# Patient Record
Sex: Female | Born: 1960 | Race: White | Hispanic: No | Smoking: Never smoker
Health system: Southern US, Community
[De-identification: ages and names within clinical notes are randomized; demographics above are authoritative.]

## PROBLEM LIST (undated history)

## (undated) DIAGNOSIS — F329 Major depressive disorder, single episode, unspecified: Secondary | ICD-10-CM

## (undated) DIAGNOSIS — M722 Plantar fascial fibromatosis: Secondary | ICD-10-CM

## (undated) DIAGNOSIS — N950 Postmenopausal bleeding: Secondary | ICD-10-CM

## (undated) DIAGNOSIS — K219 Gastro-esophageal reflux disease without esophagitis: Secondary | ICD-10-CM

## (undated) DIAGNOSIS — F419 Anxiety disorder, unspecified: Secondary | ICD-10-CM

## (undated) DIAGNOSIS — R609 Edema, unspecified: Secondary | ICD-10-CM

## (undated) DIAGNOSIS — N840 Polyp of corpus uteri: Secondary | ICD-10-CM

## (undated) DIAGNOSIS — H8110 Benign paroxysmal vertigo, unspecified ear: Secondary | ICD-10-CM

## (undated) DIAGNOSIS — IMO0001 Reserved for inherently not codable concepts without codable children: Secondary | ICD-10-CM

## (undated) DIAGNOSIS — D649 Anemia, unspecified: Secondary | ICD-10-CM

## (undated) DIAGNOSIS — R0600 Dyspnea, unspecified: Secondary | ICD-10-CM

## (undated) DIAGNOSIS — K625 Hemorrhage of anus and rectum: Secondary | ICD-10-CM

## (undated) DIAGNOSIS — K648 Other hemorrhoids: Secondary | ICD-10-CM

## (undated) DIAGNOSIS — G4700 Insomnia, unspecified: Secondary | ICD-10-CM

## (undated) DIAGNOSIS — K644 Residual hemorrhoidal skin tags: Secondary | ICD-10-CM

## (undated) DIAGNOSIS — D219 Benign neoplasm of connective and other soft tissue, unspecified: Secondary | ICD-10-CM

## (undated) DIAGNOSIS — F32A Depression, unspecified: Secondary | ICD-10-CM

## (undated) DIAGNOSIS — G43909 Migraine, unspecified, not intractable, without status migrainosus: Secondary | ICD-10-CM

## (undated) DIAGNOSIS — R7303 Prediabetes: Secondary | ICD-10-CM

## (undated) HISTORY — DX: Major depressive disorder, single episode, unspecified: F32.9

## (undated) HISTORY — PX: HYSTEROSCOPY: SHX211

## (undated) HISTORY — PX: OTHER SURGICAL HISTORY: SHX169

## (undated) HISTORY — DX: Reserved for inherently not codable concepts without codable children: IMO0001

## (undated) HISTORY — DX: Prediabetes: R73.03

## (undated) HISTORY — DX: Benign paroxysmal vertigo, unspecified ear: H81.10

## (undated) HISTORY — DX: Migraine, unspecified, not intractable, without status migrainosus: G43.909

## (undated) HISTORY — DX: Anxiety disorder, unspecified: F41.9

## (undated) HISTORY — DX: Insomnia, unspecified: G47.00

## (undated) HISTORY — DX: Plantar fascial fibromatosis: M72.2

## (undated) HISTORY — PX: DILATION AND CURETTAGE OF UTERUS: SHX78

## (undated) HISTORY — DX: Gastro-esophageal reflux disease without esophagitis: K21.9

## (undated) HISTORY — PX: COLONOSCOPY: SHX174

## (undated) HISTORY — DX: Depression, unspecified: F32.A

---

## 2002-05-16 ENCOUNTER — Emergency Department (HOSPITAL_COMMUNITY): Admission: EM | Admit: 2002-05-16 | Discharge: 2002-05-16 | Payer: Self-pay | Admitting: Emergency Medicine

## 2004-08-26 ENCOUNTER — Emergency Department (HOSPITAL_COMMUNITY): Admission: EM | Admit: 2004-08-26 | Discharge: 2004-08-26 | Payer: Self-pay | Admitting: Emergency Medicine

## 2007-05-17 ENCOUNTER — Emergency Department (HOSPITAL_COMMUNITY): Admission: EM | Admit: 2007-05-17 | Discharge: 2007-05-17 | Payer: Self-pay | Admitting: Emergency Medicine

## 2007-08-21 ENCOUNTER — Encounter: Payer: Self-pay | Admitting: Cardiovascular Disease

## 2007-10-09 ENCOUNTER — Ambulatory Visit (HOSPITAL_COMMUNITY): Admission: RE | Admit: 2007-10-09 | Discharge: 2007-10-09 | Payer: Self-pay | Admitting: Cardiovascular Disease

## 2007-10-09 ENCOUNTER — Ambulatory Visit: Payer: Self-pay | Admitting: Cardiovascular Disease

## 2007-10-21 ENCOUNTER — Ambulatory Visit: Payer: Self-pay | Admitting: Gastroenterology

## 2008-12-03 DIAGNOSIS — R0602 Shortness of breath: Secondary | ICD-10-CM | POA: Insufficient documentation

## 2008-12-03 DIAGNOSIS — R609 Edema, unspecified: Secondary | ICD-10-CM | POA: Insufficient documentation

## 2009-07-14 ENCOUNTER — Ambulatory Visit (HOSPITAL_COMMUNITY): Admission: RE | Admit: 2009-07-14 | Discharge: 2009-07-14 | Payer: Self-pay | Admitting: Family Medicine

## 2009-07-20 ENCOUNTER — Encounter (INDEPENDENT_AMBULATORY_CARE_PROVIDER_SITE_OTHER): Payer: Self-pay | Admitting: *Deleted

## 2009-08-22 ENCOUNTER — Ambulatory Visit: Payer: Self-pay | Admitting: Internal Medicine

## 2009-08-22 DIAGNOSIS — K648 Other hemorrhoids: Secondary | ICD-10-CM | POA: Insufficient documentation

## 2009-08-22 DIAGNOSIS — K219 Gastro-esophageal reflux disease without esophagitis: Secondary | ICD-10-CM | POA: Insufficient documentation

## 2009-08-22 DIAGNOSIS — R1310 Dysphagia, unspecified: Secondary | ICD-10-CM | POA: Insufficient documentation

## 2009-08-24 ENCOUNTER — Other Ambulatory Visit: Admission: RE | Admit: 2009-08-24 | Discharge: 2009-08-24 | Payer: Self-pay | Admitting: Obstetrics and Gynecology

## 2010-05-02 ENCOUNTER — Ambulatory Visit (HOSPITAL_COMMUNITY): Admission: RE | Admit: 2010-05-02 | Payer: Self-pay | Admitting: Family Medicine

## 2010-07-08 ENCOUNTER — Encounter: Payer: Self-pay | Admitting: Family Medicine

## 2010-07-18 NOTE — Letter (Signed)
Summary: New Patient letter  [facility]  [address] [place], [place] [address]   Phone: [phone]  Fax: [phone]       07/20/2009 MRN: [id number]  Adriana [name] [address] [place], [place]  [address]  Dear Adriana [name],  Welcome to the Gastroenterology Division at [facility].    You are scheduled to see Dr.  Marina Goodell on 08-22-09 at 9:30a.m. on the 3rd floor at [facility], [address]  We ask that you try to arrive at our office 15 minutes prior to your appointment time to allow for check-in.  We would like you to complete the enclosed self-administered evaluation form prior to your visit and bring it with you on the day of your appointment.  We will review it with you.  Also, please bring a complete list of all your medications or, if you prefer, bring the medication bottles and we will list them.  Please bring your insurance card so that we may make a copy of it.  If your insurance requires a referral to see a specialist, please bring your referral form from your primary care physician.  Co-payments are due at the time of your visit and may be paid by cash, check or credit card.     Your office visit will consist of a consult with your physician (includes a physical exam), any laboratory testing he/she may order, scheduling of any necessary diagnostic testing (e.g. x-ray, ultrasound, CT-scan), and scheduling of a procedure (e.g. Endoscopy, Colonoscopy) if required.  Please allow enough time on your schedule to allow for any/all of these possibilities.    If you cannot keep your appointment, please call [phone] to cancel or reschedule prior to your appointment date.  This allows [place] the opportunity to schedule an appointment for another patient in need of care.  If you do not cancel or reschedule by 5 p.m. the business day prior to your appointment date, you will be charged a $50.00 late cancellation/no-show fee.    Thank you for  choosing [facility] for your medical needs.  We appreciate the opportunity to care for you.  Please visit [place] at our website  to learn more about our practice.                     Sincerely,                                                             The Gastroenterology Division

## 2010-07-18 NOTE — Letter (Signed)
Summary: [facility] Instructions  [facility]  [address] [place], [place] [address]   Phone: [phone]  Fax: [phone]       Adriana [name]    Dec 02, 1960    MRN: [id number]        Procedure Day /Date:MONDAY, 09/26/09     Arrival Time:1:30 PM     Procedure Time:2:30 PM     Location of Procedure:                    [facility] (4th Floor)   PREPARATION FOR COLONOSCOPY WITH MOVIPREP/ENDO   Starting 5 days prior to your procedure 09/22/09 do not eat nuts, seeds, popcorn, corn, beans, peas,  salads, or any raw vegetables.  Do not take any fiber supplements (e.g. Metamucil, Citrucel, and Benefiber).  THE DAY BEFORE YOUR PROCEDURE         DATE: 09/25/09 DAY: SUNDAY  1.  Drink clear liquids the entire day-NO SOLID FOOD  2.  Do not drink anything colored red or purple.  Avoid juices with pulp.  No orange juice.  3.  Drink at least 64 oz. (8 glasses) of fluid/clear liquids during the day to prevent dehydration and help the prep work efficiently.  CLEAR LIQUIDS INCLUDE: Water Jello Ice Popsicles Tea (sugar ok, no milk/cream) Powdered fruit flavored drinks Coffee (sugar ok, no milk/cream) Gatorade Juice: apple, white grape, white cranberry  Lemonade Clear bullion, consomm, broth Carbonated beverages (any kind) Strained chicken noodle soup Hard Candy                             4.  In the morning, mix first dose of MoviPrep solution:    Empty 1 Pouch A and 1 Pouch B into the disposable container    Add lukewarm drinking water to the top line of the container. Mix to dissolve    Refrigerate (mixed solution should be used within 24 hrs)  5.  Begin drinking the prep at 5:00 p.m. The MoviPrep container is divided by 4 marks.   Every 15 minutes drink the solution down to the next mark (approximately 8 oz) until the full liter is complete.   6.  Follow completed prep with 16 oz of clear liquid of your choice (Nothing red or purple).   Continue to drink clear liquids until bedtime.  7.  Before going to bed, mix second dose of MoviPrep solution:    Empty 1 Pouch A and 1 Pouch B into the disposable container    Add lukewarm drinking water to the top line of the container. Mix to dissolve    Refrigerate  THE DAY OF YOUR PROCEDURE      DATE: 09/26/09 DAY: MONDAY  Beginning at 9:30 a.m. (5 hours before procedure):         1. Every 15 minutes, drink the solution down to the next mark (approx 8 oz) until the full liter is complete.  2. Follow completed prep with 16 oz. of clear liquid of your choice.    3. You may drink clear liquids until12:30 PM (2 HOURS BEFORE PROCEDURE).   MEDICATION INSTRUCTIONS  Unless otherwise instructed, you should take regular prescription medications with a small sip of water   as early as possible the morning of your procedure.           OTHER INSTRUCTIONS  You will need a responsible adult at least 48 years of age to  accompany you and drive you home.   This person must remain in the waiting room during your procedure.  Wear loose fitting clothing that is easily removed.  Leave jewelry and other valuables at home.  However, you may wish to bring a book to read or  an iPod/MP3 player to listen to music as you wait for your procedure to start.  Remove all body piercing jewelry and leave at home.  Total time from sign-in until discharge is approximately 2-3 hours.  You should go home directly after your procedure and rest.  You can resume normal activities the  day after your procedure.  The day of your procedure you should not:   Drive   Make legal decisions   Operate machinery   Drink alcohol   Return to work  You will receive specific instructions about eating, activities and medications before you leave.    The above instructions have been reviewed and explained to me by   _______________________    I fully understand and can verbalize these instructions  _____________________________ Date _________

## 2010-07-18 NOTE — Assessment & Plan Note (Signed)
Summary: dysphagia--ch.   History of Present Illness Visit Type: Initial Consult Primary GI MD: Yancey Flemings MD Primary Provider: Karleen Hampshire, MD Requesting Provider: Karleen Hampshire, MD Chief Complaint: Patient complains that it feels like something is stuck in her throat. She states that she was dx with this globus sensation in 1993 after her divorce but it was better for a short amount of time but has since got worse. She is states that she has some epigastric pain. The sensation wakes her from her sleep. She also complains of some hemorrhoids and some rectal bleeding from time to time.  History of Present Illness:   50 year old female with a history of anxiety/depression and chronic headaches. She presents today regarding globus sensation and dysphagia. Also, complains about hemorrhoids. She reports an 8 year history of a sensation of "something stuck in her throat" that occurs with or without meals. She was diagnosed previously with globus sensation felt secondary to anxiety. Problem resolved but has returned over the past year. In addition to this symptom, she reports several month history of acid reflux as manifested by pyrosis and chest discomfort as well as epigastric burning. Symptoms are exacerbated by certain food items. No obvious relieving factors. She does report mild intermittent solid food dysphagia to items such as [place] fries. No change in weight. Finally, problems with hemorrhoids as manifested by swelling, difficulty cleaning, and minor bleeding. Her father was diagnosed with colon cancer in his 49s. The patient has not been screened for colon cancer or undergone prior upper endoscopy.Marland Kitchen   GI Review of Systems    Reports abdominal pain, acid reflux, chest pain, and  dysphagia with solids.     Location of  Abdominal pain: epigastric area.    Denies belching, bloating, dysphagia with liquids, heartburn, loss of appetite, nausea, vomiting, vomiting blood, weight loss, and  weight  gain.      Reports hemorrhoids and  rectal bleeding.     Denies anal fissure, black tarry stools, change in bowel habit, constipation, diarrhea, diverticulosis, fecal incontinence, heme positive stool, irritable bowel syndrome, jaundice, light color stool, liver problems, and  rectal pain.    Current Medications (verified): 1)  Astragalus Root  Tab .... Take One Tablet By Mouth Once A Week 2)  Turmeric Tab .... Take One By Mouth As Needed 3)  Kelp 225 Mcg Tabs (Iodine (Kelp)) .... Take One By Mouth As Needed 4)  Aspirin 325 Mg Tabs (Aspirin) .... Take One By Mouth As Needed 5)  Excedrin Pm 500-38 Mg Tabs (Diphenhydramine-Apap (Sleep)) .... Take One By Mouth As Needed  Allergies (verified): No Known Drug Allergies  Past History:  Past Medical History: DYSPNEA (ICD-786.05) EDEMA (ICD-782.3)  Family Hx. of Colon Cancer insomina Anemia Anxiety Disorder headaches Depression  Past Surgical History: Unremarkable  Family History: Family History of Colon Cancer:Father Family History of Heart Disease: father Sister deceased of anersym  Social History: Full Time - city of [place] Divorced  Tobacco Use - No.  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  She is divorced. She has one dauhter.  She is, otherwise, fairly sedentary. She does not drink or smoke.   Review of Systems       The patient complains of allergy/sinus, anxiety-new, change in vision, depression-new, fatigue, headaches-new, shortness of breath, sleeping problems, swelling of feet/legs, thirst - excessive, urination - excessive, and voice change.  The patient denies anemia, arthritis/joint pain, back pain, blood in urine, breast changes/lumps, confusion, cough, coughing up  blood, fainting, fever, hearing problems, heart murmur, heart rhythm changes, itching, menstrual pain, muscle pains/cramps, night sweats, nosebleeds, pregnancy symptoms, skin rash, sore throat, swollen lymph glands, urination  changes/pain, urine leakage, and vision changes.    Vital Signs:  Patient profile:   50 year old female Height:      67 inches Weight:      245.4 pounds BMI:     38.57 Pulse rate:   88 / minute Pulse rhythm:   regular BP sitting:   100 / 60  (left arm) Cuff size:   regular  Vitals Entered By: Harlow Mares CMA Duncan Dull) (August 22, 2009 9:59 AM)  Physical Exam  General:  Well developed, well nourished, no acute distress. Head:  Normocephalic and atraumatic. Eyes:  PERRLA, no icterus. Ears:  Normal auditory acuity. Nose:  No deformity, discharge,  or lesions. Mouth:  No deformity or lesions, dentition normal. Neck:  Supple; no masses or thyromegaly. Lungs:  Clear throughout to auscultation. Heart:  Regular rate and rhythm; no murmurs, rubs,  or bruits. Abdomen:  Soft, nontender and nondistended. No masses, hepatosplenomegaly or hernias noted. Normal bowel sounds. Rectal:  deferred until colonoscopy Msk:  Symmetrical with no gross deformities. Normal posture. Pulses:  Normal pulses noted. Extremities:  No clubbing, cyanosis, edema or deformities noted. Neurologic:  Alert and  oriented x4;  grossly normal neurologically. Skin:  Intact without significant lesions or rashes. Cervical Nodes:  No significant cervical adenopathy.no supraclavicular adenopathy Psych:  Alert and cooperative. Normal mood and affect.   Impression & Recommendations:  Problem # 1:  GERD (ICD-530.81) several month history of pyrosis, epigastric and chest burning consistent with GERD.  Plan: #1. Reflux precautions #2. Literature on GERD and reflux precautions sheet provided #3. Prescribed Nexium 40 mg daily. In addition to the submitted prescription, samples provided  Problem # 2:  DYSPHAGIA UNSPECIFIED (ICD-787.20) the patient does have a globus type sensation. This may be secondary to GERD, or anxiety. She also has true intermittent esophageal dysphagia to solids. Rule out peptic stricture or esophageal  edema  Plan: #1. Upper endoscopy with possible esophageal dilation. The nature of the procedure as well as the risks, benefits, and alternatives were reviewed. She understood and agreed to proceed  Problem # 3:  HEMORRHOIDS, INTERNAL (ICD-455.0) problems with hemorrhoids.  Plan #1. Hemorrhoid care information sheet reviewed and provided  Problem # 4:  FM HX MALIGNANT NEOPLASM GASTROINTESTINAL TRACT (ICD-V16.0) family history of colon cancer in her father in his 64s.  Plan:  #1. Screening colonoscopy. The nature of the procedure as well as the risks, benefits, and alternatives were reviewed. She understood and agreed to proceed #2. Movi prep prescribed. The patient instructed on its use  Other Orders: Colon/Endo (Colon/Endo)  Patient Instructions: 1)  Nexium 40 mg samples and Rx. sent to your pharmacy #30 x 11 RFs. take 1 capsule by mouth once daily 2)  Colon/Endo scheduled [facility] 09/26/09 2:30 pm to arrive at 1:30 3)  Movi prep instructions given to patient and Rx. sent to pharmacy for patient to pick up. 4)  Colonoscopy and Flexible Sigmoidoscopy brochure given.  5)  GI Reflux brochure given. 6)  Hemorrhoids brochure given with rectal care instructions. 7)  Upper Endoscopy brochure given.  8)  The medication list was reviewed and reconciled.  All changed / newly prescribed medications were explained.  A complete medication list was provided to the patient / caregiver. 9)  Copy: Earma Reading [facility], [address], [place],  [place] [address]) Prescriptions: NEXIUM 40 MG CPDR (ESOMEPRAZOLE MAGNESIUM) 1 capsule by mouth once daily  #30 x 11   Entered by:   Milford Cage NCMA   Authorized by:   Hilarie Fredrickson MD   Signed by:   Milford Cage NCMA on 08/22/2009   Method used:   Electronically to        [facility]. [phone]* (retail)       [address] [place], [place]  [address]       Ph: [phone]       Fax: [phone]   RxID:    [id number] MOVIPREP 100 GM  SOLR (PEG-KCL-NACL-NASULF-NA ASC-C) As per prep instructions.  #1 x 0   Entered by:   Milford Cage NCMA   Authorized by:   Hilarie Fredrickson MD   Signed by:   Milford Cage NCMA on 08/22/2009   Method used:   Electronically to        [facility]. [phone]* (retail)       [address] [place]  [address]       Ph: [phone]       Fax: [phone]   RxID:   [id number]

## 2010-10-31 NOTE — Assessment & Plan Note (Signed)
[facility]                       [facility] OFFICE NOTE   Adriana [name], Adriana [name]                   MRN:          [id number]  DATE:10/09/2007                            DOB:          1960-10-27    A 47-year patient with chest pain, dyspnea, and lower extremity edema.   The patient has no documented previous cardiopulmonary problems.  For  about a year she has had dyspnea.  She has gained about 25 pounds.  However, when she walks with a girlfriend she feels she is more dyspneic  than she should be.  She also gets squeezing pain in her chest.  They  are usually related to exertion.   The patient has noticed some lower extremity edema as well.  She has  gained weight.  She does not watch her diet very well or her salt  intake.   There has been no history of chronic pulmonary disease.  She is a  nonsmoker with no evidence of previous asthma.  No cough, no sputum  production.  Her dyspnea has been progressive over the last year.   There has been no initial workup, and she tells me she has not had a  chest x-ray in a year.   In regards to her heart the squeezing pressure is associated with her  dyspnea.  There is no diaphoresis, no palpitations, and no syncope.   She tends not to get the squeezing pressure in her chest without the  dyspnea.   Again most of her symptoms seem to be exertional.   REVIEW OF SYSTEMS:  Otherwise, negative.   PAST MEDICAL HISTORY:  Benign.   PAST SURGICAL HISTORY:  She has not had previous surgery.   ALLERGIES:  She has no known allergies.   MEDICATIONS:  She does not take any medicines on a routine basis.   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  She is divorced.  She has one child 17 years ago.  She is, otherwise, fairly sedentary.  She does not drink or smoke.   FAMILY HISTORY:  Remarkable for father dying of colon cancer.  She did  have an older sister who had an aneurysm in her  head and died at the age  of 57.   PHYSICAL EXAMINATION:  GENERAL:  Remarkable for an overweight white  female in no distress.  VITAL SIGNS:  Weight 246, blood pressure 110/80, pulse 84 and regular,  respiratory rate 14, afebrile.  HEENT:  Unremarkable.  NECK:  Carotids normal without bruit.  No lymphadenopathy, thyromegaly,  JVP elevation.  LUNGS:  Clear, good diaphragmatic motion.  No wheezing, S1, S2 with  normal heart sounds.  PMI normal.  ABDOMEN:  Benign, bowel sounds positive.  No AAA, no tenderness.  No  hepatosplenomegaly, hepatojugular reflux.  Distal pulses are intact with  trace edema.  NEUROLOGICAL:  Nonfocal.  SKIN:  Warm and dry.  No muscle weakness.   EKG shows sinus rhythm with poor R wave progression and low voltage   IMPRESSION:  1. Dyspnea.  Check 2-D echocardiogram.  Given her low voltage rule out  right ventricular or left ventricular dysfunction or pericardial      effusion.  I need a baseline chest x-ray just to see what her chest      and lungs look like.  Her lung exam is normal.  There is no obvious      history of asthma or of pulmonary problems.  We will check a 2-D      echocardiogram as well as her chest x-ray as an initial screen for      cardiopulmonary causes of dyspnea.  2. Chest pain likely related to her shortness of breath, follow-up      stress Myoview.  This will allow me to see what her blood pressure      response to exercise is.  We may check her oxygen saturations when      she walks and have a further idea of any pulmonary hypertension by      echocardiogram.  3. Lower extremity edema functional.  Elevate legs at the end of the      day, low salt diet.  4. One of the major issues with the patient is need for weight loss.      She needs to see a nutritionist and counselor.  Further follow-up      testing such as CT scanning may be indicated.  However, for the      time being we will check her echocardiogram, chest x-ray, and       stress Myoview, and make recommendations after that.  I think the      chance of finding significant cardiopulmonary disease is low.     Noralyn Pick. Eden Emms, MD, [facility]  Electronically Signed    PCN/MedQ  DD: 10/09/2007  DT: 10/09/2007  Job #: [id number]   cc:   [facility], Lewayne Bunting

## 2010-10-31 NOTE — Consult Note (Signed)
NAME:  Adriana [name], Adriana [name]            ACCOUNT NO.:  [id number]   MEDICAL RECORD NO.:  [id number]          PATIENT TYPE:  AMB   LOCATION:  DAY                           FACILITY:  [facility]   PHYSICIAN:  R. Roetta Sessions, M.D. DATE OF BIRTH:  1960/11/29   DATE OF CONSULTATION:  10/21/2007  DATE OF DISCHARGE:                                 CONSULTATION   REASON FOR CONSULTATION:  Difficulty swallowing, anemia, hemorrhoids,  consult for colonoscopy.   PHYSICIAN REQUESTING CONSULTATION:  Dr. Bunnie Pion. PA with Dr. Reynolds Bowl.   HISTORY OF PRESENT ILLNESS:  The patient is a 50 year old Caucasian  female patient of  Dr. Jorene Guest who presents today for further  evaluation of above-stated symptoms.  She actually was seen by Dr. Loreta Ave  back in 2004 when she presented with constipation and hematochezia.  He  had recommended a colonoscopy; however, she cancelled.  She states she  was afraid she will not be able to tolerate the preop.  She has had  chronic constipation since that time.  She has been able to manage  fairly well with her diet.  If she does not consume a high fiber diet,  she has to take laxatives.  She has seen intermittent bright red blood,  but none recently.  She has a history of hemorrhoids, which have been  causing her some difficulty.  She also has been heavy menses over the  last couple of years, which last 2-3 days at a time and have been more  irregular lately.  She was recently found to have mild anemia with  hemoglobin of 11.1.  She reports being heme-negative on digital rectal  examination recently by Dr. Bunnie Pion, [facility], who is at [facility].  She denies any diarrhea.  Denies any abdominal pain.  She complains of chronic globus.  She feels like there is a lump in her  throat at all times.  She now has been having more symptoms of food  getting stuck, lay down, however.  She denies any chronic GERD.  No  chronic nausea or vomiting, although she has  had some nausea  intermittently with headache over last couple of years.   We have not received any labs from her primary care; however, they  reported on referal form that her hemoglobin was 11.1, hematocrit 32.8.  She also has a family history of colorectal cancer with the father pass  away in his middle-to-late 74s with colon cancer.   CURRENT MEDICATIONS:  1. Aspirin p.r.n.  2. Multivitamin daily.  3. Magnesium p.r.n.  4. Kelp p.r.n.   ALLERGIES:  No known drug allergies.   PAST MEDICAL HISTORY:  Chronic insomnia.   PAST SURGICAL HISTORY:  Negative.   FAMILY HISTORY:  As above and in addition, her sister died at age 48 due  to a ruptured brain aneurysm.  She has 2 brothers who are in good  health.  She also has a first cousin who had been treated for carcinoma  at age 64.  First cousin had colon cancer at age 44 and is doing well.  This  is on her father's side family.   SOCIAL HISTORY:  She is divorced.  She has 1 daughter.  She works for  the city of [employer].  She has never been a smoker.  Regularly drinks  alcohol.   REVIEW OF SYSTEMS:  See HPI for GI.  CONSTITUTIONAL:  No weight loss.  CARDIOPULMONARY:  No chest pain.  She  states she recently had some shortness of breath and was supposed to  have a stress test, but she canceled this due to fear of nuclear  medicine portion of the study.   PHYSICAL EXAM:  VITAL SIGNS:  Weight 245, height 5.6-1/2 inches,  temperature 97.5, blood pressure 112/72, and pulse 64.  GENERAL:  Pleasant, obese Caucasian female in no acute distress.  SKIN:  Warm and dry.  No jaundice.  HEENT:  Sclerae nonicteric.  Oropharyngeal mucosa moist and pink.  No  lesions, erythema, or exudate. No lymphadenopathy, thyromegaly.  CHEST: Lungs are clear to auscultation.  CARDIAC:  Reveals regular rate and rhythm.  No murmurs, rubs, or  gallops.  ABDOMEN:  Positive bowel sounds.  Soft, nontender, and nondistended.  No  organomegaly or masses.  No  rebound or guarding.  No abdominal bruits or  hernias.  LOWER EXTREMITIES:  No edema.  RECTAL:  Recently done by Bunnie Pion heme-negative for patient.   IMPRESSION:  The patient is a 50 year old lady with chronic constipation  and chronic intermittent hematochezia with mild anemia who presents for  consideration of colonoscopy.  She has family history with first-degree  relatives diagnosed with colon cancer in the 29s and second-degree  relative diagnosed in the 30s.  She has never had a colonoscopy, so  wanted to just have it  for diagnostic and screening purposes.  In  addition, she complains of chronic globus and recent sensation of  esophageal dysphagia.  We would pursue EGD at the same time.   PLAN:  1. Colonoscopy and EGD with ED in the near future with Dr. Sharon Seller.  2. She will hold aspirin 4 days prior to procedure.  3. Further recommendations to follow.   I would like to thank Bunnie Pion for allowing [place] to take part in the  care of this patient.      Tana Coast, P.AJonathon Bellows, M.D.  Electronically Signed    LL/MEDQ  D:  10/21/2007  T:  10/22/2007  Job:  [id number]   cc:   Adriana [name], M.D.

## 2010-11-03 NOTE — Consult Note (Signed)
NAME:  Adriana [name], Adriana [name] NO.:  [id number]   MEDICAL RECORD NO.:  [id number]                  PATIENT TYPE:   LOCATION:                                       FACILITY:   PHYSICIAN:  Adriana [name], M.D.                 DATE OF BIRTH:  [birth year]   DATE OF CONSULTATION:  DATE OF DISCHARGE:                                   CONSULTATION   CONSULTING PHYSICIAN:  Adriana [name], M.D.   REASON FOR CONSULTATION:  Constipation, hematochezia and GE reflux.   HISTORY OF PRESENT ILLNESS:  Adriana [name] is a 50 year old Caucasian female who  was referred through courtesy of Dr. Juanetta [name] for GI evaluation.  She  presents with at least a 15 year history of constipation.  She is having  increasing difficulty with her bowel movements.  She may go as long as a  week without a BM.  Over the years, she has tried various fiber preparations  as well as stool softeners and [employer] Colon Cleanser but did not have good  results.  With time, she will find herself using more and more of this  medication.  She states she has tried even a whole can of prunes but without  any benefit.  Lately she has been taking 1-2 Dulcolax once a week or [name].  She has intermittent hematochezia.  She generally notices a small amount of  blood on the tissue.  She states a friend suggested acidophilus which she  started one week ago and she feels it might be helping her.  She also  complains of throat irritation and regurgitation over the last few days and  she has had intermittent retrosternal pain.  She denies dyspnea, palpation  or lightheadedness.  In the past, she has been diagnosed with globus, felt  to be related to stress.  She has never been diagnosed with GERD, in the  past.  She has a good appetite.  Her weight has been stable.  She denies  nausea, vomiting, hoarseness, chronic cough.   1. She is on Ambien 5 mg q.h.s. p.r.n.  2. Dulcolax 1-2 tablets once a week p.r.n.  3. Acidophilus one  b.i.d.   PAST MEDICAL HISTORY:  History of insomnia for 12 years.   PAST SURGICAL HISTORY:  She has never had any surgeries.   FAMILY HISTORY:  Mother is in fair health.  Father has CAD and died of colon  carcinoma at age 37 or 78, within three months of diagnosis.  One first  cousin also has been treated for carcinoma at age 66 and doing fine.  One  sister died of a ruptured intracranial aneurism at age 20 and two brothers  are in good health.   SOCIAL HISTORY:  She is divorced.  She has one daughter.  She works part  time for the city of [employer].  She has never smoked cigarettes and drinks  alcohol occasionally.  PHYSICAL EXAMINATION:  GENERAL:  Pleasant, mildly obese, Caucasian female  who is in no acute distress.  VITAL SIGNS:  She weighs 280.5 pounds.  She is 5 feet 6.5 inches tall.  Pulse 72 per minute, blood pressure 130/74, temp is 97.9.  HEENT:  Conjunctivae pink.  Sclerae nonicteric.  Oropharyngeal mucosa is  normal.  NECK:  Without masses or thyromegaly.  CARDIAC:  Regular rhythm.  Normal S1, S2.  No murmur or gallop noted.  LUNGS:  Clear to auscultation.  ABDOMEN:  Symmetrical with normal bowel sounds.  On palpation it is soft  with mild tenderness across the lower half of the abdomen.  No organomegaly  or masses noted.  RECTAL:  Revealed no external abnormalities.  She has formed stool in the  vault which is guaiac negative.  EXTREMITIES:  No peripheral edema or clubbing noted.   ASSESSMENT:  1. Adriana [name] is a 50 year old Caucasian female with intermittent throat     irritation, regurgitation who also experienced chest pain over the last     two days.  She does not have any palpitations, dyspnea or     lightheadedness.  She does not have any risk factors for cardiac disease.     I feel her atypical chest pain is secondary to gastroesophageal reflux     disease.  2. Chronic constipation.  Increasing difficulty in having bowel movements     and intermittent  hematochezia.  I suspect hematochezia secondary to     hemorrhoids and she has a colonic dysmotility.  Her family history is     positive for colon carcinoma and she needs to be screened for it.   RECOMMENDATIONS:  1. Antireflux measures.  2. Aciphex 20 mg p.o. q.a.m., samples given.  3. Total colonoscopy to be performed at [facility] in two to three     weeks.  4. She is to continue high fiber diet.  She should take Citrucel 1     tablespoonful daily and Lactulose 1-2 tablespoons q.h.s.  Prescription     given for _______ with refill.  5. Her response to therapy will be assessed at the time of colonoscopy.   I would like to thank Dr. Juanetta [name] for the opportunity to participate in the  care of this nice lady.                                                 Adriana [name], M.D.    NR/MEDQ  D:  02/24/2003  T:  02/25/2003  Job:  [id number]   cc:   Ramon Dredge L. Adriana [name], M.D.  [address]  [place]  [place] [address]  Fax: [phone]

## 2010-11-03 NOTE — Consult Note (Signed)
Adriana [name], HOMEN NO.:  [id number]   MEDICAL RECORD NO.:  [id number]          PATIENT TYPE:  EMS   LOCATION:  ED                            FACILITY:  [facility]   PHYSICIAN:  Langley Gauss, MD     DATE OF BIRTH:  1960-08-10   DATE OF CONSULTATION:  DATE OF DISCHARGE:                                   CONSULTATION   HISTORY:  This is a 50 year old gravida 2, para 1 with a last menstrual  period of 06/13/05 which would place her at about [redacted] weeks gestation.  The  patient states she had a positive home pregnancy test about three weeks  previously.  For about two days duration now, she has had onset of markedly-  increased pelvic cramping like menstrual type cramps.  She was actually  working a shift today at work, and immediately after the shift ended, she  presented due to increased intensity associated with the uterine  contractions.  Although she has been aware of the pregnancy, she has not had  any prenatal care to date.   Upon presentation to the emergency room, the patient actually began having  onset of bleeding, passage of clots and passage of what was described as  tissue.  The emergency room physician caring for the patient was Dr. Carren Rang.  He stated that when he evaluated her, she was noted to have  moderately active bleeding, and some tissue still present at the end of the  cervical os.  At that point in time, I was consulted to continue with  complete management and care of this pregnancy.   PAST MEDICAL HISTORY:  The patient is actually OB unassigned at this time.  She has a 47 year old daughter delivered vaginally.  It sounds as though she  has had no significant gynecological care since that point in time.   REVIEW OF SYSTEMS:  Pertinent for menstrual periods, three days of flow,  fairly regular and predictable, but on these three days, she does have  passage of small clots.  She has not been on any birth control, and is  sexually  active.   CURRENT MEDICATIONS:  Intermittent use of prenatal vitamins only.  The  patient otherwise states that she prefers to take herbs over prescription  drugs.   SOCIAL HISTORY:  The patient is employed as an Airline pilot for [employer].   PHYSICAL EXAMINATION:  GENERAL:  Reveals a very-pleasant white female.  Blood pressure is 128/70, pulse rate 80, respiratory rate 20.  HEENT:  Negative.  No adenopathy.  NECK:  Supple.  Thyroid is nonpalpable.  LUNGS:  Clear.  CARDIOVASCULAR:  Regular rate and rhythm.  ABDOMEN:  Soft, nontender.  No surgical scars are identified.  No  significant pelvic masses.  EXTREMITIES:  Normal.  PELVIC:  Exam is normal.  External genitalia, some bleeding noted on the  inner thighs.  Sterile speculum examination is performed.  As described by  the emergency room physician, tissue is present at the end of the cervical  os which does have the appearance of a trophoblastic/ placental tissue.  Cervix is dilated 1  cm.  A sponge stick is used to grasp this which is  easily removed in two large sections.  Gentle probing done of the lower  uterine segment with the sponge stick results in no further tissue obtained.  The patient does state that the pelvic cramping markedly abated after  performing this procedure.   LABORATORY STUDIES:  The patient is noted to be Rh negative blood type.  She  is receiving RhoGAM injection during this hospitalization.  A quantitative  beta hCG was performed which is the results of only 1242.   A transabdominal ultrasound less than [redacted] weeks gestation performed by Dr.  Langley Gauss does reveal a markedly thickened endometrium which appears to  contain clotted blood, no fetus is identified within the uterine cavity.  Other possibilities, she does have intrauterine polyps or submucosal  leiomyomas.   ASSESSMENT/PLAN:  I offered the patient several options.  My first choice  which was to continue her on observation status throughout the  night and  proceed with D&C if bleeding and passage of tissue continues.  The patient  otherwise prefers to be managed as an outpatient.  She sounds to be very  reliable.  I advised her to re-present should onset of heavy vaginal  bleeding or passage of tissue recurs.  She is likewise aware that she would  need to follow up with a practitioner of her choice during the next week.  She will continue to follow a quantitative beta hCG for resolution, and to  evaluate the bleeding clinically, and possibly repeating an ultrasound to  confirm complete spontaneous abortion.  Initially reluctant to receive the  RhoGAM after I discussed it with her.  She did proceed with RhoGAM  injection.   She is now discharged home at this time.  She is given a note to be out of  work tomorrow, and also given the RhoGAM and a prescription for Lortab  10/500, for pain relief.      DC/MEDQ  D:  08/26/2004  T:  08/27/2004  Job:  [id number]

## 2011-03-27 LAB — URINALYSIS, ROUTINE W REFLEX MICROSCOPIC
Bilirubin Urine: NEGATIVE
Glucose, UA: NEGATIVE
Hgb urine dipstick: NEGATIVE
Ketones, ur: NEGATIVE
Nitrite: NEGATIVE
Protein, ur: NEGATIVE
Specific Gravity, Urine: >1.03 — ABNORMAL HIGH
Urobilinogen, UA: 0.2
pH: 5.5

## 2011-03-27 LAB — URINE MICROSCOPIC-ADD ON

## 2012-01-24 ENCOUNTER — Other Ambulatory Visit (HOSPITAL_COMMUNITY): Payer: Self-pay | Admitting: Physician Assistant

## 2012-01-24 DIAGNOSIS — Z139 Encounter for screening, unspecified: Secondary | ICD-10-CM

## 2012-01-31 ENCOUNTER — Ambulatory Visit (HOSPITAL_COMMUNITY): Payer: Self-pay

## 2012-09-18 ENCOUNTER — Ambulatory Visit (INDEPENDENT_AMBULATORY_CARE_PROVIDER_SITE_OTHER): Payer: Self-pay | Admitting: Otolaryngology

## 2012-10-09 ENCOUNTER — Ambulatory Visit (INDEPENDENT_AMBULATORY_CARE_PROVIDER_SITE_OTHER): Payer: BC Managed Care – PPO | Admitting: Otolaryngology

## 2012-10-09 DIAGNOSIS — R490 Dysphonia: Secondary | ICD-10-CM

## 2012-10-09 DIAGNOSIS — K219 Gastro-esophageal reflux disease without esophagitis: Secondary | ICD-10-CM

## 2012-10-09 DIAGNOSIS — H9209 Otalgia, unspecified ear: Secondary | ICD-10-CM

## 2012-10-10 ENCOUNTER — Encounter: Payer: Self-pay | Admitting: Family Medicine

## 2012-10-10 ENCOUNTER — Ambulatory Visit (INDEPENDENT_AMBULATORY_CARE_PROVIDER_SITE_OTHER): Payer: BC Managed Care – PPO | Admitting: Family Medicine

## 2012-10-10 ENCOUNTER — Ambulatory Visit: Payer: Self-pay | Admitting: Family Medicine

## 2012-10-10 VITALS — BP 126/94 | Temp 99.4°F | Wt 259.2 lb

## 2012-10-10 DIAGNOSIS — I889 Nonspecific lymphadenitis, unspecified: Secondary | ICD-10-CM

## 2012-10-10 MED ORDER — AZITHROMYCIN 250 MG PO TABS: ORAL_TABLET | ORAL | Status: DC

## 2012-10-10 NOTE — Progress Notes (Signed)
  Subjective:    Patient ID: Adriana [name], female    DOB: Jul 31, 1960, 52 y.o.   MRN: [id number]  Sore Throat  This is a new problem. The current episode started in the past 7 days. The problem has been gradually worsening. The pain is worse on the right side. The maximum temperature recorded prior to her arrival was 100 - 100.9 F. The fever has been present for 1 to 2 days. The pain is at a severity of 4/10. Associated symptoms include congestion and ear pain. She has had no exposure to strep or mono.  Otalgia    patient notes discomfort in both ears.    Review of Systems  HENT: Positive for ear pain and congestion.        Objective:   Physical Exam   Alert moderate malaise. Vitals reviewed. Low-grade fever. Blood pressure improved on repeat 126/82. Lungs clear. Heart regular rate and rhythm. HEENT moderate nasal congestion frontal tenderness. Erythematous of throat     Assessment & Plan:  Impression sinusitis with element of pharyngitis. Plan Z-Pak. Symptomatic care discussed. WSL

## 2012-10-10 NOTE — Patient Instructions (Signed)
Ibuprofen 400-600 mg every six hours s needed. Increase fluid intake

## 2012-10-22 ENCOUNTER — Encounter: Payer: Self-pay | Admitting: *Deleted

## 2012-12-21 ENCOUNTER — Emergency Department (HOSPITAL_COMMUNITY): Payer: BC Managed Care – PPO

## 2012-12-21 ENCOUNTER — Encounter (HOSPITAL_COMMUNITY): Payer: Self-pay | Admitting: *Deleted

## 2012-12-21 ENCOUNTER — Emergency Department (HOSPITAL_COMMUNITY)
Admission: EM | Admit: 2012-12-21 | Discharge: 2012-12-21 | Disposition: A | Discharge status: Home / Self Care | Payer: BC Managed Care – PPO | Attending: Emergency Medicine | Admitting: Emergency Medicine

## 2012-12-21 DIAGNOSIS — Z3202 Encounter for pregnancy test, result negative: Secondary | ICD-10-CM | POA: Insufficient documentation

## 2012-12-21 DIAGNOSIS — R42 Dizziness and giddiness: Secondary | ICD-10-CM | POA: Insufficient documentation

## 2012-12-21 DIAGNOSIS — Z8679 Personal history of other diseases of the circulatory system: Secondary | ICD-10-CM | POA: Insufficient documentation

## 2012-12-21 DIAGNOSIS — G4700 Insomnia, unspecified: Secondary | ICD-10-CM | POA: Insufficient documentation

## 2012-12-21 DIAGNOSIS — Z8659 Personal history of other mental and behavioral disorders: Secondary | ICD-10-CM | POA: Insufficient documentation

## 2012-12-21 DIAGNOSIS — Z8739 Personal history of other diseases of the musculoskeletal system and connective tissue: Secondary | ICD-10-CM | POA: Insufficient documentation

## 2012-12-21 DIAGNOSIS — Z8719 Personal history of other diseases of the digestive system: Secondary | ICD-10-CM | POA: Insufficient documentation

## 2012-12-21 LAB — URINALYSIS, ROUTINE W REFLEX MICROSCOPIC
Bilirubin Urine: NEGATIVE
Glucose, UA: NEGATIVE mg/dL
Ketones, ur: 40 mg/dL — AB
Leukocytes, UA: NEGATIVE
Protein, ur: NEGATIVE mg/dL
pH: 7 (ref 5.0–8.0)

## 2012-12-21 LAB — CBC WITH DIFFERENTIAL/PLATELET
Basophils Absolute: 0 10*3/uL (ref 0.0–0.1)
Basophils Relative: 0 % (ref 0–1)
Eosinophils Relative: 0 % (ref 0–5)
HCT: 38.2 % (ref 36.0–46.0)
Hemoglobin: 12.9 g/dL (ref 12.0–15.0)
MCH: 30.1 pg (ref 26.0–34.0)
MCHC: 33.8 g/dL (ref 30.0–36.0)
MCV: 89.3 fL (ref 78.0–100.0)
Monocytes Absolute: 0.4 10*3/uL (ref 0.1–1.0)
Monocytes Relative: 6 % (ref 3–12)
Neutro Abs: 6.5 10*3/uL (ref 1.7–7.7)
RDW: 13.8 % (ref 11.5–15.5)

## 2012-12-21 LAB — COMPREHENSIVE METABOLIC PANEL
Albumin: 4 g/dL (ref 3.5–5.2)
BUN: 11 mg/dL (ref 6–23)
CO2: 26 mEq/L (ref 19–32)
Calcium: 9.5 mg/dL (ref 8.4–10.5)
Chloride: 101 mEq/L (ref 96–112)
Creatinine, Ser: 0.72 mg/dL (ref 0.50–1.10)
GFR calc non Af Amer: >90 mL/min (ref >90)
Total Bilirubin: 0.4 mg/dL (ref 0.3–1.2)

## 2012-12-21 LAB — URINE MICROSCOPIC-ADD ON

## 2012-12-21 LAB — POCT PREGNANCY, URINE: Preg Test, Ur: NEGATIVE

## 2012-12-21 MED ORDER — ONDANSETRON HCL 4 MG/2ML IJ SOLN
4.0000 mg | Freq: Once | INTRAMUSCULAR | Status: AC
  Administered 2012-12-21: 4 mg via INTRAVENOUS

## 2012-12-21 MED ORDER — PROMETHAZINE HCL 25 MG RE SUPP: 25.0000 mg | Freq: Four times a day (QID) | RECTAL | Status: DC | PRN

## 2012-12-21 MED ORDER — ONDANSETRON HCL 4 MG/2ML IJ SOLN
4.0000 mg | Freq: Once | INTRAMUSCULAR | Status: AC
  Administered 2012-12-21: 4 mg via INTRAVENOUS
  Filled 2012-12-21: qty 2

## 2012-12-21 MED ORDER — SODIUM CHLORIDE 0.9 % IV SOLN
1000.0000 mL | Freq: Once | INTRAVENOUS | Status: AC
  Administered 2012-12-21: 1000 mL via INTRAVENOUS

## 2012-12-21 MED ORDER — ONDANSETRON HCL 4 MG/2ML IJ SOLN
INTRAMUSCULAR | Status: AC
  Administered 2012-12-21: 4 mg via INTRAVENOUS
  Filled 2012-12-21: qty 2

## 2012-12-21 MED ORDER — MECLIZINE HCL 25 MG PO TABS: ORAL_TABLET | ORAL | Status: DC

## 2012-12-21 NOTE — ED Provider Notes (Signed)
History    CSN: [id number] Arrival date & time 12/21/12  1735  First MD Initiated Contact with Patient 12/21/12 1808     Chief Complaint  Patient presents with  . Nausea  . Emesis   (Consider location/radiation/quality/duration/timing/severity/associated sxs/prior Treatment) Patient is a 52 y.o. female presenting with vomiting. The history is provided by the patient (the pt complains of vomiting and dizziness).  Emesis Severity:  Moderate Timing:  Constant Quality:  Bilious material Able to tolerate:  Liquids Progression:  Unchanged Chronicity:  New Recent urination:  Normal Associated symptoms: no abdominal pain, no diarrhea and no headaches    Past Medical History  Diagnosis Date  . Insomnia   . Anxiety   . Depression   . Migraine   . Reflux   . Plantar fasciitis    History reviewed. No pertinent past surgical history. Family History  Problem Relation Age of Onset  . Cancer Father     Colon  . Hypertension Father   . Heart attack Father    History  Substance Use Topics  . Smoking status: Never Smoker   . Smokeless tobacco: Not on file  . Alcohol Use: No   OB History   Grav Para Term Preterm Abortions TAB SAB Ect Mult Living                 Review of Systems  Constitutional: Negative for appetite change and fatigue.  HENT: Negative for congestion, sinus pressure and ear discharge.   Eyes: Negative for discharge.  Respiratory: Negative for cough.   Cardiovascular: Negative for chest pain.  Gastrointestinal: Positive for vomiting. Negative for abdominal pain and diarrhea.  Genitourinary: Negative for frequency and hematuria.  Musculoskeletal: Negative for back pain.  Skin: Negative for rash.  Neurological: Positive for dizziness and light-headedness. Negative for seizures and headaches.  Psychiatric/Behavioral: Negative for hallucinations.    Allergies  Review of patient's allergies indicates no known allergies.  Home Medications   Current  Outpatient Rx  Name  Route  Sig  Dispense  Refill  . meclizine (ANTIVERT) 25 MG tablet      One every 6 hours for dizziness   20 tablet   0   . promethazine (PHENERGAN) 25 MG suppository   Rectal   Place 1 suppository (25 mg total) rectally every 6 (six) hours as needed for nausea.   12 each   0    BP 138/87  Pulse 74  Temp(Src) 98.5 F (36.9 C) (Oral)  Resp 20  SpO2 99%  LMP 08/21/2012 Physical Exam  Constitutional: She is oriented to person, place, and time. She appears well-developed.  HENT:  Head: Normocephalic.  Eyes: Conjunctivae and EOM are normal. No scleral icterus.  Neck: Neck supple. No thyromegaly present.  Cardiovascular: Normal rate and regular rhythm.  Exam reveals no gallop and no friction rub.   No murmur heard. Pulmonary/Chest: No stridor. She has no wheezes. She has no rales. She exhibits no tenderness.  Abdominal: She exhibits no distension. There is no tenderness. There is no rebound.  Musculoskeletal: Normal range of motion. She exhibits no edema.  Lymphadenopathy:    She has no cervical adenopathy.  Neurological: She is oriented to person, place, and time. Coordination normal.  Pt became dizzy with movement  Skin: No rash noted. No erythema.  Psychiatric: She has a normal mood and affect. Her behavior is normal.    ED Course  Procedures (including critical care time) Labs Reviewed  URINALYSIS, ROUTINE W REFLEX MICROSCOPIC -  Abnormal; Notable for the following:    Hgb urine dipstick TRACE (*)    Ketones, ur 40 (*)    All other components within normal limits  CBC WITH DIFFERENTIAL - Abnormal; Notable for the following:    Neutrophils Relative % 81 (*)    All other components within normal limits  COMPREHENSIVE METABOLIC PANEL - Abnormal; Notable for the following:    Glucose, Bld 142 (*)    All other components within normal limits  URINE MICROSCOPIC-ADD ON - Abnormal; Notable for the following:    Squamous Epithelial / LPF MANY (*)     Bacteria, UA FEW (*)    All other components within normal limits  POCT PREGNANCY, URINE   Ct Head Wo Contrast  12/21/2012   *RADIOLOGY REPORT*  Clinical Data: ER patient with nausea, vomiting, and weakness.  CT HEAD WITHOUT CONTRAST  Technique:  Contiguous axial images were obtained from the base of the skull through the vertex without contrast.  Comparison: None.  Findings: The ventricles and sulci are symmetrical without significant effacement, displacement, or dilatation. No mass effect or midline shift. No abnormal extra-axial fluid collections. The grey-white matter junction is distinct. Basal cisterns are not effaced. No acute intracranial hemorrhage. No depressed skull fractures.  Visualized paranasal sinuses and mastoid air cells are not opacified.  IMPRESSION: No acute intracranial abnormalities.   Original Report Authenticated By: Burman Nieves, M.D.   1. Vertigo     MDM  Vertigo,  Pt improved with tx  Benny Lennert, MD 12/21/12 2155

## 2012-12-21 NOTE — ED Notes (Signed)
Discharge instructions given and reviewed with patient.  Prescriptions given for Meclizine and Phenergan; effects and use explained.  Patient verbalized understanding to follow up with PMD in 2-3 days.  Patient discharged home in good condition via wheelchair.

## 2012-12-21 NOTE — ED Notes (Signed)
Patient given Ginger Ale per request. 

## 2012-12-21 NOTE — ED Notes (Signed)
Smelled strong chemical at work on Thursday, became dizzy and nauseated.  Vomited once that night. Subsided afterward.  Since Saturday has been unable to keep anything down and continuing to be dizzy intermittently.

## 2012-12-25 ENCOUNTER — Encounter: Payer: Self-pay | Admitting: Family Medicine

## 2012-12-25 ENCOUNTER — Ambulatory Visit (INDEPENDENT_AMBULATORY_CARE_PROVIDER_SITE_OTHER): Payer: BC Managed Care – PPO | Admitting: Family Medicine

## 2012-12-25 VITALS — BP 112/78 | Temp 98.1°F | Wt 259.0 lb

## 2012-12-25 DIAGNOSIS — H8110 Benign paroxysmal vertigo, unspecified ear: Secondary | ICD-10-CM | POA: Insufficient documentation

## 2012-12-25 DIAGNOSIS — R42 Dizziness and giddiness: Secondary | ICD-10-CM

## 2012-12-25 MED ORDER — ONDANSETRON 4 MG PO TBDP: 4.0000 mg | ORAL_TABLET | Freq: Four times a day (QID) | ORAL | Status: DC | PRN

## 2012-12-25 NOTE — Progress Notes (Signed)
  Subjective:    Patient ID: Adriana [name], female    DOB: [birth year], 52 y.o.   MRN: [id number]  HPI  Had a bad spell of dizziness. Odor at work.  Felt bad. Went to er. Unsteady. Felt odd. Nausea and very unsteady.  Felt nauseated trougle with diminished enrgy  vom frequently. Patient she required fluids for dehydration. Had a spinning sensation. No history of this in the past. No significant headache.  Please see prior notes. Still occasionally has full sensation in the throat but overall has improved.   Review of Systems No further vomiting no chest pain no abdominal pain no weight loss    Objective:   Physical Exam  Alert hydration good. TMs normal. Extra canal normal. Neck supple. Lungs clear. Heart regular rate and rhythm. Neuro intact. Cerebellar function normal. Finger to nose normal.      Assessment & Plan:  Impression in her ear dysfunction with acute vertigo discussed at great length. ER records reviewed at length. Plan 25 minutes spent most in discussion. Hold off ENT consult for now. Did have negative CT scan. Add Zofran when necessary for nausea. Maintain Antivert. Work excuse this week warning signs discussed. WSL

## 2013-01-01 ENCOUNTER — Ambulatory Visit (INDEPENDENT_AMBULATORY_CARE_PROVIDER_SITE_OTHER): Payer: BC Managed Care – PPO | Admitting: Otolaryngology

## 2013-02-12 ENCOUNTER — Ambulatory Visit (INDEPENDENT_AMBULATORY_CARE_PROVIDER_SITE_OTHER): Payer: BC Managed Care – PPO | Admitting: Otolaryngology

## 2013-04-16 ENCOUNTER — Ambulatory Visit (INDEPENDENT_AMBULATORY_CARE_PROVIDER_SITE_OTHER): Payer: BC Managed Care – PPO | Admitting: Otolaryngology

## 2013-08-18 ENCOUNTER — Telehealth: Payer: Self-pay | Admitting: Family Medicine

## 2013-08-18 NOTE — Telephone Encounter (Signed)
Patient needs Rx for generic vestura to [facility]

## 2013-08-18 NOTE — Telephone Encounter (Signed)
Please call patient when complete °

## 2013-08-18 NOTE — Telephone Encounter (Signed)
I do not see this on her med list or in her chart. I put her chart in your pile.

## 2013-08-24 NOTE — Telephone Encounter (Signed)
This is similar to yasmin. I don't see [place] prescribing in the past--did pt mean to send this to her gyn? Maybe we started in past when on paper chart? Don't know

## 2013-08-24 NOTE — Telephone Encounter (Signed)
Left message on voicemail to return call.

## 2013-08-26 NOTE — Telephone Encounter (Signed)
This message was suppose to be on Adriana [name], which is Adriana [name]'s daughter. [place] called and wanted a refill on her medication not [place]. Medication was sent to pharmacy. Will call and notify the correct patient. Please disregard this message in the wrong chart.

## 2013-08-26 NOTE — Telephone Encounter (Signed)
ok 

## 2013-09-01 ENCOUNTER — Ambulatory Visit (INDEPENDENT_AMBULATORY_CARE_PROVIDER_SITE_OTHER): Payer: BC Managed Care – PPO | Admitting: Gastroenterology

## 2013-09-01 ENCOUNTER — Encounter (INDEPENDENT_AMBULATORY_CARE_PROVIDER_SITE_OTHER): Payer: Self-pay

## 2013-09-01 ENCOUNTER — Other Ambulatory Visit: Payer: Self-pay | Admitting: Gastroenterology

## 2013-09-01 ENCOUNTER — Telehealth: Payer: Self-pay

## 2013-09-01 ENCOUNTER — Encounter: Payer: Self-pay | Admitting: Gastroenterology

## 2013-09-01 VITALS — BP 131/84 | HR 94 | Temp 98.4°F | Wt 262.2 lb

## 2013-09-01 DIAGNOSIS — K625 Hemorrhage of anus and rectum: Secondary | ICD-10-CM

## 2013-09-01 DIAGNOSIS — Z1211 Encounter for screening for malignant neoplasm of colon: Secondary | ICD-10-CM

## 2013-09-01 DIAGNOSIS — K648 Other hemorrhoids: Secondary | ICD-10-CM

## 2013-09-01 NOTE — Patient Instructions (Addendum)
FOLLOW A HIGH FIBER DIET. AVOID ITEMS THAT CAUSE BLOATING AND GAS. SEE INFO BELOW.  DRINK WATER TO KEEP YOUR URINE LIGHT YELLOW.  SIT FOR LESS THAN 5 MINUTES ON THE COMMODE.  SCREENING COLONOSCOPY IN 3 WEEKS WITH PREPOPIK. YOU MAY HAVE A FULL LIQUID DIET ON THE DAY PRIOR TO YOUR COLONOSCOPY.  FOLLOW UP IN 6 WEEKS.    High-Fiber Diet A high-fiber diet changes your normal diet to include more whole grains, legumes, fruits, and vegetables. Changes in the diet involve replacing refined carbohydrates with unrefined foods. The calorie level of the diet is essentially unchanged. The Dietary Reference Intake (recommended amount) for adult males is 38 grams per day. For adult females, it is 25 grams per day. Pregnant and lactating women should consume 28 grams of fiber per day. Fiber is the intact part of a plant that is not broken down during digestion. Functional fiber is fiber that has been isolated from the plant to provide a beneficial effect in the body. PURPOSE  Increase stool bulk.   Ease and regulate bowel movements.   Lower cholesterol.  INDICATIONS THAT YOU NEED MORE FIBER  Constipation and hemorrhoids.   Uncomplicated diverticulosis (intestine condition) and irritable bowel syndrome.   Weight management.   As a protective measure against hardening of the arteries (atherosclerosis), diabetes, and cancer.   DO NOT USE WITH:  Acute diverticulitis (intestine infection).   Partial small bowel obstructions.   Complicated diverticular disease involving bleeding, rupture (perforation), or abscess (boil, furuncle).   Presence of autonomic neuropathy (nerve damage) or gastroparesis (stomach cannot empty itself).    GUIDELINES FOR INCREASING FIBER IN THE DIET  Start adding fiber to the diet slowly. A gradual increase of about 5 more grams (2 slices of whole-wheat bread, 2 servings of most fruits or vegetables, or 1 bowl of high-fiber cereal) per day is best. Too rapid an increase  in fiber may result in constipation, flatulence, and bloating.   Drink enough water and fluids to keep your urine clear or pale yellow. Water, juice, or caffeine-free drinks are recommended. Not drinking enough fluid may cause constipation.   Eat a variety of high-fiber foods rather than one type of fiber.   Try to increase your intake of fiber through using high-fiber foods rather than fiber pills or supplements that contain small amounts of fiber.   The goal is to change the types of food eaten. Do not supplement your present diet with high-fiber foods, but replace foods in your present diet.    INCLUDE A VARIETY OF FIBER SOURCES  Replace refined and processed grains with whole grains, canned fruits with fresh fruits, and incorporate other fiber sources. White rice, white breads, and most bakery goods contain little or no fiber.   Brown whole-grain rice, buckwheat oats, and many fruits and vegetables are all good sources of fiber. These include: broccoli, Brussels sprouts, cabbage, cauliflower, beets, sweet potatoes, white potatoes (skin on), carrots, tomatoes, eggplant, squash, berries, fresh fruits, and dried fruits.   Cereals appear to be the richest source of fiber. Cereal fiber is found in whole grains and bran. Bran is the fiber-rich outer coat of cereal grain, which is largely removed in refining. In whole-grain cereals, the bran remains. In breakfast cereals, the largest amount of fiber is found in those with "bran" in their names. The fiber content is sometimes indicated on the label.   You may need to include additional fruits and vegetables each day.   In baking, for 1 cup  white flour, you may use the following substitutions:   1 cup whole-wheat flour minus 2 tablespoons.   1/2 cup white flour plus 1/2 cup whole-wheat flour.

## 2013-09-01 NOTE — Progress Notes (Signed)
REVIEWED. SYMPTOMS: RARE RECTAL BLEEDING, FREQUENT RECTAL PRESSURE, ITCHING, BURNING & SOILING. USED TO HAVE CONSTIPATION BUT NOW RESOLVED WITH WATER AND FIBER.  CONSTIPATION: NO DIARRHEA: NO  STRAINS WITH BMs: NO  TIME SPENT ON TOILET: 15 MINS TISSUE POKES OUT OF RECTUM: YES- GOES BACK BY ITSELF & PUSH BACK IN. FIBER SUPPLEMENTS: NO  GLASSES OF WATER/DAY: 6-8; YES   ADDITIONAL QUESTIONS:  LATEX ALLERGY: NO PREGNANT: NO ERECTILE DYSFUNCTION MEDS OR NITRATES: NO ANTICOAGULATION/ANTIPLATELET MEDS: NO DIAGNOSED WITH CROHN'S DISEASE, PROCTITIS, PORTAL HTN, OR ANAL/RECTAL CA: NO TAKING IMMUNOSUPPRESSANTS/XRT: NO  PLAN: 1. CRH BANDING x2   PROCEDURE TECHNIQUE: BENEFITS RISK EXPLAINED TO PT. ANOSCOPY PERFORMED. BULGING INTERNAL HEMORRHOID COLUMN IN THE R POSTERIOR AND ANTERIOR COLUMNS. ONE CRH BAND PLACED IN RIGHT POSTERIOR AND ANTERIOR POSITION. POST-BANDING RECTAL EXAM REVEALED GOOD PLACEMENT. EXAM NON-TENDER.

## 2013-09-01 NOTE — Assessment & Plan Note (Signed)
TCS WITH HEMORRHOID BANDING IN 3 WEEKS PREPOPIK FULL LIQUI DIET IN DAY BEFORE TCS OPV IN 6 WEEKS

## 2013-09-01 NOTE — Progress Notes (Signed)
Primary Care Physician:  Rubbie Battiest, MD Primary Gastroenterologist:  Dr. Oneida Alar   Chief Complaint  Patient presents with  . Hemorrhoids    HPI:   No prior colonoscopy. EGD by Dr. Laural Golden about 3 years ago due to dysphagia, GERD. Wants to lose weight. No dysphagia. 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 States anxiety worsened her symptoms in the past. No dysphagia now. Mild scant hematochezia intermittently. Uses baby wipes. No constipation, diarrhea. Last week or two lower back pain, lower suprapubic pain. Feels not quite right. Drinks lots of water. No urinary symptoms.  Past Medical History  Diagnosis Date  . Insomnia   . Anxiety   . Depression   . Migraine   . Reflux   . Plantar fasciitis     Past Surgical History  Procedure Laterality Date  . None      No current outpatient prescriptions on file.   No current facility-administered medications for this visit.    Allergies as of 09/01/2013  . (No Known Allergies)    Family History  Problem Relation Age of Onset  . Colon cancer Father     diagnosed in late 1s.   . Hypertension Father   . Heart attack Father     History   Social History  . Marital Status: Divorced    Spouse Name: N/A    Number of Children: N/A  . Years of Education: N/A   Occupational History  . [employer]    Social History Main Topics  . Smoking status: Never Smoker   . Smokeless tobacco: Not on file  . Alcohol Use: Yes     Comment: rare  . Drug Use: Not on file  . Sexual Activity: Not on file   Other Topics Concern  . Not on file   Social History Narrative  . No narrative on file    Review of Systems: Gen: Denies any fever, chills, fatigue, weight loss, lack of appetite.  CV: Denies chest pain, heart palpitations, peripheral edema, syncope.  Resp: Denies shortness of breath at rest or with exertion. Denies wheezing or cough.  GI: Denies dysphagia or odynophagia. Denies jaundice,  hematemesis, fecal incontinence. GU : Denies urinary burning, urinary frequency, urinary hesitancy MS: Denies joint pain, muscle weakness, cramps, or limitation of movement.  Derm: Denies rash, itching, dry skin Psych: Denies depression, anxiety, memory loss, and confusion Heme: Denies bruising, bleeding, and enlarged lymph nodes.  Physical Exam: BP 131/84  Pulse 94  Temp(Src) 98.4 F (36.9 C) (Oral)  Wt 262 lb 3.2 oz (118.933 kg)  LMP 06/03/2013 General:   Alert and oriented. Pleasant and cooperative. Well-nourished and well-developed.  Head:  Normocephalic and atraumatic. Eyes:  Without icterus, sclera clear and conjunctiva pink.  Ears:  Normal auditory acuity. Nose:  No deformity, discharge,  or lesions. Mouth:  No deformity or lesions, oral mucosa pink.  Neck:  Supple, without mass or thyromegaly. Lungs:  Clear to auscultation bilaterally. No wheezes, rales, or rhonchi. No distress.  Heart:  S1, S2 present without murmurs appreciated.  Abdomen:  +BS, soft, non-tender and non-distended. No HSM noted. No guarding or rebound. No masses appreciated.  Rectal:  Deferred  Msk:  Symmetrical without gross deformities. Normal posture. Pulses:  Normal pulses noted. Extremities:  Without clubbing or edema. Neurologic:  Alert and  oriented x4;  grossly normal neurologically. Skin:  Intact without significant lesions or rashes. Cervical Nodes:  No significant cervical adenopathy. Psych:  Alert  and cooperative. Normal mood and affect.

## 2013-09-01 NOTE — Telephone Encounter (Signed)
PT called and said she feels like she has to urinate more since she had her hemorrhoid banding today. She wanted to know if this was normal. I told her it is normal to have some different little feelings. She has been drinking a lot of water. She is not having any pinching and no other problems. She will seek medical attention if she starts having any severe pain.   She can be reached on her cell at [phone].

## 2013-09-04 NOTE — Telephone Encounter (Signed)
Called patient TO DISCUSS CONCERNS. LVM-CALL [phone] WITH QUESTIONS OR CONCERNS.

## 2013-10-09 ENCOUNTER — Encounter: Payer: Self-pay | Admitting: Family Medicine

## 2013-10-09 ENCOUNTER — Ambulatory Visit (INDEPENDENT_AMBULATORY_CARE_PROVIDER_SITE_OTHER): Payer: BC Managed Care – PPO | Admitting: Family Medicine

## 2013-10-09 VITALS — BP 132/82 | Ht 67.0 in | Wt 266.0 lb

## 2013-10-09 DIAGNOSIS — R109 Unspecified abdominal pain: Secondary | ICD-10-CM

## 2013-10-09 DIAGNOSIS — R5383 Other fatigue: Secondary | ICD-10-CM

## 2013-10-09 DIAGNOSIS — Z13220 Encounter for screening for lipoid disorders: Secondary | ICD-10-CM

## 2013-10-09 DIAGNOSIS — R5381 Other malaise: Secondary | ICD-10-CM

## 2013-10-09 MED ORDER — NAPROXEN 500 MG PO TABS: 500.0000 mg | ORAL_TABLET | Freq: Two times a day (BID) | ORAL | Status: DC

## 2013-10-09 NOTE — Progress Notes (Signed)
   Subjective:    Patient ID: Adriana [name], female    DOB: Jan 16, 1961, 53 y.o.   MRN: [id number]  HPI  Patient arrives with complaint of pain in both sides of upper back for few days. Patient states that this is the 3rd spell she has had with this issue. Date she's not had anything has triggered the injury or the problems she relates the pain comes and goes she describes as ache in her lower back does not radiate down the legs no abnormal symptoms with it no nausea vomiting diarrhea no fevers dysuria. No hematuria or hematochezia. PMH benign. Patient currently on her menstrual cycle. Review of Systems     Objective:   Physical Exam Her lungs are clear hearts regular flanks nontender to percussion extremities no edema. She is mildly overweight. Abdomen soft no tenderness no masses       Assessment & Plan:  Colon-she was encouraged to get a colonoscopy for screening purposes  Lower back/flank discomfort-she will check a urinalysis once her menses is over. She will also use anti-inflammatory twice a day for the next few weeks. Gentle range of motion exercises/pelvic tilt was shown she will do these then followup in a few weeks if not doing better may need referral to orthopedic specialist or other potential testing possibly physical therapy followup if ongoing troubles  Screening lipid profile recommended.

## 2013-10-09 NOTE — Patient Instructions (Signed)
DASH Diet  The DASH diet stands for "Dietary Approaches to Stop Hypertension." It is a healthy eating plan that has been shown to reduce high blood pressure (hypertension) in as little as 14 days, while also possibly providing other significant health benefits. These other health benefits include reducing the risk of breast cancer after menopause and reducing the risk of type 2 diabetes, heart disease, colon cancer, and stroke. Health benefits also include weight loss and slowing kidney failure in patients with chronic kidney disease.   DIET GUIDELINES  · Limit salt (sodium). Your diet should contain less than 1500 mg of sodium daily.  · Limit refined or processed carbohydrates. Your diet should include mostly whole grains. Desserts and added sugars should be used sparingly.  · Include small amounts of heart-healthy fats. These types of fats include nuts, oils, and tub margarine. Limit saturated and trans fats. These fats have been shown to be harmful in the body.  CHOOSING FOODS   The following food groups are based on a 2000 calorie diet. See your Registered Dietitian for individual calorie needs.  Grains and Grain Products (6 to 8 servings daily)  · Eat More Often: Whole-wheat bread, brown rice, whole-grain or wheat pasta, quinoa, popcorn without added fat or salt (air popped).  · Eat Less Often: White bread, white pasta, white rice, cornbread.  Vegetables (4 to 5 servings daily)  · Eat More Often: Fresh, frozen, and canned vegetables. Vegetables may be raw, steamed, roasted, or grilled with a minimal amount of fat.  · Eat Less Often/Avoid: Creamed or fried vegetables. Vegetables in a cheese sauce.  Fruit (4 to 5 servings daily)  · Eat More Often: All fresh, canned (in natural juice), or frozen fruits. Dried fruits without added sugar. One hundred percent fruit juice (½ cup [237 mL] daily).  · Eat Less Often: Dried fruits with added sugar. Canned fruit in light or heavy syrup.  Lean Meats, Fish, and Poultry (2  servings or less daily. One serving is 3 to 4 oz [85-114 g]).  · Eat More Often: Ninety percent or leaner ground beef, tenderloin, sirloin. Round cuts of beef, chicken breast, turkey breast. All fish. Grill, bake, or broil your meat. Nothing should be fried.  · Eat Less Often/Avoid: Fatty cuts of meat, turkey, or chicken leg, thigh, or wing. Fried cuts of meat or fish.  Dairy (2 to 3 servings)  · Eat More Often: Low-fat or fat-free milk, low-fat plain or light yogurt, reduced-fat or part-skim cheese.  · Eat Less Often/Avoid: Milk (whole, 2%). Whole milk yogurt. Full-fat cheeses.  Nuts, Seeds, and Legumes (4 to 5 servings per week)  · Eat More Often: All without added salt.  · Eat Less Often/Avoid: Salted nuts and seeds, canned beans with added salt.  Fats and Sweets (limited)  · Eat More Often: Vegetable oils, tub margarines without trans fats, sugar-free gelatin. Mayonnaise and salad dressings.  · Eat Less Often/Avoid: Coconut oils, palm oils, butter, stick margarine, cream, half and half, cookies, candy, pie.  FOR MORE INFORMATION  The Dash Diet Eating Plan: www.dashdiet.org  Document Released: 05/24/2011 Document Revised: 08/27/2011 Document Reviewed: 05/24/2011  ExitCare® Patient Information ©2014 [employer].

## 2013-10-16 ENCOUNTER — Ambulatory Visit (HOSPITAL_COMMUNITY)
Admission: RE | Admit: 2013-10-16 | Payer: BC Managed Care – PPO | Source: Ambulatory Visit | Admitting: Gastroenterology

## 2013-10-16 ENCOUNTER — Encounter (HOSPITAL_COMMUNITY): Admission: RE | Payer: Self-pay | Source: Ambulatory Visit

## 2013-10-16 SURGERY — COLONOSCOPY
Anesthesia: Moderate Sedation

## 2013-10-19 ENCOUNTER — Encounter: Payer: Self-pay | Admitting: Family Medicine

## 2013-10-19 ENCOUNTER — Ambulatory Visit (INDEPENDENT_AMBULATORY_CARE_PROVIDER_SITE_OTHER): Payer: BC Managed Care – PPO | Admitting: Family Medicine

## 2013-10-19 VITALS — BP 132/82 | Ht 67.0 in | Wt 258.4 lb

## 2013-10-19 DIAGNOSIS — J209 Acute bronchitis, unspecified: Secondary | ICD-10-CM

## 2013-10-19 DIAGNOSIS — B349 Viral infection, unspecified: Secondary | ICD-10-CM

## 2013-10-19 DIAGNOSIS — B9789 Other viral agents as the cause of diseases classified elsewhere: Secondary | ICD-10-CM

## 2013-10-19 MED ORDER — CLARITHROMYCIN 500 MG PO TABS: 500.0000 mg | ORAL_TABLET | Freq: Two times a day (BID) | ORAL | Status: AC

## 2013-10-19 NOTE — Progress Notes (Signed)
   Subjective:    Patient ID: Adriana [name], female    DOB: [birth year], 53 y.o.   MRN: [id number]  Fever  This is a new problem. The current episode started in the past 7 days. Associated symptoms include congestion, coughing and muscle aches. She has tried fluids for the symptoms.    Bad head ache  Terrible cough   wheeny  Non smoker   headache bad  Some prod phlegm  diminisheed  Energy bad appetitie not good  Felt real bad, kept up on fluids,      Review of Systems  Constitutional: Positive for fever.  HENT: Positive for congestion.   Respiratory: Positive for cough.        Objective:   Physical Exam  Alert moderate malaise. Vitals reviewed. Frontal maxillary congestion tenderness pharynx slight erythema neck supple. Lungs bronchial cough during exam heart regular rate and rhythm.      Assessment & Plan:  Impression viral syndrome a subsequent sinusitis/bronchitis plan antibiotics prescribed. Symptomatic care discussed. Warning signs discussed. WSL

## 2013-10-27 LAB — LIPID PANEL
CHOL/HDL RATIO: 3.5 ratio
CHOLESTEROL: 160 mg/dL (ref 0–200)
HDL: 46 mg/dL (ref >39)
LDL Cholesterol: 81 mg/dL (ref 0–99)
Triglycerides: 165 mg/dL — ABNORMAL HIGH (ref <150)
VLDL: 33 mg/dL (ref 0–40)

## 2013-10-27 LAB — BASIC METABOLIC PANEL
BUN: 11 mg/dL (ref 6–23)
CHLORIDE: 101 meq/L (ref 96–112)
CO2: 29 meq/L (ref 19–32)
Calcium: 9.3 mg/dL (ref 8.4–10.5)
Creat: 0.73 mg/dL (ref 0.50–1.10)
GLUCOSE: 83 mg/dL (ref 70–99)
POTASSIUM: 4.2 meq/L (ref 3.5–5.3)
Sodium: 137 mEq/L (ref 135–145)

## 2013-10-27 LAB — TSH: TSH: 1.562 u[IU]/mL (ref 0.350–4.500)

## 2013-10-28 LAB — URINALYSIS
BILIRUBIN URINE: NEGATIVE
GLUCOSE, UA: NEGATIVE mg/dL
HGB URINE DIPSTICK: NEGATIVE
KETONES UR: NEGATIVE mg/dL
Leukocytes, UA: NEGATIVE
Nitrite: NEGATIVE
PH: 5.5 (ref 5.0–8.0)
Protein, ur: NEGATIVE mg/dL
SPECIFIC GRAVITY, URINE: 1.017 (ref 1.005–1.030)
Urobilinogen, UA: 0.2 mg/dL (ref 0.0–1.0)

## 2013-11-03 ENCOUNTER — Ambulatory Visit: Payer: BC Managed Care – PPO | Admitting: Family Medicine

## 2013-11-12 ENCOUNTER — Ambulatory Visit (INDEPENDENT_AMBULATORY_CARE_PROVIDER_SITE_OTHER): Payer: BC Managed Care – PPO | Admitting: Family Medicine

## 2013-11-12 ENCOUNTER — Encounter: Payer: Self-pay | Admitting: Family Medicine

## 2013-11-12 VITALS — BP 104/70 | Ht 67.0 in | Wt 261.0 lb

## 2013-11-12 DIAGNOSIS — M549 Dorsalgia, unspecified: Secondary | ICD-10-CM

## 2013-11-12 DIAGNOSIS — G8929 Other chronic pain: Secondary | ICD-10-CM

## 2013-11-12 DIAGNOSIS — F329 Major depressive disorder, single episode, unspecified: Secondary | ICD-10-CM

## 2013-11-12 DIAGNOSIS — F32A Depression, unspecified: Secondary | ICD-10-CM

## 2013-11-12 DIAGNOSIS — F3289 Other specified depressive episodes: Secondary | ICD-10-CM

## 2013-11-12 LAB — POCT URINALYSIS DIPSTICK
SPEC GRAV UA: 1.01
pH, UA: 7

## 2013-11-12 MED ORDER — CHLORZOXAZONE 500 MG PO TABS: 500.0000 mg | ORAL_TABLET | Freq: Three times a day (TID) | ORAL | Status: DC | PRN

## 2013-11-12 MED ORDER — NABUMETONE 750 MG PO TABS: 750.0000 mg | ORAL_TABLET | Freq: Two times a day (BID) | ORAL | Status: DC | PRN

## 2013-11-12 NOTE — Progress Notes (Signed)
   Subjective:    Patient ID: Adriana [name], female    DOB: May 22, 1961, 53 y.o.   MRN: [id number]  HPI Patient is here today for a follow up visit on flank/low back pain. This issue has not improved.generslly back has been stable thru the yrs  Left greater than right  Comes and goes  Pain for months off and on  Bothers more so any time, off and on,  No sig change with motions  Night time numbness in hands at night  Some neck discomfort off and on,   Patient had labwork done on 10/27/13 and would like to discuss these results. Patient states that her depression is worsening also. No other concerns at this time.   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 H&T normal. Lungs clear. Heart rare rhythm. Low back tender to deep palpation. Left greater than right. Negative straight leg raise. Negative true spinal tenderness.  Urinalysis unremarkable.       Assessment & Plan:  Impression chronic low back pain discussed. Likely secondary to inactivity obesity and age all discussed. #2 depression somewhat worse patient definitely does not want any medication. Plan 25 minutes spent most in discussion. Exercise discussed in encourage local measures discussed. Low back exercise discussed. Low back x-ray per their  recommendations based results. WSL

## 2013-11-15 DIAGNOSIS — M549 Dorsalgia, unspecified: Secondary | ICD-10-CM

## 2013-11-15 DIAGNOSIS — G8929 Other chronic pain: Secondary | ICD-10-CM | POA: Insufficient documentation

## 2013-11-15 DIAGNOSIS — F329 Major depressive disorder, single episode, unspecified: Secondary | ICD-10-CM | POA: Insufficient documentation

## 2013-11-15 DIAGNOSIS — F32A Depression, unspecified: Secondary | ICD-10-CM | POA: Insufficient documentation

## 2013-12-19 DIAGNOSIS — H8110 Benign paroxysmal vertigo, unspecified ear: Secondary | ICD-10-CM

## 2013-12-19 HISTORY — DX: Benign paroxysmal vertigo, unspecified ear: H81.10

## 2014-08-16 ENCOUNTER — Ambulatory Visit: Payer: BLUE CROSS/BLUE SHIELD | Admitting: Neurology

## 2014-08-24 ENCOUNTER — Encounter: Payer: Self-pay | Admitting: Family Medicine

## 2014-08-24 ENCOUNTER — Ambulatory Visit (INDEPENDENT_AMBULATORY_CARE_PROVIDER_SITE_OTHER): Payer: BLUE CROSS/BLUE SHIELD | Admitting: Family Medicine

## 2014-08-24 VITALS — BP 112/80 | Temp 98.4°F | Ht 67.0 in | Wt 262.0 lb

## 2014-08-24 DIAGNOSIS — J111 Influenza due to unidentified influenza virus with other respiratory manifestations: Secondary | ICD-10-CM | POA: Diagnosis not present

## 2014-08-24 DIAGNOSIS — J209 Acute bronchitis, unspecified: Secondary | ICD-10-CM

## 2014-08-24 MED ORDER — AZITHROMYCIN 250 MG PO TABS: ORAL_TABLET | ORAL | Status: DC

## 2014-08-24 MED ORDER — ALBUTEROL SULFATE HFA 108 (90 BASE) MCG/ACT IN AERS: 2.0000 | INHALATION_SPRAY | Freq: Four times a day (QID) | RESPIRATORY_TRACT | Status: DC | PRN

## 2014-08-24 NOTE — Progress Notes (Signed)
   Subjective:    Patient ID: Adriana [name], female    DOB: [birth year], 54 y.o.   MRN: [id number]  URI  This is a new problem. The current episode started in the past 7 days. The problem has been unchanged. Associated symptoms include congestion, coughing and wheezing. Associated symptoms comments: Body aches, fatigue, sob. She has tried increased fluids for the symptoms. The treatment provided mild relief.   Patient states that she believes she has an infection in her left eye. This has been present for about 2 months now.    Review of Systems  HENT: Positive for congestion.   Respiratory: Positive for cough and wheezing.    Relates fever sweats chills body aches headaches did not feel good but worse over the past weekend a little bit better today    Objective:   Physical Exam  Lungs clear heart regular sinus nontender throat normal neck supple lungs clear cough noted course  Left eye slight irritation but no sign of any obvious infection    Assessment & Plan:  Influenza Influenza-the patient was diagnosed with influenza. Patient/family educated about the flu and warning signs to watch for. If difficulty breathing, severe neck pain and stiffness, cyanosis, disorientation, or progressive worsening then immediately get rechecked at that ER. If progressive symptoms be certain to be rechecked. Supportive measures such as Tylenol/ibuprofen was discussed. No aspirin use in children. And influenza home care instruction sheet was given. Secondary bronchitis Zithromax as directed warning signs discuss  May have a mild form of eczema or other chronic condition causing the left eye to do a little bit of crusting I don't recommend drops but I do recommend using warm soapy water twice daily to help this out. Follow-up if ongoing troubles.

## 2014-12-23 ENCOUNTER — Ambulatory Visit (INDEPENDENT_AMBULATORY_CARE_PROVIDER_SITE_OTHER): Payer: BLUE CROSS/BLUE SHIELD | Admitting: Family Medicine

## 2014-12-23 ENCOUNTER — Encounter: Payer: Self-pay | Admitting: Family Medicine

## 2014-12-23 VITALS — BP 118/86 | Temp 98.4°F | Ht 66.5 in | Wt 270.0 lb

## 2014-12-23 DIAGNOSIS — R21 Rash and other nonspecific skin eruption: Secondary | ICD-10-CM | POA: Diagnosis not present

## 2014-12-23 NOTE — Progress Notes (Signed)
   Subjective:    Patient ID: Adriana [name], female    DOB: 04-Oct-1960, 54 y.o.   MRN: [id number]  Rash This is a new problem. Episode onset: 1 week ago. Location: right arm and abdomen. The rash is characterized by itchiness and redness. Associated with: mango, 5HTP ( supplement for relaxation) Treatments tried: tree tea oil, vinegar, otc ointment  The treatment provided mild relief.   Wheezing since having the flu a few months ago. Shortness of cough and cough.   Patchy rash. Pruritic in nature. One week's duration. 8 some mango and wonders if it is related. Over-the-counter medications not helping.   Review of Systems  Skin: Positive for rash.   no headache no chest pain     Objective:   Physical Exam  Alert vitals stable no acute distress lungs clear. Heart regular in rhythm. Couple patches of discrete erythema hypertrophy a linear component on abdomen discussed      Assessment & Plan:  Impression 1 contact dermatitis #2 post viral symptoms plan symptom care discussed. Triamcinolone cream twice a day to affected area. WSL

## 2015-01-26 ENCOUNTER — Encounter: Payer: Self-pay | Admitting: Nurse Practitioner

## 2015-01-26 ENCOUNTER — Ambulatory Visit (INDEPENDENT_AMBULATORY_CARE_PROVIDER_SITE_OTHER): Payer: BLUE CROSS/BLUE SHIELD | Admitting: Nurse Practitioner

## 2015-01-26 VITALS — BP 100/78 | HR 90 | Ht 66.25 in | Wt 266.8 lb

## 2015-01-26 DIAGNOSIS — Z780 Asymptomatic menopausal state: Secondary | ICD-10-CM | POA: Diagnosis not present

## 2015-01-26 DIAGNOSIS — Z0000 Encounter for general adult medical examination without abnormal findings: Secondary | ICD-10-CM | POA: Diagnosis not present

## 2015-01-26 DIAGNOSIS — Z01419 Encounter for gynecological examination (general) (routine) without abnormal findings: Secondary | ICD-10-CM | POA: Diagnosis not present

## 2015-01-26 DIAGNOSIS — Z113 Encounter for screening for infections with a predominantly sexual mode of transmission: Secondary | ICD-10-CM

## 2015-01-26 LAB — POCT URINALYSIS DIPSTICK
Leukocytes, UA: NEGATIVE
Urobilinogen, UA: NEGATIVE
pH, UA: 5

## 2015-01-26 MED ORDER — PROGESTERONE MICRONIZED 200 MG PO CAPS: 200.0000 mg | ORAL_CAPSULE | Freq: Every day | ORAL | Status: DC

## 2015-01-26 MED ORDER — PROGESTERONE MICRONIZED 200 MG PO CAPS: ORAL_CAPSULE | ORAL | Status: DC

## 2015-01-26 NOTE — Patient Instructions (Signed)

## 2015-01-26 NOTE — Progress Notes (Signed)
54 y.o. G32P1011 Divorced  Caucasian Fe here for annual exam.  No menses for about a year.  No major vaso symptoms but also not feeling that menses is going to start. She just feels more emotional and no motivation.  Almost PMS a lot, nervous and anxious.  Thought she may be having thyroid problems but test was normal.  Then thought heart issue and echo was normal.  Dating  same partner for 6 years. Unsure if he is monogamous.   She is requesting STD's.  Her daughter has now moved away.  She feels stressed and has concerns about gaining weight.  She saw her PCP through work @ [place] who did some labs including hormone levels and has started her on HRT.  She has not yet started so comes in today to establish care, update her pap and to consider if HRT if right for her.    Patient's last menstrual period was 01/16/2014.          Sexually active: Yes.    The current method of family planning is none.    Exercising: No.  The patient does not participate in regular exercise at present. Smoker:  no  Health Maintenance: Pap:  08/24/2009 wnl MMG:  25 years ago- normal Colonoscopy:  Never had one  BMD:   Never had one TDaP:  Is due  Labs: PCP ; Urine: wbc's + - no symptoms - related to vaginal   reports that she has never smoked. She does not have any smokeless tobacco history on file. She reports that she drinks alcohol.  Past Medical History  Diagnosis Date  . Insomnia   . Anxiety   . Depression   . Migraine   . Reflux   . Plantar fasciitis   . Benign positional vertigo 12/19/13    Past Surgical History  Procedure Laterality Date  . None      Current Outpatient Prescriptions  Medication Sig Dispense Refill  . progesterone (PROMETRIUM) 100 MG capsule   2  . progesterone (PROMETRIUM) 200 MG capsule Take 1 capsule (200 mg total) by mouth daily. Take daily for 14 days each month.  Start on the first day of the month and take until the 14th day. 14 capsule 0   No current  facility-administered medications for this visit.    Family History  Problem Relation Age of Onset  . Colon cancer Father     diagnosed in late 29s.   . Hypertension Father   . Heart attack Father     ROS:  Pertinent items are noted in HPI.  Otherwise, a comprehensive ROS was negative.  Exam:   BP 100/78 mmHg  Pulse 90  Ht 5' 6.25" (1.683 m)  Wt 266 lb 12.8 oz (121.02 kg)  BMI 42.73 kg/m2  LMP 01/16/2014 Height: 5' 6.25" (168.3 cm) Ht Readings from Last 3 Encounters:  01/26/15 5' 6.25" (1.683 m)  12/23/14 5' 6.5" (1.689 m)  08/24/14 5\' 7"  (1.702 m)    General appearance: alert, cooperative and appears stated age Head: Normocephalic, without obvious abnormality, atraumatic Neck: no adenopathy, supple, symmetrical, trachea midline and thyroid normal to inspection and palpation Lungs: clear to auscultation bilaterally Breasts: normal appearance, no masses or tenderness Heart: regular rate and rhythm Abdomen: soft, non-tender; no masses,  no organomegaly Extremities: extremities normal, atraumatic, no cyanosis or edema Skin: Skin color, texture, turgor normal. No rashes or lesions Lymph nodes: Cervical, supraclavicular, and axillary nodes normal. No abnormal inguinal nodes palpated Neurologic: Grossly  normal   Pelvic: External genitalia:  no lesions              Urethra:  normal appearing urethra with no masses, tenderness or lesions              Bartholin's and Skene's: normal                 Vagina: normal appearing vagina with normal color and clear to white discharge, no lesions              Cervix: anteverted              Pap taken: Yes.   Bimanual Exam:  Uterus:  normal size, contour, position, consistency, mobility, non-tender              Adnexa: no mass, fullness, tenderness               Rectovaginal: Confirms               Anus:  normal sphincter tone, no lesions  Chaperone present: Yes  A:  Well Woman with normal exam  Lapse of GYN health care  R/O  STD's  Peri - postmenopausal with FSH at only 48 on 12/21/14  Need for Mammogram    BMI - 42.74  P:   Reviewed health and wellness pertinent to exam  Pap smear as above  Mammogram is past due - she is cautioned about starting HRT before a Mammogram is done - she normally when she gets one will do a Thermogram - we advised Mammo.  She is given Prometrium 200 mg X 14 days - then expect a withdrawal bleed within 2 weeks.  To call back with response.  If bleeding then needs endo biopsy.discussed the challenge and rationale.  Reviewed WHI study with +/- aspects of HRT along with risk factors and benefits including CVA, DVT, cancer.  Follow with labs including Affirm  Advised to hold Prometrium 100 mg daily and Estradiol patches 0/025 mg weekly given by Dr. Anastasio Champion.  Rx is at pharmacy but has not yet started.  Counseled on breast self exam, mammography screening, STD prevention, use and side effects of HRT, adequate intake of calcium and vitamin D, diet and exercise, Kegel's exercises return annually or prn  An After Visit Summary was printed and given to the patient.  Review of medical records bought in by pt.: to be scanned Echo: 12/30/14- left ventricle normal, mild septal hypertrophy, no valvular regurg. Thyroid  US: 12/30/14 normal Spirometry: 12/30/14 - normal Labs from 12/21/14: FSH:  48 LH: 27.1 Progesterone 0.4 Free testosterone: 2.3 CBC: normal Vit D 21 TSH: 1.758 Estradiol level: 32.8 CMP: normal Lipid panel: normal

## 2015-01-27 ENCOUNTER — Other Ambulatory Visit: Payer: Self-pay | Admitting: Certified Nurse Midwife

## 2015-01-27 DIAGNOSIS — B9689 Other specified bacterial agents as the cause of diseases classified elsewhere: Secondary | ICD-10-CM

## 2015-01-27 DIAGNOSIS — N760 Acute vaginitis: Principal | ICD-10-CM

## 2015-01-27 LAB — STD PANEL
HIV 1&2 Ab, 4th Generation: NONREACTIVE
Hepatitis B Surface Ag: NEGATIVE

## 2015-01-27 LAB — WET PREP BY MOLECULAR PROBE
CANDIDA SPECIES: NEGATIVE
GARDNERELLA VAGINALIS: POSITIVE — AB
TRICHOMONAS VAG: NEGATIVE

## 2015-01-27 MED ORDER — HYLAFEM VA SUPP: 1.0000 | Freq: Every day | VAGINAL | Status: DC

## 2015-01-28 LAB — IPS PAP TEST WITH HPV

## 2015-01-28 LAB — IPS N GONORRHOEA AND CHLAMYDIA BY PCR

## 2015-01-31 NOTE — Progress Notes (Signed)
Encounter reviewed by Dr. Aundria Rud. I recommend doing a mammogram prior to starting any HRT.  Her labs look like she is perimenopausal.  Agree with the progesterone challenge.

## 2015-02-11 ENCOUNTER — Telehealth: Payer: Self-pay

## 2015-02-11 NOTE — Telephone Encounter (Signed)
Spoke with patient to check on status of mammogram. Patient states she has not had a recent mammogram. Advised patient of important of mammogram and encouraged to schedule. Advised will need to have mammogram performed before starting on HRT. Patient was prescribed Estradiol 0/025 mg weekly patches by Dr.Gosrani. Patient is agreeable and will call to schedule. Patient will monitor for any bleeding for two weeks after completing Prometrium 200 mg x 14 days. Will call with update on positive or negative bleed.  Routing to provider for final review. Patient agreeable to disposition. Will close encounter.   Patient aware provider will review message and nurse will return call if any additional advice or change of disposition.

## 2015-03-09 ENCOUNTER — Ambulatory Visit (INDEPENDENT_AMBULATORY_CARE_PROVIDER_SITE_OTHER): Payer: BLUE CROSS/BLUE SHIELD | Admitting: Family Medicine

## 2015-03-09 ENCOUNTER — Encounter: Payer: Self-pay | Admitting: Family Medicine

## 2015-03-09 VITALS — BP 122/86 | Ht 66.5 in | Wt 269.0 lb

## 2015-03-09 DIAGNOSIS — R931 Abnormal findings on diagnostic imaging of heart and coronary circulation: Secondary | ICD-10-CM

## 2015-03-09 DIAGNOSIS — R0600 Dyspnea, unspecified: Secondary | ICD-10-CM

## 2015-03-09 DIAGNOSIS — R0609 Other forms of dyspnea: Secondary | ICD-10-CM

## 2015-03-09 NOTE — Progress Notes (Signed)
   Subjective:    Patient ID: Adriana [name], female    DOB: 26-Sep-1960, 54 y.o.   MRN: [id number]  HPI  Patient come in to discuss testing she had thru [facility] -actually went to see a new doctor, Dr. Delfin Edis Anastasio Champion. He did quite a bit of testing on her.  He did a tremendous amount of blood work including multiple endocrine tests.   In addition he did an echocardiogram which has basically alarmed the patient. It showed potential for decreased ejection fraction. The patient has taken many of her chronic symptoms such as chronic shortness of breath, tendency to swell and ankles, exertional dyspnea, and has pretty much convinced herself that she has congestive heart failure  Pt notes cough for a few weeks, little tickle coughj off and on    Pt notes swollen ankles, balance off a times    Patient had a major blood work panel drawn and echo an thyroid u/s and would like to discuss referral to cardiology for abnormality on e echocardiogram  Vit D supplement patient takes regularly  Ongoing fatigue.  Ongoing sense of tightness in the throat at times. We have sent her to an ENT in the past for full and subsequently negative workup  Review of Systems No headache no chest pain no back pain no abdominal pain no change in bowel habits    Objective:   Physical Exam Alert vital stable HEENT normal neck supple. Lungs clear. Heart regular in rhythm. Ankles trace edema at most most swelling and ankles appear to be adipose tissue.       Assessment & Plan:  Impression question echocardiogram changes. I've advised the patient that we will need a good local cardiologist to consult on her case as she is requesting and let us know whether she really has substantial heart disease. I think this is likely not the case. Discussed at great length. Plan 25 minutes spent most in discussion. Exercise encourage. Of note is on progesterone supplement via her GYN. A I've advised her anytime we can  help her many issues will be happy to in the future. WSL

## 2015-03-09 NOTE — Patient Instructions (Signed)
We will let you know on the cardiology referral

## 2015-03-23 ENCOUNTER — Telehealth: Payer: Self-pay | Admitting: Nurse Practitioner

## 2015-03-23 DIAGNOSIS — Z0189 Encounter for other specified special examinations: Secondary | ICD-10-CM

## 2015-03-23 NOTE — Telephone Encounter (Signed)
Patient says after taking the hormone medication for 2 weeks, she started spotting lightly.

## 2015-03-23 NOTE — Telephone Encounter (Signed)
Spoke with patient. Patient states that she has taken the Prometrium 200 mg. Took her last pill last night. Began having spotting two days ago with mild cramping. States she has another rx from her PCP for Prometrium 100 mg that she was advised to take before she saw Kem Boroughs, FNP. "Chong Sicilian mentioned me going ahead and taking that prescription as well after I finished the one she gave me. I am not sure if I still need to do that or not." Unsure of the quantity of the medicaiton she was given by her PCP. Advised I will speak with covering provider and return call with further recommendations.

## 2015-03-24 NOTE — Telephone Encounter (Signed)
Spoke with patient. Advised of message as seen below from [place]. Patient is agreeable. Does have an estradiol patch on. Advised will need to take this off and stop Prometrium. Patient is agreeable. 2 week lab recheck scheduled for 10/21 at 4 pm. Agreeable to date and time. Orders for Estradiol and FSH placed.  Routing to provider for final review. Patient agreeable to disposition. Will close encounter.

## 2015-03-24 NOTE — Telephone Encounter (Signed)
Reviewed chart and noted she had not had menses for one year and also noted from medication list she was give Estrogen and Prometrium with PCP. Will consult to see if she needs endo biopsy. Will advise

## 2015-03-24 NOTE — Telephone Encounter (Signed)
Spoke with Dr. Quincy Simmonds she recommends having patient stop Prometrium and estrogen is she is still using. Wait 2 weeks and come in for [facility] and estradiol level to determine continued management of ? Menopause. Let me know is she is agreeable so I can place orders.  Note to Dr. Quincy Simmonds also patient weight 269

## 2015-03-30 ENCOUNTER — Ambulatory Visit (INDEPENDENT_AMBULATORY_CARE_PROVIDER_SITE_OTHER): Payer: BLUE CROSS/BLUE SHIELD | Admitting: Cardiology

## 2015-03-30 ENCOUNTER — Encounter: Payer: Self-pay | Admitting: Cardiology

## 2015-03-30 VITALS — BP 122/80 | HR 100 | Ht 67.0 in | Wt 267.2 lb

## 2015-03-30 DIAGNOSIS — R0609 Other forms of dyspnea: Secondary | ICD-10-CM

## 2015-03-30 DIAGNOSIS — R0602 Shortness of breath: Secondary | ICD-10-CM | POA: Diagnosis not present

## 2015-03-30 DIAGNOSIS — R0600 Dyspnea, unspecified: Secondary | ICD-10-CM

## 2015-03-30 DIAGNOSIS — Z136 Encounter for screening for cardiovascular disorders: Secondary | ICD-10-CM | POA: Diagnosis not present

## 2015-03-30 NOTE — Progress Notes (Signed)
Patient ID: Adriana [name], female   DOB: [birth year], 54 y.o.   MRN: [id number]     Clinical Summary Adriana [name] is a 54 y.o.female seen today as a new patient for the following medical problems.  1. DOE - SOB for approximately 7 years - can occur at rest or with activity. DOE walking room to room at home. DOE walking up 1 flight of stairs - denies any chest pain. Can have short just occasional fluttering in chest. Notes some LE edema at times - no smoking history.   echo 12/2014 LVEF 62%, grade I diasotlic dysfunction. No significant valve pathology. - labs 12/2014 TSH 1.75, K 3.6, Cr 0.68, LDL 107    Past Medical History  Diagnosis Date  . Insomnia   . Anxiety   . Depression   . Migraine   . Reflux   . Plantar fasciitis   . Benign positional vertigo 12/19/13     Allergies  Allergen Reactions  . Hydrocodone Nausea And Vomiting     Current Outpatient Prescriptions  Medication Sig Dispense Refill  . Homeopathic Products (HYLAFEM) SUPP Place 1 suppository vaginally at bedtime. 3 suppository 1  . PRESCRIPTION MEDICATION Estrogen patch    . progesterone (PROMETRIUM) 200 MG capsule 1 tablet by mouth daily for 14 days 14 capsule 0   No current facility-administered medications for this visit.     Past Surgical History  Procedure Laterality Date  . None       Allergies  Allergen Reactions  . Hydrocodone Nausea And Vomiting      Family History  Problem Relation Age of Onset  . Colon cancer Father     diagnosed in late 32s.   . Hypertension Father   . Heart attack Father      Social History Ms. Arline reports that she has never smoked. She does not have any smokeless tobacco history on file. Ms. Diefendorf reports that she drinks alcohol.   Review of Systems CONSTITUTIONAL: No weight loss, fever, chills, weakness or fatigue.  HEENT: Eyes: No visual loss, blurred vision, double vision or yellow sclerae.No hearing loss, sneezing, congestion, runny nose  or sore throat.  SKIN: No rash or itching.  CARDIOVASCULAR: per HPI RESPIRATORY: per HPI.  GASTROINTESTINAL: No anorexia, nausea, vomiting or diarrhea. No abdominal pain or blood.  GENITOURINARY: No burning on urination, no polyuria NEUROLOGICAL: No headache, dizziness, syncope, paralysis, ataxia, numbness or tingling in the extremities. No change in bowel or bladder control.  MUSCULOSKELETAL: No muscle, back pain, joint pain or stiffness.  LYMPHATICS: No enlarged nodes. No history of splenectomy.  PSYCHIATRIC: No history of depression or anxiety.  ENDOCRINOLOGIC: No reports of sweating, cold or heat intolerance. No polyuria or polydipsia.  Marland Kitchen   Physical Examination Filed Vitals:   03/30/15 1029  BP: 122/80  Pulse: 100   Filed Vitals:   03/30/15 1029  Height: 5\' 7"  (1.702 m)  Weight: 267 lb 3.2 oz (121.201 kg)    Gen: resting comfortably, no acute distress HEENT: no scleral icterus, pupils equal round and reactive, no palptable cervical adenopathy,  CV Resp: Clear to auscultation bilaterally GI: abdomen is soft, non-tender, non-distended, normal bowel sounds, no hepatosplenomegaly MSK: extremities are warm, no edema.  Skin: warm, no rash Neuro:  no focal deficits Psych: appropriate affect   Diagnostic Studies     Assessment and Plan  1. DOE - unclear etiology. Echo was normal, PFTs have been normal - will get GXT with pulse ox to better objectively assess her  exercise capacity and symptoms, as well as evaluate for ischemia.      F/u pending stress results   Arnoldo Lenis, M.D

## 2015-03-30 NOTE — Patient Instructions (Signed)
Your physician recommends that you continue on your current medications as directed. Please refer to the Current Medication list given to you today.  Your physician has requested that you have an exercise tolerance test. For further information please visit [email or web address]. Please also follow instruction sheet, as given.  Follow up to be determined once stress test is reviewed by Dr. Harl Bowie.

## 2015-04-08 ENCOUNTER — Other Ambulatory Visit: Payer: BLUE CROSS/BLUE SHIELD

## 2015-04-08 ENCOUNTER — Inpatient Hospital Stay (HOSPITAL_COMMUNITY): Admission: RE | Admit: 2015-04-08 | Payer: BLUE CROSS/BLUE SHIELD | Source: Ambulatory Visit

## 2015-04-08 DIAGNOSIS — Z0000 Encounter for general adult medical examination without abnormal findings: Secondary | ICD-10-CM

## 2015-04-08 DIAGNOSIS — Z0189 Encounter for other specified special examinations: Secondary | ICD-10-CM

## 2015-04-09 LAB — ESTRADIOL, FREE

## 2015-04-12 LAB — ESTRADIOL: Estradiol: 21.4 pg/mL

## 2015-04-12 LAB — FOLLICLE STIMULATING HORMONE: FSH: 43.8 m[IU]/mL

## 2015-04-14 ENCOUNTER — Encounter (HOSPITAL_COMMUNITY): Payer: BLUE CROSS/BLUE SHIELD

## 2015-04-18 ENCOUNTER — Telehealth: Payer: Self-pay | Admitting: Obstetrics and Gynecology

## 2015-04-18 ENCOUNTER — Telehealth: Payer: Self-pay

## 2015-04-18 DIAGNOSIS — N939 Abnormal uterine and vaginal bleeding, unspecified: Secondary | ICD-10-CM

## 2015-04-18 NOTE — Telephone Encounter (Signed)
Spoke with patient. Reviewed benefit for procedure. Patient understood and agreeable. Verified arrival date/time for procedure. Patient agreeable. Ok to close. °

## 2015-04-18 NOTE — Telephone Encounter (Signed)
Orders placed.  Cc: Theresia Lo for precert  Routing to provider for final review. Patient agreeable to disposition. Will close encounter.

## 2015-04-18 NOTE — Telephone Encounter (Signed)
Diagnosis will be abnormal uterine bleeding.

## 2015-04-18 NOTE — Telephone Encounter (Signed)
Spoke with patient. Advised of message as seen below. Patient is agreeable and verbalizes understanding. Appointment for PUS and possible EMB scheduled for 04/28/2015 at 11:30 am with 12 pm consult with Dr.Silva. Patient is agreeable to date and time. Patient verbalized understanding of the [facility] appointment cancellation policy. Advised will need to cancel or reschedule within 72 business hours of appointment (3 business days) or will have $100.00 late cancellation fee placed to account. Advised to take 800 mg of Ibuprofen/Motrin one hour prior to the appointment in case a biopsy is needed. Patient is agreeable.   Orders pending. Dr.Silva, please advise diagnosis associated with PUS and EMB.

## 2015-04-18 NOTE — Telephone Encounter (Signed)
Adriana [name], Christopher, RN           Please call pt and have her to come in for both PUS and possible endo biopsy and consult all at one time that would be great. If she declines then schedule a consult visit with Dr. Quincy Simmonds.  She may need more evaluation than originally thought since no bleeding for a year prior to the Provera challenge.

## 2015-04-20 ENCOUNTER — Ambulatory Visit (HOSPITAL_COMMUNITY)
Admission: RE | Admit: 2015-04-20 | Discharge: 2015-04-20 | Disposition: A | Discharge status: Home / Self Care | Payer: BLUE CROSS/BLUE SHIELD | Source: Ambulatory Visit | Attending: Cardiology | Admitting: Cardiology

## 2015-04-20 ENCOUNTER — Telehealth: Payer: Self-pay | Admitting: Emergency Medicine

## 2015-04-20 DIAGNOSIS — R0602 Shortness of breath: Secondary | ICD-10-CM | POA: Diagnosis present

## 2015-04-20 LAB — EXERCISE TOLERANCE TEST
CHL CUP MPHR: 168 {beats}/min
CHL CUP RESTING HR STRESS: 83 {beats}/min
CHL RATE OF PERCEIVED EXERTION: 17
CSEPED: 6 min
CSEPEDS: 0 s
Estimated workload: 7 METS
Peak BP: 96 mmHg
Peak HR: 162 {beats}/min
Percent HR: 96 %

## 2015-04-20 NOTE — Telephone Encounter (Signed)
-----   Message from Kem Boroughs, [place] sent at 04/15/2015  9:02 AM EDT ----- Please inform patient of perimenopausal status and recommendations.  But she must get Mammo first.  ----- Message -----    From: Nunzio Cobbs, MD    Sent: 04/15/2015   8:12 AM      To: Kem Boroughs, FNP  Hi Patty,   On reviewing her hormonal labs, [place] from now and previously, I really think that this patient is perimenopausal and had an appropriate withdrawal bleed from the progesterone.   She does need to have her mammogram done to be on any hormonal therapy.   If she does the mammogram, she could do LoLoestrin for 6 months or until AEX, if she does not have any contraindications to combined oral contraception.   Thanks,   [place]    ----- Message -----    From: Kem Boroughs, FNP    Sent: 04/12/2015   4:48 PM      To: Nunzio Cobbs, MD  This is the pt that was placed on 2 weeks of Prometrium with an [facility] of [facility].  She had a withdrawal bleed.  She was given HRT from PCP and then wanted our input - she still has not had a mammogram.  Now FSH is 43.8 and estradiol level is 21.4  Can we discuss her on Wednesday?  PG  ----- Message -----    From: Regina Eck, CNM    Sent: 04/12/2015   7:34 AM      To: Kem Boroughs, FNP  Please review, this your patient with question concerning PMB. I did not advise.

## 2015-04-20 NOTE — Telephone Encounter (Signed)
Dr. Quincy Simmonds,  Patient is scheduled for Pelvic ultrasound and Endometrial biopsy on 04/28/15 with you, okay to cancel those procedures for now?

## 2015-04-20 NOTE — Telephone Encounter (Signed)
Please keep appointment for pelvic ultrasound and endometrial biopsy.  She had been amenorrheic for one year prior to her bleeding.  We have all decided she needs to return for this visit with me to review her care and proceed with the evaluation.   Thank you.

## 2015-04-21 NOTE — Telephone Encounter (Signed)
Patient notified of messages from Kem Boroughs, [place] and Dr. Quincy Simmonds.  Patient verbalized understanding she must get mammogram before starting on any oral contraception.  Patient has procedure appointment scheduled for 04/28/15 with Dr. Quincy Simmonds and will keep those appointments.  Routing to provider for final review. Patient agreeable to disposition. Will close encounter.

## 2015-04-27 ENCOUNTER — Encounter: Payer: Self-pay | Admitting: Family Medicine

## 2015-04-27 ENCOUNTER — Ambulatory Visit (INDEPENDENT_AMBULATORY_CARE_PROVIDER_SITE_OTHER): Payer: BLUE CROSS/BLUE SHIELD | Admitting: Family Medicine

## 2015-04-27 VITALS — BP 122/78 | Temp 98.7°F | Ht 66.5 in | Wt 267.0 lb

## 2015-04-27 DIAGNOSIS — J019 Acute sinusitis, unspecified: Secondary | ICD-10-CM | POA: Diagnosis not present

## 2015-04-27 DIAGNOSIS — K219 Gastro-esophageal reflux disease without esophagitis: Secondary | ICD-10-CM | POA: Diagnosis not present

## 2015-04-27 MED ORDER — RANITIDINE HCL 300 MG PO TABS: 300.0000 mg | ORAL_TABLET | Freq: Every day | ORAL | Status: DC

## 2015-04-27 MED ORDER — AMOXICILLIN 500 MG PO TABS: 500.0000 mg | ORAL_TABLET | Freq: Three times a day (TID) | ORAL | Status: DC

## 2015-04-27 NOTE — Patient Instructions (Signed)
Dear Patient,  It has been recommended to you that you have a colonoscopy. It is your responsibility to carry through with this recommendation.   Did you realize that colon cancer is the second leading cancer killer in the [place]. One in every 20 adults will get colon cancer. If all adults would go through the recommended screening for colon cancer (getting a colonoscopy), then there would be a 60% reduction in the number of people dying from colon cancer.  Colon cancer just doesn't come out of the blue. It starts off as a small polyp which over time grows into a cancer. A colonoscopy can prevent cancer and in many cases detected when it is at a very treatable phase. Small colon cancers can have cure rates of 95%. Advanced colon cancer, which often occurs in people who do not do their screenings, have cure rates less than 20%. The risk of colon cancer advances with age. Most adults should have regular colonoscopies every 10 years starting at age 50. This recommendation can vary depending on a person's medical history.  Health-care laws now allow for you to call the gastroenterologist office directly in order to set yourself up for this very important tests. Today we have recommended to you that you do this test. This test may save your life. Failure to do this test puts you at risk for premature death from colon cancer. Do the right thing and schedule this test now.  Here as a list of specialists we recommend in the surrounding area. When you call their office let them know that you are a patient of our practice in your interested in doing a screening colonoscopy. They should assist you without problems. You will need the following information when you called them: 1-name of which Dr. you see, 2-your insurance information, 3-a list of medications that you currently take, 4-any allergies you have to medications.  [place] gastroenterologist Dr. Mike Rourk, Dr Sandi Fields   [place]  gastroenterologist   [phone]  Dr.Najeeb Rehman [facility]   [phone]  [place] [facility] (Dr. Perry, N, Stark, Brodie, Gesner, Jacobs and Pyrtle) [phone]  [facility] (Dr. Buscemi, Edwards, Hayes, Maygod,Outlaw,Schooler) [phone]  Each group of specialists has assured us that when you called them they will help you get your colonoscopy set up. Should you have problems or if the GI practice insist a referral be done please let us know. Be sure to call soon. Sincerely, Carolyn Hoskins, Dr Steve Luking, Dr.Scott Luking    

## 2015-04-27 NOTE — Progress Notes (Signed)
   Subjective:    Patient ID: Adriana [name], female    DOB: September 16, 1960, 54 y.o.   MRN: [id number]  Cough This is a new problem. Episode onset: 6 days ago. Associated symptoms include ear pain, headaches, nasal congestion, rhinorrhea, a sore throat and wheezing. Pertinent negatives include no chest pain, fever or shortness of breath.   Chest heavy DOE on physical exam no wheezing or difficulty breathing on today's exam with her sitting still walking in the hallway. She relates she is just notice getting short of breath if she pushes herself since been sick denies high fever chills   She does relate frequent reflux she does not smoke she does drink some caffeine in the morning she does not proper supple   Review of Systems  Constitutional: Negative for fever and activity change.  HENT: Positive for congestion, ear pain, rhinorrhea and sore throat.   Eyes: Negative for discharge.  Respiratory: Positive for cough and wheezing. Negative for shortness of breath.   Cardiovascular: Negative for chest pain.  Neurological: Positive for headaches.       Objective:   Physical Exam  Constitutional: She appears well-developed.  HENT:  Head: Normocephalic.  Nose: Nose normal.  Mouth/Throat: Oropharynx is clear and moist. No oropharyngeal exudate.  Neck: Neck supple.  Cardiovascular: Normal rate and normal heart sounds.   No murmur heard. Pulmonary/Chest: Effort normal and breath sounds normal. She has no wheezes.  Lymphadenopathy:    She has no cervical adenopathy.  Skin: Skin is warm and dry.  Nursing note and vitals reviewed.     behavioral measures were discussed in detail on how to minimize reflux warning signs such as dysphagia or ongoing pain recommended to have follow-up and referral to gastroenterology     Assessment & Plan:   rhinosinusitis antibiotics prescribed warning signs discussed  bronchitis probably viral could last for another 1-2 weeks OTC measures for cough   no sign and pneumonia if high fevers difficulty breathing or worse follow-up  reflux Zantac 300 milligrams preventative measures discuss follow-up if ongoing trouble Wellness exam recommended.

## 2015-04-28 ENCOUNTER — Ambulatory Visit (INDEPENDENT_AMBULATORY_CARE_PROVIDER_SITE_OTHER): Payer: BLUE CROSS/BLUE SHIELD

## 2015-04-28 ENCOUNTER — Ambulatory Visit (INDEPENDENT_AMBULATORY_CARE_PROVIDER_SITE_OTHER): Payer: BLUE CROSS/BLUE SHIELD | Admitting: Obstetrics and Gynecology

## 2015-04-28 ENCOUNTER — Encounter: Payer: Self-pay | Admitting: Obstetrics and Gynecology

## 2015-04-28 VITALS — BP 138/90 | HR 80 | Resp 16 | Ht 66.25 in | Wt 265.0 lb

## 2015-04-28 DIAGNOSIS — N950 Postmenopausal bleeding: Secondary | ICD-10-CM

## 2015-04-28 DIAGNOSIS — D259 Leiomyoma of uterus, unspecified: Secondary | ICD-10-CM | POA: Diagnosis not present

## 2015-04-28 DIAGNOSIS — N939 Abnormal uterine and vaginal bleeding, unspecified: Secondary | ICD-10-CM | POA: Diagnosis not present

## 2015-04-28 NOTE — Progress Notes (Signed)
Subjective  54 y.o. G61P1011  female here for pelvic ultrasound for recent bleeding episode following progesterone challenge.  History is as follows:  No menses for about one year.  Prior to that had one every 3 - 4 months.  Menses from 2006 - 2010 were extremely heavy.  No know history of fibroids.   Saw Dr. Sharren Bridge who did comprehensive blood work.  He suggested that patient suggested HRT. Patient has been battling fatigue and depression and not sleeping well, so he thought she would benefit from the hormones. Used the Estradiol patch and Prometrium for a couple of weeks.  Then saw Edman Circle for her usual annual exam who reviewed her 12/21/14 labs from Dr. Anastasio Champion showing [place] 48 and estradiol 32.8, and she received Rx for Prometrium 200 mg for 14 days.  Patient had a withdrawal bleed.  We instructed patient to not take any further hormone treatment and we decided to recheck her hormonal status again here. Labs from 04/08/15 showed FSH 43.8 and estradiol 21.4. She was instructed to come in for evaluation with ultrasound and EMB.  Has life long depression.  Situational stress. Denies suicidal ideation.   She really prefers not to take menopausal hormonal therapy.   Objective  Pelvic ultrasound images and report reviewed with patient.  Uterus -  Fibroids - 1.6, 1.9, and 2.1 cm and adenomyosis noted. EMS - 5.54 mm. Ovaries - normal.  Free fluid - no         Procedure - endometrial biopsy Consent performed. Speculum place in vagina.  Sterile prep of cervix with Hibiclens. Tenaculum to anterior cervical lip. Pipelle placed to   8       cm without difficulty twice. Tissue obtained and sent to pathology. Speculum removed.  No complications. Minimal EBL.  Assessment   Perimenopausal female.  Technically, her [place] and estradiol are now consistent with menopausal range. Progesterone withdrawal bleed. Borderline thickened endometrium.  Fibroids.  Adenomyosis on  ultrasound.  Chronic depression.   Plan  Discussion of perimenopause and menopause. Follow up EMB. Instructions and precautions are given.  Prefers to be off HRT.  I told her this was OK.  Declines referral for depression care.  ___15____ minutes face to face time of which over 50% was spent in counseling.   After visit summary to patient.

## 2015-04-28 NOTE — Patient Instructions (Signed)

## 2015-05-02 LAB — IPS OTHER TISSUE BIOPSY

## 2015-05-04 ENCOUNTER — Telehealth: Payer: Self-pay | Admitting: Emergency Medicine

## 2015-05-04 NOTE — Telephone Encounter (Signed)
-----   Message from Nunzio Cobbs, MD sent at 05/02/2015  7:34 PM EST ----- Please report to the patient benign proliferative endometrium and benign endocervical tissue.  This correlates with her of EMB 5.54 mm on pelvic ultrasound.  She also had signs of fibroids and adenomyosis.   She prefers to be off HRT, and I think that this is OK. Her labs are transitioning from perimenopause into a more menopausal range now.  Please have her call for any future bleeding or spotting.   Cc- Tawanna Solo

## 2015-05-04 NOTE — Telephone Encounter (Signed)
Message left to return call to Adriana [name] at [phone].    

## 2015-05-19 NOTE — Telephone Encounter (Signed)
Message left to return call to Dilana Mcphie at [phone].    

## 2015-05-20 NOTE — Telephone Encounter (Signed)
-----   Message from Nunzio Cobbs, MD sent at 05/02/2015  7:34 PM EST ----- Please report to the patient benign proliferative endometrium and benign endocervical tissue.  This correlates with her of EMB 5.54 mm on pelvic ultrasound.  She also had signs of fibroids and adenomyosis.   She prefers to be off HRT, and I think that this is OK. Her labs are transitioning from perimenopause into a more menopausal range now.  Please have her call for any future bleeding or spotting.   Cc- Tawanna Solo

## 2015-05-20 NOTE — Telephone Encounter (Signed)
Patient returned call and results from Dr. Quincy Simmonds discussed. Patient verbalized understanding and will call back with any future bleeding or any concerns.   Routing to provider for final review. Patient agreeable to disposition. Will close encounter.

## 2016-01-23 DIAGNOSIS — D225 Melanocytic nevi of trunk: Secondary | ICD-10-CM | POA: Diagnosis not present

## 2016-01-23 DIAGNOSIS — L570 Actinic keratosis: Secondary | ICD-10-CM | POA: Diagnosis not present

## 2016-01-23 DIAGNOSIS — D485 Neoplasm of uncertain behavior of skin: Secondary | ICD-10-CM | POA: Diagnosis not present

## 2016-01-23 DIAGNOSIS — L821 Other seborrheic keratosis: Secondary | ICD-10-CM | POA: Diagnosis not present

## 2016-01-23 DIAGNOSIS — X32XXXA Exposure to sunlight, initial encounter: Secondary | ICD-10-CM | POA: Diagnosis not present

## 2016-01-23 DIAGNOSIS — Z1283 Encounter for screening for malignant neoplasm of skin: Secondary | ICD-10-CM | POA: Diagnosis not present

## 2016-02-01 ENCOUNTER — Other Ambulatory Visit: Payer: Self-pay | Admitting: Nurse Practitioner

## 2016-02-01 ENCOUNTER — Ambulatory Visit (INDEPENDENT_AMBULATORY_CARE_PROVIDER_SITE_OTHER): Payer: BLUE CROSS/BLUE SHIELD | Admitting: Nurse Practitioner

## 2016-02-01 ENCOUNTER — Encounter: Payer: Self-pay | Admitting: Nurse Practitioner

## 2016-02-01 VITALS — BP 110/70 | HR 80 | Resp 14 | Ht 66.0 in | Wt 263.6 lb

## 2016-02-01 DIAGNOSIS — Z1211 Encounter for screening for malignant neoplasm of colon: Secondary | ICD-10-CM

## 2016-02-01 DIAGNOSIS — N951 Menopausal and female climacteric states: Secondary | ICD-10-CM | POA: Diagnosis not present

## 2016-02-01 DIAGNOSIS — Z113 Encounter for screening for infections with a predominantly sexual mode of transmission: Secondary | ICD-10-CM | POA: Diagnosis not present

## 2016-02-01 DIAGNOSIS — Z0000 Encounter for general adult medical examination without abnormal findings: Secondary | ICD-10-CM

## 2016-02-01 DIAGNOSIS — Z01419 Encounter for gynecological examination (general) (routine) without abnormal findings: Secondary | ICD-10-CM | POA: Diagnosis not present

## 2016-02-01 LAB — POCT URINALYSIS DIPSTICK
Bilirubin, UA: NEGATIVE
GLUCOSE UA: NEGATIVE
Ketones, UA: NEGATIVE
NITRITE UA: NEGATIVE
PH UA: 6
Protein, UA: NEGATIVE
RBC UA: NEGATIVE
UROBILINOGEN UA: NEGATIVE

## 2016-02-01 LAB — CBC WITH DIFFERENTIAL/PLATELET
Basophils Absolute: 0 cells/uL (ref 0–200)
Basophils Relative: 0 %
EOS ABS: 104 {cells}/uL (ref 15–500)
Eosinophils Relative: 2 %
HEMATOCRIT: 38 % (ref 35.0–45.0)
HEMOGLOBIN: 12.4 g/dL (ref 11.7–15.5)
LYMPHS PCT: 33 %
Lymphs Abs: 1716 cells/uL (ref 850–3900)
MCH: 29.7 pg (ref 27.0–33.0)
MCHC: 32.6 g/dL (ref 32.0–36.0)
MCV: 90.9 fL (ref 80.0–100.0)
MONO ABS: 312 {cells}/uL (ref 200–950)
MPV: 10.2 fL (ref 7.5–12.5)
Monocytes Relative: 6 %
NEUTROS PCT: 59 %
Neutro Abs: 3068 cells/uL (ref 1500–7800)
Platelets: 250 10*3/uL (ref 140–400)
RBC: 4.18 MIL/uL (ref 3.80–5.10)
RDW: 14 % (ref 11.0–15.0)
WBC: 5.2 10*3/uL (ref 3.8–10.8)

## 2016-02-01 NOTE — Patient Instructions (Signed)

## 2016-02-01 NOTE — Progress Notes (Signed)
Patient ID: Adriana [name], female   DOB: 12-24-1960, 55 y.o.   MRN: [id number]  55 y.o. G36P1011 Divorced  Caucasian Fe here for annual exam.  She has had no further vaginal spotting.  She had Provera challenge with light bleeding followed by PUS showing several fibroids and adenomyosis 04/28/15.  The EMB showed benign proliferative endo and FSH was 43.8. Dr. Quincy Simmonds advised to stop her HRT and she did.   Some vaso symptoms but tolerable.  Still claims she is a 'mess' but more from emotional symptoms.   She is still seeing same partner.  Recently had symptoms of GERD and pressure in her chest.  Her father had history of colon cancer. Will get GI referral.  Patient's last menstrual period was 01/16/2014.          Sexually active: Yes.    The current method of family planning is post menopausal status.   Same partner x 8 years ? Yes Exercising: No.  The patient does not participate in regular exercise at present. Smoker:  no  Health Maintenance: Pap: 01/26/15, negative with neg HR HPV MMG:  25 years ago- normal; Thermommagraphy 8 yrs ago Colonoscopy:  Never had one - will schedule TDaP:  Is due  Hep C: done today HIV: 01/26/15 Labs: Blood was drawn  Urine: 1+ Leuk's   reports that she has never smoked. She has never used smokeless tobacco. She reports that she drinks alcohol. She reports that she does not use drugs.  Past Medical History:  Diagnosis Date  . Anxiety   . Benign positional vertigo 12/19/13  . Depression   . Insomnia   . Migraine   . Plantar fasciitis   . Reflux     Past Surgical History:  Procedure Laterality Date  . None      Current Outpatient Prescriptions  Medication Sig Dispense Refill  . cholecalciferol (VITAMIN D) 1000 UNITS tablet Take 1,000 Units by mouth daily.    . ranitidine (ZANTAC) 300 MG tablet Take 1 tablet (300 mg total) by mouth at bedtime. 30 tablet 5   No current facility-administered medications for this visit.     Family History  Problem  Relation Age of Onset  . Colon cancer Father     diagnosed in late 46s.   . Hypertension Father   . Heart attack Father     ROS:  Pertinent items are noted in HPI.  Otherwise, a comprehensive ROS was negative.  Exam:   BP 110/70   Pulse 80   Resp 14   Ht 5\' 6"  (1.676 m)   Wt 263 lb 9.6 oz (119.6 kg)   LMP 01/16/2014   BMI 42.55 kg/m  Height: 5\' 6"  (167.6 cm) Ht Readings from Last 3 Encounters:  02/01/16 5\' 6"  (1.676 m)  04/28/15 5' 6.25" (1.683 m)  04/27/15 5' 6.5" (1.689 m)    General appearance: alert, cooperative and appears stated age Head: Normocephalic, without obvious abnormality, atraumatic Neck: no adenopathy, supple, symmetrical, trachea midline and thyroid normal to inspection and palpation Lungs: clear to auscultation bilaterally Breasts: normal appearance, no masses or tenderness Heart: regular rate and rhythm Abdomen: soft, non-tender; no masses,  no organomegaly Extremities: extremities normal, atraumatic, no cyanosis or edema Skin: Skin color, texture, turgor normal. No rashes or lesions Lymph nodes: Cervical, supraclavicular, and axillary nodes normal. No abnormal inguinal nodes palpated Neurologic: Grossly normal   Pelvic: External genitalia:  no lesions  Urethra:  normal appearing urethra with no masses, tenderness or lesions              Bartholin's and Skene's: normal                 Vagina: normal appearing vagina with normal color and white thin discharge, no lesions              Cervix: anteverted              Pap taken: No. Bimanual Exam:  Uterus:  normal size, contour, position, consistency, mobility, non-tender              Adnexa: no mass, fullness, tenderness               Rectovaginal: Confirms               Anus:  normal sphincter tone, no lesions  Chaperone present: yes  A:  Well Woman with normal exam             Peri - postmenopausal with FSH at only 43.8 on 10/16             Need for Mammogram               BMI -  42.55  R/O vaginitis  FMH:  Father with colon cancer - needs GI referral  Situational stressors    P:   Reviewed health and wellness pertinent to exam  Pap smear as above  Mammogram is due and she is aware of our concerns  We will schedule colonoscopy referral  Follow with labs  Counseled on breast self exam, mammography screening, STD prevention, adequate intake of calcium and vitamin D, diet and exercise return annually or prn  An After Visit Summary was printed and given to the patient.

## 2016-02-02 ENCOUNTER — Other Ambulatory Visit: Payer: Self-pay | Admitting: Certified Nurse Midwife

## 2016-02-02 DIAGNOSIS — B9689 Other specified bacterial agents as the cause of diseases classified elsewhere: Secondary | ICD-10-CM

## 2016-02-02 DIAGNOSIS — N760 Acute vaginitis: Principal | ICD-10-CM

## 2016-02-02 LAB — COMPREHENSIVE METABOLIC PANEL
ALT: 14 U/L (ref 6–29)
AST: 15 U/L (ref 10–35)
Albumin: 4.2 g/dL (ref 3.6–5.1)
Alkaline Phosphatase: 69 U/L (ref 33–130)
BUN: 12 mg/dL (ref 7–25)
CHLORIDE: 102 mmol/L (ref 98–110)
CO2: 24 mmol/L (ref 20–31)
CREATININE: 0.68 mg/dL (ref 0.50–1.05)
Calcium: 9.3 mg/dL (ref 8.6–10.4)
GLUCOSE: 108 mg/dL — AB (ref 65–99)
Potassium: 3.6 mmol/L (ref 3.5–5.3)
SODIUM: 140 mmol/L (ref 135–146)
Total Bilirubin: 0.4 mg/dL (ref 0.2–1.2)
Total Protein: 7.2 g/dL (ref 6.1–8.1)

## 2016-02-02 LAB — LIPID PANEL
CHOL/HDL RATIO: 3.2 ratio (ref <=5.0)
Cholesterol: 171 mg/dL (ref 125–200)
HDL: 54 mg/dL (ref >=46)
LDL CALC: 96 mg/dL (ref <130)
Triglycerides: 104 mg/dL (ref <150)
VLDL: 21 mg/dL (ref <30)

## 2016-02-02 LAB — THYROID PANEL WITH TSH
Free Thyroxine Index: 2.1 (ref 1.4–3.8)
T3 Uptake: 27 % (ref 22–35)
T4 TOTAL: 7.6 ug/dL (ref 4.5–12.0)
TSH: 1.98 m[IU]/L

## 2016-02-02 LAB — HEPATITIS C ANTIBODY: HCV Ab: NEGATIVE

## 2016-02-02 LAB — FOLLICLE STIMULATING HORMONE: FSH: 40.9 m[IU]/mL

## 2016-02-02 LAB — WET PREP BY MOLECULAR PROBE
CANDIDA SPECIES: NEGATIVE
Gardnerella vaginalis: POSITIVE — AB
TRICHOMONAS VAG: NEGATIVE

## 2016-02-02 LAB — VITAMIN D 25 HYDROXY (VIT D DEFICIENCY, FRACTURES): VIT D 25 HYDROXY: 26 ng/mL — AB (ref 30–100)

## 2016-02-02 MED ORDER — METRONIDAZOLE 0.75 % VA GEL: 1.0000 | Freq: Two times a day (BID) | VAGINAL | 0 refills | Status: DC

## 2016-02-03 ENCOUNTER — Telehealth: Payer: Self-pay | Admitting: *Deleted

## 2016-02-03 LAB — STD PANEL
HEP B S AG: NEGATIVE
HIV: NONREACTIVE

## 2016-02-03 NOTE — Telephone Encounter (Signed)
I have attempted to contact this patient by phone with the following results: left message to return call to [place] at [phone] on answering machine (mobile per [facility]).  No personal information given.  [phone] (Mobile) *Preferred*

## 2016-02-03 NOTE — Telephone Encounter (Signed)
Pt notified in result note.  Closing encounter. 

## 2016-02-03 NOTE — Progress Notes (Signed)
Reviewed personally.  M. Suzanne Caitlan Chauca, MD.  

## 2016-02-03 NOTE — Telephone Encounter (Signed)
-----   Message from Kem Boroughs, [place] sent at 02/03/2016  8:17 AM EDT ----- Please let pt know the STD panel HIV, Hep B, STS is negative.  Results on second page.  The Hep C also negative as expected. Her Vit D was low at 28 - she needs to take OTC Vit D 2000 IU daily as current dose is not enough.  The Lipid panel, kidney, liver and CBC is good.  Blood sugar slight up but she was non fasting.  FSH is still in the menopausal range.  She is to call if any vaginal bleeding.  The thyroid panel was great. Affirm is already discussed with pt. Does she need for [place] to schedule her Mammo for her?  She must get this done soon.

## 2016-04-10 DIAGNOSIS — K219 Gastro-esophageal reflux disease without esophagitis: Secondary | ICD-10-CM | POA: Diagnosis not present

## 2016-04-10 DIAGNOSIS — Z800 Family history of malignant neoplasm of digestive organs: Secondary | ICD-10-CM | POA: Diagnosis not present

## 2016-04-10 DIAGNOSIS — K5900 Constipation, unspecified: Secondary | ICD-10-CM | POA: Diagnosis not present

## 2016-04-10 DIAGNOSIS — Z1211 Encounter for screening for malignant neoplasm of colon: Secondary | ICD-10-CM | POA: Diagnosis not present

## 2016-06-19 ENCOUNTER — Encounter: Payer: Self-pay | Admitting: Family Medicine

## 2016-06-19 ENCOUNTER — Ambulatory Visit (INDEPENDENT_AMBULATORY_CARE_PROVIDER_SITE_OTHER): Payer: BLUE CROSS/BLUE SHIELD | Admitting: Family Medicine

## 2016-06-19 VITALS — BP 116/74 | Temp 98.9°F | Ht 66.5 in | Wt 265.0 lb

## 2016-06-19 DIAGNOSIS — J019 Acute sinusitis, unspecified: Secondary | ICD-10-CM

## 2016-06-19 DIAGNOSIS — B349 Viral infection, unspecified: Secondary | ICD-10-CM | POA: Diagnosis not present

## 2016-06-19 DIAGNOSIS — B9689 Other specified bacterial agents as the cause of diseases classified elsewhere: Secondary | ICD-10-CM

## 2016-06-19 MED ORDER — AMOXICILLIN-POT CLAVULANATE 875-125 MG PO TABS: 1.0000 | ORAL_TABLET | Freq: Two times a day (BID) | ORAL | 0 refills | Status: DC

## 2016-06-19 NOTE — Progress Notes (Signed)
   Subjective:    Patient ID: Adriana [name], female    DOB: 1960-09-21, 56 y.o.   MRN: [id number]  Sinusitis  This is a new problem. Episode onset: 5 days. Associated symptoms include congestion, coughing, ear pain, headaches and a sore throat. Pertinent negatives include no shortness of breath. Treatments tried: ginger, lemon, hot tea.   Viral like illness for several days then sore throat loss of voice not feeling good denies high fever chills   Review of Systems  Constitutional: Negative for activity change and fever.  HENT: Positive for congestion, ear pain, rhinorrhea, sore throat and voice change.   Eyes: Negative for discharge.  Respiratory: Positive for cough. Negative for shortness of breath and wheezing.   Cardiovascular: Negative for chest pain.  Neurological: Positive for headaches.       Objective:   Physical Exam  Constitutional: She appears well-developed.  HENT:  Head: Normocephalic.  Nose: Nose normal.  Mouth/Throat: Oropharynx is clear and moist. No oropharyngeal exudate.  Neck: Neck supple.  Cardiovascular: Normal rate and normal heart sounds.   No murmur heard. Pulmonary/Chest: Effort normal and breath sounds normal. She has no wheezes.  Lymphadenopathy:    She has no cervical adenopathy.  Skin: Skin is warm and dry.  Nursing note and vitals reviewed.         Assessment & Plan:  Viral syndrome Secondary rhinosinusitis Antibiotic prescribed warning signs discussed follow-up if

## 2016-10-01 ENCOUNTER — Ambulatory Visit (INDEPENDENT_AMBULATORY_CARE_PROVIDER_SITE_OTHER): Payer: BLUE CROSS/BLUE SHIELD | Admitting: Family Medicine

## 2016-10-01 ENCOUNTER — Encounter: Payer: Self-pay | Admitting: Family Medicine

## 2016-10-01 VITALS — BP 132/80 | Ht 66.5 in | Wt 269.0 lb

## 2016-10-01 DIAGNOSIS — M545 Low back pain: Secondary | ICD-10-CM

## 2016-10-01 LAB — POCT URINALYSIS DIPSTICK: pH, UA: 5 (ref 5.0–8.0)

## 2016-10-01 NOTE — Progress Notes (Signed)
   Subjective:    Patient ID: Adriana [name], female    DOB: January 22, 1961, 56 y.o.   MRN: [id number]  HPI The patient comes in today for a wellness visit.    A review of their health history was completed.  A review of medications was also completed.  Any needed refills; no  Eating habits: health conscious  Falls/  MVA accidents in past few months: none  Regular exercise: no  Specialist pt sees on regular basis: none  Preventative health issues were discussed.   Additional concerns: hands tingling and going numb  Fatigue. Has always had  Insomnia for a long time.   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 No otc meds for it   Pt wanted to hold off on colonoscopy   Hair falling out.   Long hx of depr and anxiety tries to avoid med because does not want to          Review of Systems     Objective:   Physical Exam        Assessment & Plan:

## 2016-10-01 NOTE — Patient Instructions (Signed)
Carpal Tunnel Syndrome Carpal tunnel syndrome is a condition that causes pain in your hand and arm. The carpal tunnel is a narrow area located on the palm side of your wrist. Repeated wrist motion or certain diseases may cause swelling within the tunnel. This swelling pinches the main nerve in the wrist (median nerve). What are the causes? This condition may be caused by:  Repeated wrist motions.  Wrist injuries.  Arthritis.  A cyst or tumor in the carpal tunnel.  Fluid buildup during pregnancy.  Sometimes the cause of this condition is not known. What increases the risk? This condition is more likely to develop in:  People who have jobs that cause them to repeatedly move their wrists in the same motion, such as butchers and cashiers.  Women.  People with certain conditions, such as: ? Diabetes. ? Obesity. ? An underactive thyroid (hypothyroidism). ? Kidney failure.  What are the signs or symptoms? Symptoms of this condition include:  A tingling feeling in your fingers, especially in your thumb, index, and middle fingers.  Tingling or numbness in your hand.  An aching feeling in your entire arm, especially when your wrist and elbow are bent for long periods of time.  Wrist pain that goes up your arm to your shoulder.  Pain that goes down into your palm or fingers.  A weak feeling in your hands. You may have trouble grabbing and holding items.  Your symptoms may feel worse during the night. How is this diagnosed? This condition is diagnosed with a medical history and physical exam. You may also have tests, including:  An electromyogram (EMG). This test measures electrical signals sent by your nerves into the muscles.  X-rays.  How is this treated? Treatment for this condition includes:  Lifestyle changes. It is important to stop doing or modify the activity that caused your condition.  Physical or occupational therapy.  Medicines for pain and inflammation.  This may include medicine that is injected into your wrist.  A wrist splint.  Surgery.  Follow these instructions at home: If you have a splint:  Wear it as told by your health care provider. Remove it only as told by your health care provider.  Loosen the splint if your fingers become numb and tingle, or if they turn cold and blue.  Keep the splint clean and dry. General instructions  Take over-the-counter and prescription medicines only as told by your health care provider.  Rest your wrist from any activity that may be causing your pain. If your condition is work related, talk to your employer about changes that can be made, such as getting a wrist pad to use while typing.  If directed, apply ice to the painful area: ? Put ice in a plastic bag. ? Place a towel between your skin and the bag. ? Leave the ice on for 20 minutes, 2-3 times per day.  Keep all follow-up visits as told by your health care provider. This is important.  Do any exercises as told by your health care provider, physical therapist, or occupational therapist. Contact a health care provider if:  You have new symptoms.  Your pain is not controlled with medicines.  Your symptoms get worse. This information is not intended to replace advice given to you by your health care provider. Make sure you discuss any questions you have with your health care provider. Document Released: 06/01/2000 Document Revised: 10/13/2015 Document Reviewed: 10/20/2014 Elsevier Interactive Patient Education  2017 Elsevier Inc.  

## 2017-02-04 ENCOUNTER — Ambulatory Visit: Payer: BLUE CROSS/BLUE SHIELD | Admitting: Nurse Practitioner

## 2017-02-12 DIAGNOSIS — M5413 Radiculopathy, cervicothoracic region: Secondary | ICD-10-CM | POA: Diagnosis not present

## 2017-02-12 DIAGNOSIS — M9901 Segmental and somatic dysfunction of cervical region: Secondary | ICD-10-CM | POA: Diagnosis not present

## 2017-02-12 DIAGNOSIS — M9902 Segmental and somatic dysfunction of thoracic region: Secondary | ICD-10-CM | POA: Diagnosis not present

## 2017-02-12 DIAGNOSIS — M546 Pain in thoracic spine: Secondary | ICD-10-CM | POA: Diagnosis not present

## 2017-02-14 DIAGNOSIS — M9901 Segmental and somatic dysfunction of cervical region: Secondary | ICD-10-CM | POA: Diagnosis not present

## 2017-02-14 DIAGNOSIS — M5413 Radiculopathy, cervicothoracic region: Secondary | ICD-10-CM | POA: Diagnosis not present

## 2017-02-14 DIAGNOSIS — M9902 Segmental and somatic dysfunction of thoracic region: Secondary | ICD-10-CM | POA: Diagnosis not present

## 2017-02-14 DIAGNOSIS — M546 Pain in thoracic spine: Secondary | ICD-10-CM | POA: Diagnosis not present

## 2017-03-26 ENCOUNTER — Ambulatory Visit (INDEPENDENT_AMBULATORY_CARE_PROVIDER_SITE_OTHER): Payer: BLUE CROSS/BLUE SHIELD | Admitting: Obstetrics and Gynecology

## 2017-03-26 ENCOUNTER — Other Ambulatory Visit (HOSPITAL_COMMUNITY)
Admission: RE | Admit: 2017-03-26 | Discharge: 2017-03-26 | Disposition: A | Discharge status: Home / Self Care | Payer: BLUE CROSS/BLUE SHIELD | Source: Ambulatory Visit | Attending: Obstetrics and Gynecology | Admitting: Obstetrics and Gynecology

## 2017-03-26 ENCOUNTER — Encounter: Payer: Self-pay | Admitting: Obstetrics and Gynecology

## 2017-03-26 VITALS — BP 112/80 | HR 66 | Resp 18 | Ht 66.0 in | Wt 267.0 lb

## 2017-03-26 DIAGNOSIS — R350 Frequency of micturition: Secondary | ICD-10-CM | POA: Insufficient documentation

## 2017-03-26 DIAGNOSIS — Z23 Encounter for immunization: Secondary | ICD-10-CM

## 2017-03-26 DIAGNOSIS — R631 Polydipsia: Secondary | ICD-10-CM | POA: Diagnosis not present

## 2017-03-26 DIAGNOSIS — N950 Postmenopausal bleeding: Secondary | ICD-10-CM | POA: Diagnosis not present

## 2017-03-26 DIAGNOSIS — E559 Vitamin D deficiency, unspecified: Secondary | ICD-10-CM | POA: Diagnosis not present

## 2017-03-26 DIAGNOSIS — Z113 Encounter for screening for infections with a predominantly sexual mode of transmission: Secondary | ICD-10-CM | POA: Insufficient documentation

## 2017-03-26 DIAGNOSIS — R5383 Other fatigue: Secondary | ICD-10-CM

## 2017-03-26 DIAGNOSIS — Z0000 Encounter for general adult medical examination without abnormal findings: Secondary | ICD-10-CM

## 2017-03-26 DIAGNOSIS — Z124 Encounter for screening for malignant neoplasm of cervix: Secondary | ICD-10-CM | POA: Insufficient documentation

## 2017-03-26 DIAGNOSIS — E663 Overweight: Secondary | ICD-10-CM

## 2017-03-26 DIAGNOSIS — Z01419 Encounter for gynecological examination (general) (routine) without abnormal findings: Secondary | ICD-10-CM

## 2017-03-26 DIAGNOSIS — N898 Other specified noninflammatory disorders of vagina: Secondary | ICD-10-CM

## 2017-03-26 LAB — POCT URINALYSIS DIPSTICK
Bilirubin, UA: NEGATIVE
Blood, UA: NEGATIVE
Glucose, UA: NEGATIVE
KETONES UA: NEGATIVE
LEUKOCYTES UA: NEGATIVE
Nitrite, UA: NEGATIVE
PH UA: 6.5 (ref 5.0–8.0)
PROTEIN UA: NEGATIVE
Urobilinogen, UA: NEGATIVE E.U./dL — AB

## 2017-03-26 NOTE — Patient Instructions (Signed)

## 2017-03-26 NOTE — Progress Notes (Signed)
56 y.o. [id number] DivorcedCaucasianF here for annual exam.  She had one episode of spotting 2 weeks ago. Prior to that she hasn't bleed for a couple of years.  Sexually active, same partner x 9 years. No dyspareunia, no vaginal dryness. She does c/o some increase in vaginal discharge since last year. She thinks she may still have BV. She has a vaginal odor off and on. She desires STD testing.  She would like screening labs. She is tired all the time. Not sleeping well. No night sweats, hot a lot of the time.  She does c/o excessive thirst. She has a life long h/o depression. She manages it, stays busy. Tolerable, declines medication. No thoughts of hurting herself or others.  She has a h/o vit d def, takes her vit D about 5 days a week.     Patient's last menstrual period was 01/16/2014.          Sexually active: Yes.    The current method of family planning is post menopausal status.    Exercising: No.  The patient does not participate in regular exercise at present. Smoker:  no  Health Maintenance: Pap:  01/26/15 neg. HR HPV:neg  History of abnormal Pap: yes - years ago- had colposcopy  MMG:  25 years ago  Colonoscopy:  Never BMD:   Never TDaP:  Overdue    reports that she has never smoked. She has never used smokeless tobacco. She reports that she drinks alcohol. She reports that she does not use drugs. Rare ETOH. 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   Past Medical History:  Diagnosis Date  . Anxiety   . Benign positional vertigo 12/19/13  . Depression   . Insomnia   . Migraine   . Plantar fasciitis   . Reflux     Past Surgical History:  Procedure Laterality Date  . None      Current Outpatient Prescriptions  Medication Sig Dispense Refill  . cholecalciferol (VITAMIN D) 1000 UNITS tablet Take 1,000 Units by mouth daily.     No current facility-administered medications for this visit.     Family History  Problem Relation Age of Onset  . Colon  cancer Father        diagnosed in late 35s.   . Hypertension Father   . Heart attack Father     Review of Systems  Constitutional: Positive for fatigue.  HENT: Negative.   Eyes: Negative.   Respiratory: Negative.   Cardiovascular:       Bilateral ankle swelling   Gastrointestinal: Negative.   Endocrine: Positive for heat intolerance.  Genitourinary: Positive for frequency and vaginal discharge.       Pink spottingX 1 day - 2 weeks ago  Night urination   Musculoskeletal: Negative.   Skin: Negative.   Allergic/Immunologic: Negative.   Neurological: Negative.   Psychiatric/Behavioral: Negative.   Up at least least 2 x a night to void. Voids large amounts.   Exam:   BP 112/80 (BP Location: Right Arm, Patient Position: Sitting, Cuff Size: Large)   Pulse 66   Resp 18   Ht 5\' 6"  (1.676 m)   Wt 267 lb (121.1 kg)   LMP 01/16/2014   BMI 43.09 kg/m   Weight change: @WEIGHTCHANGE @ Height:   Height: 5\' 6"  (167.6 cm)  Ht Readings from Last 3 Encounters:  03/26/17 5\' 6"  (1.676 m)  10/01/16 5' 6.5" (1.689 m)  06/19/16 5' 6.5" (1.689 m)    General appearance:  alert, cooperative and appears stated age Head: Normocephalic, without obvious abnormality, atraumatic Neck: no adenopathy, supple, symmetrical, trachea midline and thyroid normal to inspection and palpation Lungs: clear to auscultation bilaterally Cardiovascular: regular rate and rhythm Breasts: normal appearance, no masses or tenderness Abdomen: soft, non-tender; non distended,  no masses,  no organomegaly Extremities: extremities normal, atraumatic, no cyanosis or edema Skin: Skin color, texture, turgor normal. No rashes or lesions Lymph nodes: Cervical, supraclavicular, and axillary nodes normal. No abnormal inguinal nodes palpated Neurologic: Grossly normal   Pelvic: External genitalia:  no lesions              Urethra:  normal appearing urethra with no masses, tenderness or lesions              Bartholins and Skenes:  normal                 Vagina: normal appearing vagina with normal color and discharge, no lesions              Cervix: no lesions               Bimanual Exam:  Uterus:  normal size, contour, position, consistency, mobility, non-tender and anteverted              Adnexa: no mass, fullness, tenderness               Rectovaginal: Confirms               Anus:  normal sphincter tone, no lesions  Chaperone was present for exam.  A:  Well Woman with normal exam  Postmenopausal bleeding  Vaginal odor, intermittent  Overweight  Urinary frequency, normal urine dip  Excessive thirst,  Fatigue  Vit d def    P:   Screening labs, TSH, Vit D, HgbA1C  HIV, PRP, GC/chlamydia  Pap  Affirm  TDAP  Mammogram encouraged, # given  Discussed colonoscopy, she will set it up  She is on vit d  Discussed breast self awareness  Return for gyn ultrasound, possible endometrial biopsy

## 2017-03-27 LAB — COMPREHENSIVE METABOLIC PANEL
A/G RATIO: 1.6 (ref 1.2–2.2)
ALBUMIN: 4.5 g/dL (ref 3.5–5.5)
ALT: 15 IU/L (ref 0–32)
AST: 16 IU/L (ref 0–40)
Alkaline Phosphatase: 77 IU/L (ref 39–117)
BUN/Creatinine Ratio: 13 (ref 9–23)
BUN: 9 mg/dL (ref 6–24)
Bilirubin Total: 0.3 mg/dL (ref 0.0–1.2)
CALCIUM: 9 mg/dL (ref 8.7–10.2)
CO2: 23 mmol/L (ref 20–29)
Chloride: 103 mmol/L (ref 96–106)
Creatinine, Ser: 0.71 mg/dL (ref 0.57–1.00)
GFR, EST AFRICAN AMERICAN: 112 mL/min/{1.73_m2} (ref >59)
GFR, EST NON AFRICAN AMERICAN: 97 mL/min/{1.73_m2} (ref >59)
GLOBULIN, TOTAL: 2.8 g/dL (ref 1.5–4.5)
Glucose: 82 mg/dL (ref 65–99)
POTASSIUM: 3.9 mmol/L (ref 3.5–5.2)
SODIUM: 143 mmol/L (ref 134–144)
TOTAL PROTEIN: 7.3 g/dL (ref 6.0–8.5)

## 2017-03-27 LAB — RPR: RPR Ser Ql: NONREACTIVE

## 2017-03-27 LAB — LIPID PANEL
Chol/HDL Ratio: 3.3 ratio (ref 0.0–4.4)
Cholesterol, Total: 169 mg/dL (ref 100–199)
HDL: 51 mg/dL (ref >39)
LDL CALC: 98 mg/dL (ref 0–99)
Triglycerides: 101 mg/dL (ref 0–149)
VLDL CHOLESTEROL CAL: 20 mg/dL (ref 5–40)

## 2017-03-27 LAB — TSH: TSH: 1.89 u[IU]/mL (ref 0.450–4.500)

## 2017-03-27 LAB — CBC
Hematocrit: 37.9 % (ref 34.0–46.6)
Hemoglobin: 12.5 g/dL (ref 11.1–15.9)
MCH: 30 pg (ref 26.6–33.0)
MCHC: 33 g/dL (ref 31.5–35.7)
MCV: 91 fL (ref 79–97)
Platelets: 233 10*3/uL (ref 150–379)
RBC: 4.17 x10E6/uL (ref 3.77–5.28)
RDW: 14.1 % (ref 12.3–15.4)
WBC: 4.4 10*3/uL (ref 3.4–10.8)

## 2017-03-27 LAB — VAGINITIS/VAGINOSIS, DNA PROBE
CANDIDA SPECIES: NEGATIVE
GARDNERELLA VAGINALIS: NEGATIVE
TRICHOMONAS VAG: NEGATIVE

## 2017-03-27 LAB — VITAMIN D 25 HYDROXY (VIT D DEFICIENCY, FRACTURES): Vit D, 25-Hydroxy: 28.2 ng/mL — ABNORMAL LOW (ref 30.0–100.0)

## 2017-03-27 LAB — HEMOGLOBIN A1C
Est. average glucose Bld gHb Est-mCnc: 114 mg/dL
Hgb A1c MFr Bld: 5.6 % (ref 4.8–5.6)

## 2017-03-27 LAB — HIV ANTIBODY (ROUTINE TESTING W REFLEX): HIV Screen 4th Generation wRfx: NONREACTIVE

## 2017-03-28 LAB — CYTOLOGY - PAP
Chlamydia: NEGATIVE
DIAGNOSIS: NEGATIVE
HPV: NOT DETECTED
NEISSERIA GONORRHEA: NEGATIVE

## 2017-04-03 ENCOUNTER — Encounter: Payer: Self-pay | Admitting: Nurse Practitioner

## 2017-04-03 ENCOUNTER — Ambulatory Visit (INDEPENDENT_AMBULATORY_CARE_PROVIDER_SITE_OTHER): Payer: BLUE CROSS/BLUE SHIELD | Admitting: Nurse Practitioner

## 2017-04-03 VITALS — BP 120/80 | Temp 98.1°F | Wt 266.1 lb

## 2017-04-03 DIAGNOSIS — F32A Depression, unspecified: Secondary | ICD-10-CM

## 2017-04-03 DIAGNOSIS — F419 Anxiety disorder, unspecified: Secondary | ICD-10-CM

## 2017-04-03 DIAGNOSIS — F329 Major depressive disorder, single episode, unspecified: Secondary | ICD-10-CM

## 2017-04-03 MED ORDER — CITALOPRAM HYDROBROMIDE 20 MG PO TABS
ORAL_TABLET | ORAL | 0 refills | Status: DC
Start: 2017-04-03 — End: 2017-07-15

## 2017-04-04 ENCOUNTER — Encounter: Payer: Self-pay | Admitting: Nurse Practitioner

## 2017-04-04 DIAGNOSIS — F419 Anxiety disorder, unspecified: Principal | ICD-10-CM

## 2017-04-04 DIAGNOSIS — F329 Major depressive disorder, single episode, unspecified: Secondary | ICD-10-CM | POA: Insufficient documentation

## 2017-04-04 DIAGNOSIS — F32A Depression, unspecified: Secondary | ICD-10-CM | POA: Insufficient documentation

## 2017-04-04 NOTE — Progress Notes (Signed)
Subjective:  Presents for c/o depression. Has had problems in the past. Was stable for several years. Progressively worse lately. Her [place] of the [place]. Her job was doing well until lately. Frequent crying and fatigue. Has missed days at work due to crying and being upset.  Depression screen [facility] 2/9 04/03/2017 10/01/2016  Decreased Interest - 2  Down, Depressed, Hopeless 3 2  PHQ - 2 Score 3 4  Altered sleeping 3 3  Tired, decreased energy 3 3  Change in appetite 3 2  Feeling bad or failure about yourself  3 2  Trouble concentrating 3 3  Moving slowly or fidgety/restless 3 2  Suicidal thoughts 0 0  PHQ-9 Score 21 19  Difficult doing work/chores - Somewhat difficult   GAD 7 : Generalized Anxiety Score 04/03/2017  Nervous, Anxious, on Edge 3  Control/stop worrying 3  Worry too much - different things 3  Trouble relaxing 3  Restless 1  Easily annoyed or irritable 3  Afraid - awful might happen 3  Total GAD 7 Score 19  Anxiety Difficulty Extremely difficult        Objective:   BP 120/80   Temp 98.1 F (36.7 C) (Oral)   Wt 266 lb 2 oz (120.7 kg)   LMP 01/16/2014   BMI 42.95 kg/m  NAD. Alert, oriented. Lungs clear. Heart RRR. Thoughts logical, coherent and relevant. Crying at times. Dressed appropriately. Making good eye contact.   Assessment:   Problem List Items Addressed This Visit      Other   Anxiety and depression - Primary   Relevant Medications   citalopram (CELEXA) 20 MG tablet        Plan:   Meds ordered this encounter  Medications  . citalopram (CELEXA) 20 MG tablet    Sig: 1/2 tab po qhs x 6 d then one po qhs    Dispense:  30 tablet    Refill:  0    Order Specific Question:   Supervising Provider    Answer:   Mikey Kirschner [[id number]]   Discussed potential adverse effects. DC med and call if any problems. Discussed importance of stress reduction and regular activity.  Return in about 1 month (around 05/04/2017) for  recheck. Call back sooner if needed.

## 2017-04-09 ENCOUNTER — Ambulatory Visit (INDEPENDENT_AMBULATORY_CARE_PROVIDER_SITE_OTHER): Payer: BLUE CROSS/BLUE SHIELD | Admitting: Obstetrics and Gynecology

## 2017-04-09 ENCOUNTER — Encounter: Payer: Self-pay | Admitting: Obstetrics and Gynecology

## 2017-04-09 ENCOUNTER — Ambulatory Visit (INDEPENDENT_AMBULATORY_CARE_PROVIDER_SITE_OTHER): Payer: BLUE CROSS/BLUE SHIELD

## 2017-04-09 DIAGNOSIS — N950 Postmenopausal bleeding: Secondary | ICD-10-CM | POA: Diagnosis not present

## 2017-04-09 NOTE — Progress Notes (Signed)
GYNECOLOGY  VISIT   HPI: 56 y.o.   Divorced  Caucasian  female   G7P1011 with Patient's last menstrual period was 01/16/2014.   here for follow up PMB. She had some spotting last month.   2 years ago the patient had an ultrasound and endometrial biopsy following a progesterone challenge. She had a slightly thickened stripe and proliferative endometrium on biopsy. She was felt to be perimenopausal.   GYNECOLOGIC HISTORY: Patient's last menstrual period was 01/16/2014. Contraception:postmenopause  Menopausal hormone therapy: none         OB History    Gravida Para Term Preterm AB Living   2 1 1  0 1 1   SAB TAB Ectopic Multiple Live Births   1 0 0 0 1         Patient Active Problem List   Diagnosis Date Noted  . Anxiety and depression 04/04/2017  . Chronic back pain 11/15/2013  . Depression 11/15/2013  . Rectal bleeding 09/01/2013  . Benign paroxysmal positional vertigo 12/25/2012  . HEMORRHOIDS, INTERNAL 08/22/2009  . GERD 08/22/2009  . DYSPHAGIA UNSPECIFIED 08/22/2009  . EDEMA 12/03/2008  . DYSPNEA 12/03/2008    Past Medical History:  Diagnosis Date  . Anxiety   . Benign positional vertigo 12/19/13  . Depression   . Insomnia   . Migraine   . Plantar fasciitis   . Reflux     Past Surgical History:  Procedure Laterality Date  . None      Current Outpatient Prescriptions  Medication Sig Dispense Refill  . cholecalciferol (VITAMIN D) 1000 UNITS tablet Take 1,000 Units by mouth daily.    . citalopram (CELEXA) 20 MG tablet 1/2 tab po qhs x 6 d then one po qhs 30 tablet 0   No current facility-administered medications for this visit.      ALLERGIES: Hydrocodone  Family History  Problem Relation Age of Onset  . Colon cancer Father        diagnosed in late 23s.   . Hypertension Father   . Heart attack Father     Social History   Social History  . Marital status: Divorced    Spouse name: N/A  . Number of children: N/A  . Years of education: N/A    Occupational History  . [employer]   Social History Main Topics  . Smoking status: Never Smoker  . Smokeless tobacco: Never Used  . Alcohol use 0.0 oz/week     Comment: 1 a month  . Drug use: No  . Sexual activity: Yes    Partners: Male    Birth control/ protection: Post-menopausal   Other Topics Concern  . Not on file   Social History Narrative  . No narrative on file    Review of Systems  Constitutional: Negative.   HENT: Negative.   Eyes: Negative.   Respiratory: Negative.   Cardiovascular: Negative.   Gastrointestinal: Negative.   Genitourinary: Negative.   Musculoskeletal: Negative.   Skin: Negative.   Neurological: Negative.   Endo/Heme/Allergies: Negative.   Psychiatric/Behavioral: Negative.     PHYSICAL EXAMINATION:    BP 102/60 (BP Location: Right Arm, Patient Position: Sitting, Cuff Size: Normal)   Pulse 80   Resp 16   Wt 266 lb (120.7 kg)   LMP 01/16/2014   BMI 42.93 kg/m     General appearance: alert, cooperative and appears stated age  Ultrasound images were reviewed with the patient  Diagrams were drawn to help explain the sonohysterogram and possible  endometrial polyp vs uniformly thickened endometrium.   ASSESSMENT Postmenopausal spotting, thickened endometrium on ultrasound, can't r/o polyp    PLAN Discussed the options of doing an endometrial biopsy today vs having her come back for a sonohysterogram and possible biopsy depending on the results We discussed that if she has a polyp she will need a hysteroscopy D&C She would like to come back for the sonohysterogram. Will schedule.    An After Visit Summary was printed and given to the patient.  ~15 minutes face to face time of which over 50% was spent in counseling.

## 2017-04-18 ENCOUNTER — Other Ambulatory Visit: Payer: BLUE CROSS/BLUE SHIELD | Admitting: Obstetrics and Gynecology

## 2017-04-18 ENCOUNTER — Ambulatory Visit (INDEPENDENT_AMBULATORY_CARE_PROVIDER_SITE_OTHER): Payer: BLUE CROSS/BLUE SHIELD

## 2017-04-18 ENCOUNTER — Encounter: Payer: Self-pay | Admitting: Obstetrics and Gynecology

## 2017-04-18 ENCOUNTER — Ambulatory Visit (INDEPENDENT_AMBULATORY_CARE_PROVIDER_SITE_OTHER): Payer: BLUE CROSS/BLUE SHIELD | Admitting: Obstetrics and Gynecology

## 2017-04-18 ENCOUNTER — Other Ambulatory Visit: Payer: BLUE CROSS/BLUE SHIELD

## 2017-04-18 VITALS — BP 124/82 | HR 88 | Resp 16 | Wt 266.0 lb

## 2017-04-18 DIAGNOSIS — N840 Polyp of corpus uteri: Secondary | ICD-10-CM

## 2017-04-18 DIAGNOSIS — N950 Postmenopausal bleeding: Secondary | ICD-10-CM

## 2017-04-18 NOTE — Progress Notes (Signed)
GYNECOLOGY  VISIT   HPI: 56 y.o.   Divorced  Caucasian  female   [id number] with Patient's last menstrual period was 01/16/2014.   here for follow up PMB, prior ultrasound with fibroids and a thickened endometrium.    GYNECOLOGIC HISTORY: Patient's last menstrual period was 01/16/2014. Contraception:postmenopause  Menopausal hormone therapy: none         OB History    Gravida Para Term Preterm AB Living   2 1 1  0 1 1   SAB TAB Ectopic Multiple Live Births   1 0 0 0 1         Patient Active Problem List   Diagnosis Date Noted  . Anxiety and depression 04/04/2017  . Chronic back pain 11/15/2013  . Depression 11/15/2013  . Rectal bleeding 09/01/2013  . Benign paroxysmal positional vertigo 12/25/2012  . HEMORRHOIDS, INTERNAL 08/22/2009  . GERD 08/22/2009  . DYSPHAGIA UNSPECIFIED 08/22/2009  . EDEMA 12/03/2008  . DYSPNEA 12/03/2008    Past Medical History:  Diagnosis Date  . Anxiety   . Benign positional vertigo 12/19/13  . Depression   . Insomnia   . Migraine   . Plantar fasciitis   . Reflux     Past Surgical History:  Procedure Laterality Date  . None      Current Outpatient Prescriptions  Medication Sig Dispense Refill  . cholecalciferol (VITAMIN D) 1000 UNITS tablet Take 1,000 Units by mouth daily.    . citalopram (CELEXA) 20 MG tablet 1/2 tab po qhs x 6 d then one po qhs 30 tablet 0   No current facility-administered medications for this visit.      ALLERGIES: Hydrocodone  Family History  Problem Relation Age of Onset  . Colon cancer Father        diagnosed in late 27s.   . Hypertension Father   . Heart attack Father     Social History   Social History  . Marital status: Divorced    Spouse name: N/A  . Number of children: N/A  . Years of education: N/A   Occupational History  . [employer]   Social History Main Topics  . Smoking status: Never Smoker  . Smokeless tobacco: Never Used  . Alcohol use 0.0 oz/week      Comment: 1 a month  . Drug use: No  . Sexual activity: Yes    Partners: Male    Birth control/ protection: Post-menopausal   Other Topics Concern  . Not on file   Social History Narrative  . No narrative on file    Review of Systems  Constitutional: Negative.   HENT: Negative.   Eyes: Negative.   Respiratory: Negative.   Cardiovascular: Negative.   Gastrointestinal: Negative.   Genitourinary: Negative.   Musculoskeletal: Negative.   Skin: Negative.   Neurological: Negative.   Endo/Heme/Allergies: Negative.   Psychiatric/Behavioral: Negative.     PHYSICAL EXAMINATION:    BP 124/82 (BP Location: Right Arm, Patient Position: Sitting, Cuff Size: Large)   Pulse 88   Resp 16   Wt 266 lb (120.7 kg)   LMP 01/16/2014   BMI 42.93 kg/m     General appearance: alert, cooperative and appears stated age  Pelvic: External genitalia:  no lesions              Urethra:  normal appearing urethra with no masses, tenderness or lesions              Bartholins and Skenes: normal  Vagina: normal appearing vagina with normal color and discharge, no lesions              Cervix:no lesions  Sonohysterogram The procedure and risks of the procedure were reviewed with the patient, consent form was signed. A speculum was placed in the vagina and the cervix was cleansed with betadine. The sonohysterogram catheter was inserted into the uterine cavity without difficulty. Saline was infused under direct observation with the ultrasound. A large endometrial polyp was seen, the lining otherwise looked thin. No cervical abnormalities.The catheter was removed.    Chaperone was present for exam.  Ultrasound images reviewed with the patient  ASSESSMENT Postmenopausal bleeding, large endometrial polyp on sonohysterogram    PLAN Plan: hysteroscopy, polypectomy, dilation and curettage. Reviewed risks, including: bleeding, infection, uterine perforation, fluid overload, need for  further sugery    An After Visit Summary was printed and given to the patient.  ~15 minutes face to face time of which over 50% was spent in counseling.

## 2017-04-23 ENCOUNTER — Telehealth: Payer: Self-pay | Admitting: Obstetrics and Gynecology

## 2017-04-23 NOTE — Telephone Encounter (Signed)
Call placed to patient to review benefits for a recommended surgery. Left voicemail message requesting a return call. °

## 2017-04-30 NOTE — Telephone Encounter (Signed)
Spoke with patient regarding benefit for recommended surgery. Patient understood and agreeable. Patient aware this is professional benefit only. Patient aware will be contacted by hospital for separate benefits. Patient advises she will call back to confirm and proceed with scheduling.   cc: Lamont Snowball, RN

## 2017-05-07 NOTE — Telephone Encounter (Signed)
Call placed to patient to follow up regarding scheduling recommended surgery. Left voicemail message requesting a call.

## 2017-05-07 NOTE — Telephone Encounter (Signed)
Patient returned call requesting to speak with [facility].

## 2017-05-17 ENCOUNTER — Encounter: Payer: Self-pay | Admitting: Nurse Practitioner

## 2017-05-17 ENCOUNTER — Ambulatory Visit: Payer: BLUE CROSS/BLUE SHIELD | Admitting: Nurse Practitioner

## 2017-05-17 VITALS — BP 128/84 | Ht 66.0 in | Wt 268.8 lb

## 2017-05-17 DIAGNOSIS — F329 Major depressive disorder, single episode, unspecified: Secondary | ICD-10-CM

## 2017-05-17 DIAGNOSIS — F32A Depression, unspecified: Secondary | ICD-10-CM

## 2017-05-17 DIAGNOSIS — F419 Anxiety disorder, unspecified: Secondary | ICD-10-CM | POA: Diagnosis not present

## 2017-05-17 MED ORDER — PHENTERMINE HCL 37.5 MG PO TABS: 37.5000 mg | ORAL_TABLET | Freq: Every day | ORAL | 0 refills | Status: DC

## 2017-05-18 ENCOUNTER — Encounter: Payer: Self-pay | Admitting: Nurse Practitioner

## 2017-05-18 DIAGNOSIS — E669 Obesity, unspecified: Secondary | ICD-10-CM | POA: Insufficient documentation

## 2017-05-18 NOTE — Progress Notes (Signed)
Subjective:  Presents for recheck on her anxiety and depression. Doing much better on Celexa. Continues to have increased stress with her job but handling it better. Less emotional lability. Denies any adverse effects with Celexa. Would like to start weight loss medication to jump start her weight loss.   Objective:   BP 128/84   Ht 5\' 6"  (1.676 m)   Wt 268 lb 12.8 oz (121.9 kg)   LMP 01/16/2014   BMI 43.39 kg/m  NAD. Alert, oriented. Thoughts logical, coherent and relevant. Dressed appropriately. Making good eye contact. Cheerful affect, no crying. Lungs clear. Heart RRR. Significant central obesity.   Assessment:   Problem List Items Addressed This Visit      Other   Anxiety and depression - Primary   Morbid obesity ([place])   Relevant Medications   phentermine (ADIPEX-P) 37.5 MG tablet       Plan:   Meds ordered this encounter  Medications  . phentermine (ADIPEX-P) 37.5 MG tablet    Sig: Take 1 tablet (37.5 mg total) by mouth daily before breakfast.    Dispense:  30 tablet    Refill:  0    Order Specific Question:   Supervising Provider    Answer:   Mikey Kirschner [[id number]]   Continue Celexa at same dose. Discussed importance of life style factors in weight loss. Encouraged healthy diet and regular activity. Trial of Phentermine. Reviewed potential adverse effects. DC med and contact office if any problems. Otherwise, recheck in one month.

## 2017-06-05 ENCOUNTER — Telehealth: Payer: Self-pay | Admitting: Obstetrics and Gynecology

## 2017-06-05 NOTE — Telephone Encounter (Signed)
Spoke with patient again in regards to benefits for recommended surgery. Patient understood benefit information presented. Patient states, she will have new insurance coverage effective January 2019 and advises once she receives her new insurance card, she will provide information for pre-certification purposes, but adds she would like to look at surgery dates toward the end of January 2019.   Routing to Nurse Supervisor, Lamont Snowball, RN

## 2017-06-05 NOTE — Telephone Encounter (Signed)
See new phone encounter dated 06/05/17

## 2017-06-14 ENCOUNTER — Ambulatory Visit: Payer: BLUE CROSS/BLUE SHIELD | Admitting: Nurse Practitioner

## 2017-06-14 ENCOUNTER — Encounter: Payer: Self-pay | Admitting: Nurse Practitioner

## 2017-06-14 VITALS — BP 124/80 | Ht 66.0 in | Wt 265.0 lb

## 2017-06-14 DIAGNOSIS — F419 Anxiety disorder, unspecified: Secondary | ICD-10-CM

## 2017-06-14 DIAGNOSIS — F329 Major depressive disorder, single episode, unspecified: Secondary | ICD-10-CM | POA: Diagnosis not present

## 2017-06-14 DIAGNOSIS — F32A Depression, unspecified: Secondary | ICD-10-CM

## 2017-06-14 MED ORDER — CLONAZEPAM 0.5 MG PO TABS: ORAL_TABLET | ORAL | 0 refills | Status: DC

## 2017-06-14 NOTE — Patient Instructions (Addendum)
Nasacort AQ as directed Take Klonopin OR Meclizine for your flight; take at least 4 hours apart

## 2017-06-15 ENCOUNTER — Encounter: Payer: Self-pay | Admitting: Nurse Practitioner

## 2017-06-15 NOTE — Progress Notes (Signed)
Subjective:  Presents for recheck on her anxiety and depression. Did not start Phentermine. Continues to have significant issues. She was keeping a dog for her daughter for several months which she says was more like a therapy dog which provided companionship. Recently the dog was taken away by her daughter and she has been very upset since. Has a trip planned to [place] with her daughter on 1/2 but is so upset she does not even want to go. Also has anxiety about the trip. Denies suicidal or homicidal thoughts or ideation. Has been given a "nerve pill" for anxiety by a colleague at work on at least one occasion which helped. Worried about dizziness and ear pressure during upcoming flight.   Objective:   BP 124/80   Ht 5\' 6"  (1.676 m)   Wt 265 lb (120.2 kg)   LMP 01/16/2014   BMI 42.77 kg/m  NAD. Alert, oriented. TMs retracted bilat; no erythema. Lungs clear. Heart RRR. Thoughts logical, coherent and relevant. Dressed appropriately. Crying during most of the visit. Making good eye contact.   Assessment:   Problem List Items Addressed This Visit      Other   Anxiety and depression - Primary       Plan:   Meds ordered this encounter  Medications  . clonazePAM (KLONOPIN) 0.5 MG tablet    Sig: Take 1/2 - 1 po BID prn anxiety    Dispense:  30 tablet    Refill:  0    Order Specific Question:   Supervising Provider    Answer:   Mikey Kirschner [[id number]]   Use klonopin sparingly. Drowsiness precautions.  Nasacort AQ as directed Take Klonopin OR Meclizine for your flight; take at least 4 hours apart Due to the level of anxiety and depression and lack of significant improvement, strongly recommend counseling. Patient agrees so will set up referral. To seek help immediately if worse. Return in about 3 months (around 09/12/2017) for recheck. Call back sooner if needed.

## 2017-07-09 NOTE — Telephone Encounter (Signed)
Call to patient for update on plans for surgery. Patient states she has been meaning to call but has been "procrastinating."  Received new insurance card and states all ID numbers are the same. Surgery date of 07-23-17 discussed and patient agreeable. Surgery consult scheduled for 07-12-17 and surgery instruction sheet reviewed. Will provide printed copy in surgery packet with hospital brochure on Friday.  Routing to provider for final review. Patient agreeable to disposition. Will close encounter.

## 2017-07-09 NOTE — Telephone Encounter (Signed)
Call to patient. Advised surgery time changed to 1045 on 07-23-17. Patient agreeable.

## 2017-07-10 NOTE — Progress Notes (Signed)
GYNECOLOGY  VISIT   HPI: 57 y.o.   Divorced  Caucasian  female   [id number] with Patient's last menstrual period was 01/16/2014.   here for surgical consult.  The patient presented with postmenopausal bleeding. Ultrasound with a fibroid uterus and a thickened endometrium, sonohysterogram with a large endometrial polyp and an otherwise thin endometrium  GYNECOLOGIC HISTORY: Patient's last menstrual period was 01/16/2014. Contraception:postmenopausal Menopausal hormone therapy: none        OB History    Gravida Para Term Preterm AB Living   2 1 1  0 1 1   SAB TAB Ectopic Multiple Live Births   1 0 0 0 1         Patient Active Problem List   Diagnosis Date Noted  . Morbid obesity ([place]) 05/18/2017  . Anxiety and depression 04/04/2017  . Depression 11/15/2013  . Rectal bleeding 09/01/2013  . Benign paroxysmal positional vertigo 12/25/2012  . HEMORRHOIDS, INTERNAL 08/22/2009  . GERD 08/22/2009  . DYSPHAGIA UNSPECIFIED 08/22/2009  . EDEMA 12/03/2008  . DYSPNEA 12/03/2008    Past Medical History:  Diagnosis Date  . Anxiety   . Benign positional vertigo 12/19/13  . Depression   . Insomnia   . Migraine   . Plantar fasciitis   . Reflux     Past Surgical History:  Procedure Laterality Date  . None      Current Outpatient Medications  Medication Sig Dispense Refill  . cholecalciferol (VITAMIN D) 1000 UNITS tablet Take 1,000 Units by mouth daily.    . citalopram (CELEXA) 20 MG tablet 1/2 tab po qhs x 6 d then one po qhs (Patient not taking: Reported on 07/12/2017) 30 tablet 0  . clonazePAM (KLONOPIN) 0.5 MG tablet Take 1/2 - 1 po BID prn anxiety (Patient not taking: Reported on 07/12/2017) 30 tablet 0  . phentermine (ADIPEX-P) 37.5 MG tablet Take 1 tablet (37.5 mg total) by mouth daily before breakfast. (Patient not taking: Reported on 06/14/2017) 30 tablet 0   No current facility-administered medications for this visit.      ALLERGIES: Hydrocodone only nausea  Family History   Problem Relation Age of Onset  . Colon cancer Father        diagnosed in late 73s.   . Hypertension Father   . Heart attack Father     Social History   Socioeconomic History  . Marital status: Divorced    Spouse name: Not on file  . Number of children: Not on file  . Years of education: Not on file  . Highest education level: Not on file  Social Needs  . Financial resource strain: Not on file  . Food insecurity - worry: Not on file  . Food insecurity - inability: Not on file  . Transportation needs - medical: Not on file  . Transportation needs - non-medical: Not on file  Occupational History  . Occupation: Counsellor: Dance movement psychotherapist  Tobacco Use  . Smoking status: Never Smoker  . Smokeless tobacco: Never Used  Substance and Sexual Activity  . Alcohol use: Yes    Alcohol/week: 0.0 oz    Comment: 1 a month  . Drug use: No  . Sexual activity: Yes    Partners: Male    Birth control/protection: Post-menopausal  Other Topics Concern  . Not on file  Social History Narrative  . Not on file    Review of Systems  Constitutional: Negative.   HENT:       Sinutitis  Eyes: Negative.   Cardiovascular: Negative.   Gastrointestinal: Positive for constipation and diarrhea.  Genitourinary: Negative.   Musculoskeletal: Negative.   Skin: Negative.        Breast pain, palpitations, excessive thirst,  Neurological: Negative.   Endo/Heme/Allergies: Negative.   Psychiatric/Behavioral: Positive for depression.       Anxiety    PHYSICAL EXAMINATION:    BP 124/76 (BP Location: Right Arm, Patient Position: Sitting, Cuff Size: Large)   Pulse 88   Ht 5\' 6"  (1.676 m)   Wt 266 lb 3.2 oz (120.7 kg)   LMP 01/16/2014   BMI 42.97 kg/m     General appearance: alert, cooperative and appears stated age Neck: no adenopathy, supple, symmetrical, trachea midline and thyroid normal to inspection and palpation Heart: regular rate and rhythm Lungs:  CTAB Abdomen: soft, non-tender; bowel sounds normal; no masses,  no organomegaly Extremities: normal, atraumatic, no cyanosis Skin: normal color, texture and turgor, no rashes or lesions Lymph: normal cervical supraclavicular and inguinal nodes Neurologic: grossly normal    ASSESSMENT Postmenopausal bleeding, endometrial polyp    PLAN Plan: hysteroscopy, polypectomy, dilation and curettage. Reviewed risks, including: bleeding, infection, uterine perforation, fluid overload, need for further sugery    An After Visit Summary was printed and given to the patient.

## 2017-07-12 ENCOUNTER — Other Ambulatory Visit: Payer: Self-pay

## 2017-07-12 ENCOUNTER — Ambulatory Visit (INDEPENDENT_AMBULATORY_CARE_PROVIDER_SITE_OTHER): Payer: BLUE CROSS/BLUE SHIELD | Admitting: Obstetrics and Gynecology

## 2017-07-12 ENCOUNTER — Encounter: Payer: Self-pay | Admitting: Obstetrics and Gynecology

## 2017-07-12 VITALS — BP 124/76 | HR 88 | Ht 66.0 in | Wt 266.2 lb

## 2017-07-12 DIAGNOSIS — N950 Postmenopausal bleeding: Secondary | ICD-10-CM

## 2017-07-12 DIAGNOSIS — N840 Polyp of corpus uteri: Secondary | ICD-10-CM

## 2017-07-12 NOTE — H&P (Signed)
GYNECOLOGY  VISIT   HPI: 57 y.o.   Divorced  Caucasian  female   [id number] with Patient's last menstrual period was 01/16/2014.   here for surgical consult.  The patient presented with postmenopausal bleeding. Ultrasound with a fibroid uterus and a thickened endometrium, sonohysterogram with a large endometrial polyp and an otherwise thin endometrium  GYNECOLOGIC HISTORY: Patient's last menstrual period was 01/16/2014. Contraception:postmenopausal Menopausal hormone therapy: none        OB History    Gravida Para Term Preterm AB Living   2 1 1  0 1 1   SAB TAB Ectopic Multiple Live Births   1 0 0 0 1         Patient Active Problem List   Diagnosis Date Noted  . Morbid obesity ([place]) 05/18/2017  . Anxiety and depression 04/04/2017  . Depression 11/15/2013  . Rectal bleeding 09/01/2013  . Benign paroxysmal positional vertigo 12/25/2012  . HEMORRHOIDS, INTERNAL 08/22/2009  . GERD 08/22/2009  . DYSPHAGIA UNSPECIFIED 08/22/2009  . EDEMA 12/03/2008  . DYSPNEA 12/03/2008    Past Medical History:  Diagnosis Date  . Anxiety   . Benign positional vertigo 12/19/13  . Depression   . Insomnia   . Migraine   . Plantar fasciitis   . Reflux     Past Surgical History:  Procedure Laterality Date  . None      Current Outpatient Medications  Medication Sig Dispense Refill  . cholecalciferol (VITAMIN D) 1000 UNITS tablet Take 1,000 Units by mouth daily.    . citalopram (CELEXA) 20 MG tablet 1/2 tab po qhs x 6 d then one po qhs (Patient not taking: Reported on 07/12/2017) 30 tablet 0  . clonazePAM (KLONOPIN) 0.5 MG tablet Take 1/2 - 1 po BID prn anxiety (Patient not taking: Reported on 07/12/2017) 30 tablet 0  . phentermine (ADIPEX-P) 37.5 MG tablet Take 1 tablet (37.5 mg total) by mouth daily before breakfast. (Patient not taking: Reported on 06/14/2017) 30 tablet 0   No current facility-administered medications for this visit.      ALLERGIES: Hydrocodone only nausea  Family  History  Problem Relation Age of Onset  . Colon cancer Father        diagnosed in late 3s.   . Hypertension Father   . Heart attack Father     Social History   Socioeconomic History  . Marital status: Divorced    Spouse name: Not on file  . Number of children: Not on file  . Years of education: Not on file  . Highest education level: Not on file  Social Needs  . Financial resource strain: Not on file  . Food insecurity - worry: Not on file  . Food insecurity - inability: Not on file  . Transportation needs - medical: Not on file  . Transportation needs - non-medical: Not on file  Occupational History  . Occupation: Counsellor: Dance movement psychotherapist  Tobacco Use  . Smoking status: Never Smoker  . Smokeless tobacco: Never Used  Substance and Sexual Activity  . Alcohol use: Yes    Alcohol/week: 0.0 oz    Comment: 1 a month  . Drug use: No  . Sexual activity: Yes    Partners: Male    Birth control/protection: Post-menopausal  Other Topics Concern  . Not on file  Social History Narrative  . Not on file    Review of Systems  Constitutional: Negative.   HENT:       Sinutitis  Eyes: Negative.   Cardiovascular: Negative.   Gastrointestinal: Positive for constipation and diarrhea.  Genitourinary: Negative.   Musculoskeletal: Negative.   Skin: Negative.        Breast pain, palpitations, excessive thirst,  Neurological: Negative.   Endo/Heme/Allergies: Negative.   Psychiatric/Behavioral: Positive for depression.       Anxiety    PHYSICAL EXAMINATION:    BP 124/76 (BP Location: Right Arm, Patient Position: Sitting, Cuff Size: Large)   Pulse 88   Ht 5\' 6"  (1.676 m)   Wt 266 lb 3.2 oz (120.7 kg)   LMP 01/16/2014   BMI 42.97 kg/m     General appearance: alert, cooperative and appears stated age Neck: no adenopathy, supple, symmetrical, trachea midline and thyroid normal to inspection and palpation Heart: regular rate and rhythm Lungs:  CTAB Abdomen: soft, non-tender; bowel sounds normal; no masses,  no organomegaly Extremities: normal, atraumatic, no cyanosis Skin: normal color, texture and turgor, no rashes or lesions Lymph: normal cervical supraclavicular and inguinal nodes Neurologic: grossly normal    ASSESSMENT Postmenopausal bleeding, endometrial polyp    PLAN Plan: hysteroscopy, polypectomy, dilation and curettage. Reviewed risks, including: bleeding, infection, uterine perforation, fluid overload, need for further sugery    An After Visit Summary was printed and given to the patient.

## 2017-07-15 ENCOUNTER — Other Ambulatory Visit: Payer: Self-pay

## 2017-07-15 ENCOUNTER — Encounter (HOSPITAL_BASED_OUTPATIENT_CLINIC_OR_DEPARTMENT_OTHER): Payer: Self-pay

## 2017-07-15 NOTE — Progress Notes (Signed)
Pre op order are in epic.

## 2017-07-15 NOTE — Progress Notes (Signed)
Spoke with:  Kareemah NPO:  After Midnight, no gum, candy, or mints   Arrival time: 8:45AM Labs: Hemoglobin AM medications: Clonazepam if needed Pre op orders: No Ride home: Gwenlyn Fudge (friend) [phone]

## 2017-07-16 ENCOUNTER — Telehealth: Payer: Self-pay | Admitting: Obstetrics and Gynecology

## 2017-07-16 NOTE — Telephone Encounter (Signed)
Patient has a procedure 07/23/17 and is asking if it is okay for her take her anxiety medication before per procedure at [facility]?

## 2017-07-16 NOTE — Telephone Encounter (Signed)
Patient is scheduled for a D&C on 07/23/2017. Asking if she can take Klonopin before her surgery. Advised she can take this up to midnight the night before surgery. Should not take this after or before surgery due to anesthesia and possibly needed to provide consent. Patient verbalizes understanding.  Routing to provider for final review. Patient agreeable to disposition. Will close encounter.

## 2017-07-18 ENCOUNTER — Telehealth: Payer: Self-pay

## 2017-07-18 NOTE — Telephone Encounter (Signed)
Spoke with patient and notified surgery time on 07-23-17 had been changed from 9:00am to 12:15pm at [facility]. Patient will receive call from hospital letting her know of arrival time but it will probably be around 10:15am.

## 2017-07-22 ENCOUNTER — Telehealth: Payer: Self-pay | Admitting: Obstetrics and Gynecology

## 2017-07-22 NOTE — Telephone Encounter (Signed)
Spoke with patient. Advised reviewed with Dr.Jertson who states that the patient can take tylenol or antihistamines if needed. Will be assessed tomorrow by anesthesia and they would make the determination on proceeding if she were to worsen. Patient is agreeable.  Routing to provider for final review. Patient agreeable to disposition. Will close encounter.

## 2017-07-22 NOTE — Telephone Encounter (Signed)
Spoke with patient. Patient states she has had a sore throat all day and is feeling congestion and some pain in her ears. Is at work right now and reports no other symptoms. Patient is worried about worsening symptoms with surgery tomorrow. Has not taken any medication. Asking if there is anything she can or should do before surgery. Advised will review with MD and return call.

## 2017-07-22 NOTE — Telephone Encounter (Addendum)
Patient called and left a message during lunch. She said, "I have surgery scheduled with Dr. Talbert Nan tomorrow and I'm calling to report I have a sore throat and my ears are hurting a bit. I'm at work today but wanted to report these symptoms."    Routing to triage per Gay Filler.

## 2017-07-23 ENCOUNTER — Encounter (HOSPITAL_BASED_OUTPATIENT_CLINIC_OR_DEPARTMENT_OTHER)
Admission: RE | Disposition: A | Discharge status: Home / Self Care | Payer: Self-pay | Source: Ambulatory Visit | Attending: Obstetrics and Gynecology

## 2017-07-23 ENCOUNTER — Ambulatory Visit (HOSPITAL_BASED_OUTPATIENT_CLINIC_OR_DEPARTMENT_OTHER)
Admission: RE | Admit: 2017-07-23 | Discharge: 2017-07-23 | Disposition: A | Discharge status: Home / Self Care | Payer: BLUE CROSS/BLUE SHIELD | Source: Ambulatory Visit | Attending: Obstetrics and Gynecology | Admitting: Obstetrics and Gynecology

## 2017-07-23 ENCOUNTER — Ambulatory Visit (HOSPITAL_BASED_OUTPATIENT_CLINIC_OR_DEPARTMENT_OTHER): Payer: BLUE CROSS/BLUE SHIELD | Admitting: Anesthesiology

## 2017-07-23 ENCOUNTER — Telehealth: Payer: Self-pay | Admitting: Obstetrics and Gynecology

## 2017-07-23 ENCOUNTER — Encounter (HOSPITAL_BASED_OUTPATIENT_CLINIC_OR_DEPARTMENT_OTHER): Payer: Self-pay | Admitting: *Deleted

## 2017-07-23 DIAGNOSIS — D259 Leiomyoma of uterus, unspecified: Secondary | ICD-10-CM | POA: Insufficient documentation

## 2017-07-23 DIAGNOSIS — R9389 Abnormal findings on diagnostic imaging of other specified body structures: Secondary | ICD-10-CM | POA: Insufficient documentation

## 2017-07-23 DIAGNOSIS — N950 Postmenopausal bleeding: Secondary | ICD-10-CM | POA: Diagnosis not present

## 2017-07-23 DIAGNOSIS — G4700 Insomnia, unspecified: Secondary | ICD-10-CM | POA: Diagnosis not present

## 2017-07-23 DIAGNOSIS — K219 Gastro-esophageal reflux disease without esophagitis: Secondary | ICD-10-CM | POA: Diagnosis not present

## 2017-07-23 DIAGNOSIS — Z885 Allergy status to narcotic agent status: Secondary | ICD-10-CM | POA: Insufficient documentation

## 2017-07-23 DIAGNOSIS — R0600 Dyspnea, unspecified: Secondary | ICD-10-CM | POA: Diagnosis not present

## 2017-07-23 DIAGNOSIS — F419 Anxiety disorder, unspecified: Secondary | ICD-10-CM | POA: Insufficient documentation

## 2017-07-23 DIAGNOSIS — R1310 Dysphagia, unspecified: Secondary | ICD-10-CM | POA: Insufficient documentation

## 2017-07-23 DIAGNOSIS — K625 Hemorrhage of anus and rectum: Secondary | ICD-10-CM | POA: Diagnosis not present

## 2017-07-23 DIAGNOSIS — R51 Headache: Secondary | ICD-10-CM | POA: Insufficient documentation

## 2017-07-23 DIAGNOSIS — N840 Polyp of corpus uteri: Secondary | ICD-10-CM | POA: Insufficient documentation

## 2017-07-23 DIAGNOSIS — Z6841 Body Mass Index (BMI) 40.0 and over, adult: Secondary | ICD-10-CM | POA: Diagnosis not present

## 2017-07-23 DIAGNOSIS — Z79899 Other long term (current) drug therapy: Secondary | ICD-10-CM | POA: Insufficient documentation

## 2017-07-23 DIAGNOSIS — K648 Other hemorrhoids: Secondary | ICD-10-CM | POA: Diagnosis not present

## 2017-07-23 DIAGNOSIS — F329 Major depressive disorder, single episode, unspecified: Secondary | ICD-10-CM | POA: Insufficient documentation

## 2017-07-23 DIAGNOSIS — H8110 Benign paroxysmal vertigo, unspecified ear: Secondary | ICD-10-CM | POA: Insufficient documentation

## 2017-07-23 DIAGNOSIS — N858 Other specified noninflammatory disorders of uterus: Secondary | ICD-10-CM | POA: Diagnosis not present

## 2017-07-23 HISTORY — DX: Anemia, unspecified: D64.9

## 2017-07-23 HISTORY — DX: Postmenopausal bleeding: N95.0

## 2017-07-23 HISTORY — DX: Hemorrhage of anus and rectum: K62.5

## 2017-07-23 HISTORY — DX: Polyp of corpus uteri: N84.0

## 2017-07-23 HISTORY — DX: Gastro-esophageal reflux disease without esophagitis: K21.9

## 2017-07-23 HISTORY — PX: DILATATION & CURETTAGE/HYSTEROSCOPY WITH MYOSURE: SHX6511

## 2017-07-23 HISTORY — DX: Dyspnea, unspecified: R06.00

## 2017-07-23 HISTORY — DX: Residual hemorrhoidal skin tags: K64.4

## 2017-07-23 HISTORY — DX: Other hemorrhoids: K64.8

## 2017-07-23 HISTORY — DX: Morbid (severe) obesity due to excess calories: E66.01

## 2017-07-23 HISTORY — DX: Benign neoplasm of connective and other soft tissue, unspecified: D21.9

## 2017-07-23 HISTORY — DX: Edema, unspecified: R60.9

## 2017-07-23 LAB — POCT HEMOGLOBIN-HEMACUE: Hemoglobin: 14.5 g/dL (ref 12.0–15.0)

## 2017-07-23 SURGERY — DILATATION & CURETTAGE/HYSTEROSCOPY WITH MYOSURE
Anesthesia: General | Site: Vagina

## 2017-07-23 MED ORDER — FENTANYL CITRATE (PF) 100 MCG/2ML IJ SOLN
25.0000 ug | INTRAMUSCULAR | Status: DC | PRN
  Filled 2017-07-23: qty 1

## 2017-07-23 MED ORDER — SUGAMMADEX SODIUM 200 MG/2ML IV SOLN
INTRAVENOUS | Status: DC | PRN
  Administered 2017-07-23: 250 mg via INTRAVENOUS

## 2017-07-23 MED ORDER — KETOROLAC TROMETHAMINE 30 MG/ML IJ SOLN
INTRAMUSCULAR | Status: AC
  Filled 2017-07-23: qty 1

## 2017-07-23 MED ORDER — SOD CITRATE-CITRIC ACID 500-334 MG/5ML PO SOLN
ORAL | Status: AC
  Filled 2017-07-23: qty 30

## 2017-07-23 MED ORDER — ONDANSETRON HCL 4 MG/2ML IJ SOLN
INTRAMUSCULAR | Status: DC | PRN
  Administered 2017-07-23: 4 mg via INTRAVENOUS

## 2017-07-23 MED ORDER — PROPOFOL 10 MG/ML IV BOLUS
INTRAVENOUS | Status: AC
  Filled 2017-07-23: qty 20

## 2017-07-23 MED ORDER — SCOPOLAMINE 1 MG/3DAYS TD PT72
1.0000 | MEDICATED_PATCH | Freq: Once | TRANSDERMAL | Status: DC
  Administered 2017-07-23: 1.5 mg via TRANSDERMAL
  Filled 2017-07-23: qty 1

## 2017-07-23 MED ORDER — FENTANYL CITRATE (PF) 100 MCG/2ML IJ SOLN
INTRAMUSCULAR | Status: AC
  Filled 2017-07-23: qty 2

## 2017-07-23 MED ORDER — LIDOCAINE 2% (20 MG/ML) 5 ML SYRINGE
INTRAMUSCULAR | Status: DC | PRN
  Administered 2017-07-23: 100 mg via INTRAVENOUS

## 2017-07-23 MED ORDER — FENTANYL CITRATE (PF) 100 MCG/2ML IJ SOLN
INTRAMUSCULAR | Status: DC | PRN
  Administered 2017-07-23 (×2): 50 ug via INTRAVENOUS

## 2017-07-23 MED ORDER — KETOROLAC TROMETHAMINE 30 MG/ML IJ SOLN
INTRAMUSCULAR | Status: DC | PRN
  Administered 2017-07-23: 30 mg via INTRAVENOUS

## 2017-07-23 MED ORDER — DEXAMETHASONE SODIUM PHOSPHATE 10 MG/ML IJ SOLN
INTRAMUSCULAR | Status: AC
  Filled 2017-07-23: qty 1

## 2017-07-23 MED ORDER — SOD CITRATE-CITRIC ACID 500-334 MG/5ML PO SOLN
30.0000 mL | ORAL | Status: AC
  Administered 2017-07-23: 30 mL via ORAL
  Filled 2017-07-23: qty 30

## 2017-07-23 MED ORDER — ONDANSETRON HCL 4 MG/2ML IJ SOLN
4.0000 mg | Freq: Once | INTRAMUSCULAR | Status: DC | PRN
  Filled 2017-07-23: qty 2

## 2017-07-23 MED ORDER — DEXAMETHASONE SODIUM PHOSPHATE 10 MG/ML IJ SOLN
INTRAMUSCULAR | Status: DC | PRN
  Administered 2017-07-23: 10 mg via INTRAVENOUS

## 2017-07-23 MED ORDER — ROCURONIUM BROMIDE 10 MG/ML (PF) SYRINGE
PREFILLED_SYRINGE | INTRAVENOUS | Status: DC | PRN
  Administered 2017-07-23: 30 mg via INTRAVENOUS

## 2017-07-23 MED ORDER — SUCCINYLCHOLINE CHLORIDE 200 MG/10ML IV SOSY
PREFILLED_SYRINGE | INTRAVENOUS | Status: AC
  Filled 2017-07-23: qty 10

## 2017-07-23 MED ORDER — LACTATED RINGERS IV SOLN
INTRAVENOUS | Status: DC
  Administered 2017-07-23: 11:00:00 via INTRAVENOUS
  Filled 2017-07-23: qty 1000

## 2017-07-23 MED ORDER — PROPOFOL 500 MG/50ML IV EMUL
INTRAVENOUS | Status: DC | PRN
  Administered 2017-07-23: 25 ug/kg/min via INTRAVENOUS

## 2017-07-23 MED ORDER — SODIUM CHLORIDE 0.9 % IR SOLN
Status: DC | PRN
  Administered 2017-07-23: 3000 mL

## 2017-07-23 MED ORDER — SCOPOLAMINE 1 MG/3DAYS TD PT72
MEDICATED_PATCH | TRANSDERMAL | Status: AC
  Filled 2017-07-23: qty 1

## 2017-07-23 MED ORDER — SUCCINYLCHOLINE CHLORIDE 200 MG/10ML IV SOSY
PREFILLED_SYRINGE | INTRAVENOUS | Status: DC | PRN
  Administered 2017-07-23: 120 mg via INTRAVENOUS

## 2017-07-23 MED ORDER — ACETAMINOPHEN 500 MG PO TABS
ORAL_TABLET | ORAL | Status: AC
  Filled 2017-07-23: qty 2

## 2017-07-23 MED ORDER — SUGAMMADEX SODIUM 200 MG/2ML IV SOLN
INTRAVENOUS | Status: AC
  Filled 2017-07-23: qty 2

## 2017-07-23 MED ORDER — MIDAZOLAM HCL 2 MG/2ML IJ SOLN
INTRAMUSCULAR | Status: DC | PRN
  Administered 2017-07-23: 2 mg via INTRAVENOUS

## 2017-07-23 MED ORDER — ONDANSETRON HCL 4 MG/2ML IJ SOLN
INTRAMUSCULAR | Status: AC
  Filled 2017-07-23: qty 2

## 2017-07-23 MED ORDER — ACETAMINOPHEN 500 MG PO TABS
1000.0000 mg | ORAL_TABLET | Freq: Once | ORAL | Status: AC
  Administered 2017-07-23: 1000 mg via ORAL
  Filled 2017-07-23: qty 2

## 2017-07-23 MED ORDER — MIDAZOLAM HCL 2 MG/2ML IJ SOLN
INTRAMUSCULAR | Status: AC
  Filled 2017-07-23: qty 2

## 2017-07-23 MED ORDER — ROCURONIUM BROMIDE 10 MG/ML (PF) SYRINGE
PREFILLED_SYRINGE | INTRAVENOUS | Status: AC
  Filled 2017-07-23: qty 5

## 2017-07-23 MED ORDER — LIDOCAINE 2% (20 MG/ML) 5 ML SYRINGE
INTRAMUSCULAR | Status: AC
  Filled 2017-07-23: qty 5

## 2017-07-23 MED ORDER — PROPOFOL 10 MG/ML IV BOLUS
INTRAVENOUS | Status: DC | PRN
  Administered 2017-07-23: 160 mg via INTRAVENOUS

## 2017-07-23 MED ORDER — LACTATED RINGERS IV SOLN
INTRAVENOUS | Status: DC
  Filled 2017-07-23: qty 1000

## 2017-07-23 SURGICAL SUPPLY — 23 items
CANISTER SUCT 3000ML PPV (MISCELLANEOUS) ×6 IMPLANT
CATH ROBINSON RED A/P 16FR (CATHETERS) IMPLANT
COUNTER NEEDLE 1200 MAGNETIC (NEEDLE) ×3 IMPLANT
DEVICE MYOSURE LITE (MISCELLANEOUS) IMPLANT
DEVICE MYOSURE REACH (MISCELLANEOUS) ×2 IMPLANT
DILATOR CANAL MILEX (MISCELLANEOUS) IMPLANT
FILTER ARTHROSCOPY CONVERTOR (FILTER) ×3 IMPLANT
GLOVE BIO SURGEON STRL SZ 6.5 (GLOVE) ×2 IMPLANT
GLOVE BIO SURGEONS STRL SZ 6.5 (GLOVE) ×1
GOWN STRL REUS W/TWL LRG LVL3 (GOWN DISPOSABLE) ×3 IMPLANT
IV NS IRRIG 3000ML ARTHROMATIC (IV SOLUTION) ×3 IMPLANT
KIT RM TURNOVER CYSTO AR (KITS) ×3 IMPLANT
MYOSURE XL FIBROID REM (MISCELLANEOUS)
PACK VAGINAL MINOR WOMEN LF (CUSTOM PROCEDURE TRAY) ×3 IMPLANT
PAD OB MATERNITY 4.3X12.25 (PERSONAL CARE ITEMS) ×3 IMPLANT
PAD PREP 24X48 CUFFED NSTRL (MISCELLANEOUS) ×3 IMPLANT
SEAL ROD LENS SCOPE MYOSURE (ABLATOR) ×3 IMPLANT
SYR 20CC LL (SYRINGE) IMPLANT
SYSTEM TISS REMOVAL MYSR XL RM (MISCELLANEOUS) IMPLANT
TOWEL OR 17X24 6PK STRL BLUE (TOWEL DISPOSABLE) ×6 IMPLANT
TUBING AQUILEX INFLOW (TUBING) ×3 IMPLANT
TUBING AQUILEX OUTFLOW (TUBING) ×3 IMPLANT
WATER STERILE IRR 500ML POUR (IV SOLUTION) IMPLANT

## 2017-07-23 NOTE — Op Note (Signed)
Preoperative Diagnosis: Postmenopausal bleeding, endometrial polyp  Postoperative Diagnosis: Same  Procedure: Hysteroscopy, polypectomy, dilation and curettage  Surgeon: Dr Sumner Boast  Assistants: None  Anesthesia: General   EBL: minimal  Fluids: 500 cc  Fluid deficit: 160 cc  Urine output: not recoreded  Indications for surgery: The patient is a 57 yo female, who presented with postmenopausal bleeding. Work up included a sonohysterogram which revealed an endometrial polyp The risks of the surgery were reviewed with the patient and the consent form was signed prior to her surgery.  Findings: Anteverted, mobile uterus, no adnexal masses. Hysteroscopy: 3 endometrial polyps otherwise normal endometrium. Normal tubal ostia bilaterally  Specimens: Endometrial polyps, endometrial curettings   Procedure: The patient was taken to the operating room with an IV in place. She was placed in the dorsal lithotomy position and anesthesia was administered. She was prepped and draped in the usual sterile fashion for a vaginal procedure. She was I&O catheterized. A weighted speculum was placed in the vagina and a single tooth tenaculum was placed on the anterior lip of the cervix. The cervix was dilated to a #7 hagar dilator. The uterus was sounded to 10 cm. The myosure hysteroscope was inserted into the uterine cavity. With continuous infusion of normal saline, the uterine cavity was visualized with the above findings. The myosure reach was used to resect the polyps. The myosure was then removed. The cavity was then curetted with the small sharp curette. The cavity had the characteristically gritty texture at the end of the procedure. The curette and the single tooth tenaculum were removed. Oozing from the tenaculum site was stopped with pressure. The speculum was removed. The patients perineum was cleansed of betadine and she was taken out of the dorsal lithotomy position.  Upon awakening, she was  extubated and transferred to the recovery room in stable and awake condition.  The sponge and instrument count were correct. There were no complications.

## 2017-07-23 NOTE — Anesthesia Preprocedure Evaluation (Addendum)
Anesthesia Evaluation  Patient identified by MRN, date of birth, ID band Patient awake    Reviewed: Allergy & Precautions, NPO status , Patient's Chart, lab work & pertinent test results  Airway Mallampati: II  TM Distance: >3 FB Neck ROM: Full    Dental  (+) Teeth Intact, Dental Advisory Given   Pulmonary neg pulmonary ROS,    Pulmonary exam normal breath sounds clear to auscultation       Cardiovascular negative cardio ROS Normal cardiovascular exam Rhythm:Regular Rate:Normal     Neuro/Psych  Headaches, PSYCHIATRIC DISORDERS Anxiety Depression    GI/Hepatic Neg liver ROS, GERD  Poorly Controlled,  Endo/Other  Morbid obesity  Renal/GU negative Renal ROS     Musculoskeletal negative musculoskeletal ROS (+)   Abdominal   Peds  Hematology negative hematology ROS (+)   Anesthesia Other Findings Day of surgery medications reviewed with the patient.  Reproductive/Obstetrics Fibroids                             Anesthesia Physical Anesthesia Plan  ASA: III  Anesthesia Plan: General   Post-op Pain Management:    Induction: Intravenous  PONV Risk Score and Plan: 4 or greater and Scopolamine patch - Pre-op, Midazolam, Dexamethasone, Ondansetron and Propofol infusion  Airway Management Planned: Oral ETT  Additional Equipment:   Intra-op Plan:   Post-operative Plan: Extubation in OR  Informed Consent: I have reviewed the patients History and Physical, chart, labs and discussed the procedure including the risks, benefits and alternatives for the proposed anesthesia with the patient or authorized representative who has indicated his/her understanding and acceptance.   Dental advisory given  Plan Discussed with: CRNA  Anesthesia Plan Comments: (Risks/benefits of general anesthesia discussed with patient including risk of damage to teeth, lips, gum, and tongue, nausea/vomiting, allergic  reactions to medications, and the possibility of heart attack, stroke and death.  All patient questions answered.  Patient wishes to proceed.)       Anesthesia Quick Evaluation

## 2017-07-23 NOTE — Telephone Encounter (Signed)
Called to check on the patient, she had a hysteroscopy, polypectomy, D&C this morning. She is feeling great, no c/o.

## 2017-07-23 NOTE — Discharge Instructions (Signed)
No Advil, Aleve, Motrin, or Ibuprophen before 7:30 PM.  D & C Home care Instructions:   Personal hygiene:  Used sanitary napkins for vaginal drainage not tampons. Shower or tub bathe the day after your procedure. No douching until bleeding stops. Always wipe from front to back after  Elimination.  Activity: Do not drive or operate any equipment today. The effects of the anesthesia are still present and drowsiness may result. Rest today, not necessarily flat bed rest, just take it easy. You may resume your normal activity in one to 2 days.  Sexual activity: No intercourse for one week or as indicated by your physician  Diet: Eat a light diet as desired this evening. You may resume a regular diet tomorrow.  Return to work: One to 2 days.  General Expectations of your surgery: Vaginal bleeding should be no heavier than a normal period. Spotting may continue up to 10 days. Mild cramps may continue for a couple of days. You may have a regular period in 2-6 weeks.  Unexpected observations call your doctor if these occur: persistent or heavy bleeding. Severe abdominal cramping or pain. Elevation of temperature greater than 100F.  Call for an appointment in one week.   Post Anesthesia Home Care Instructions  Activity: Get plenty of rest for the remainder of the day. A responsible individual must stay with you for 24 hours following the procedure.  For the next 24 hours, DO NOT: -Drive a car -Paediatric nurse -Drink alcoholic beverages -Take any medication unless instructed by your physician -Make any legal decisions or sign important papers.  Meals: Start with liquid foods such as gelatin or soup. Progress to regular foods as tolerated. Avoid greasy, spicy, heavy foods. If nausea and/or vomiting occur, drink only clear liquids until the nausea and/or vomiting subsides. Call your physician if vomiting continues.  Special Instructions/Symptoms: Your throat may feel dry or sore from the  anesthesia or the breathing tube placed in your throat during surgery. If this causes discomfort, gargle with warm salt water. The discomfort should disappear within 24 hours.  If you had a scopolamine patch placed behind your ear for the management of post- operative nausea and/or vomiting:  1. The medication in the patch is effective for 72 hours, after which it should be removed.  Wrap patch in a tissue and discard in the trash. Wash hands thoroughly with soap and water. 2. You may remove the patch earlier than 72 hours if you experience unpleasant side effects which may include dry mouth, dizziness or visual disturbances. 3. Avoid touching the patch. Wash your hands with soap and water after contact with the patch.

## 2017-07-23 NOTE — Transfer of Care (Signed)
Immediate Anesthesia Transfer of Care Note  Patient: Adriana [name]  Procedure(s) Performed: Procedure(s) (LRB): DILATATION & CURETTAGE/HYSTEROSCOPY WITH MYOSURE (N/A)  Patient Location: PACU  Anesthesia Type: General  Level of Consciousness: awake, oriented, sedated and patient cooperative  Airway & Oxygen Therapy: Patient Spontanous Breathing and Patient connected to face mask oxygen  Post-op Assessment: Report given to PACU RN and Post -op Vital signs reviewed and stable  Post vital signs: Reviewed and stable  Complications: No apparent anesthesia complications Last Vitals:  Vitals:   07/23/17 1002 07/23/17 1330  BP: (!) 154/75 118/64  Pulse: 96 (!) 101  Resp: 18 16  Temp: 36.7 C 37 C  SpO2: 100% 100%    Last Pain:  Vitals:   07/23/17 1031  TempSrc:   PainSc: 3       Patients Stated Pain Goal: 3 (07/23/17 1031)

## 2017-07-23 NOTE — Interval H&P Note (Signed)
History and Physical Interval Note:  07/23/2017 12:41 PM  Adriana [name]  has presented today for surgery, with the diagnosis of PMB, endometrial polyp  The various methods of treatment have been discussed with the patient and family. After consideration of risks, benefits and other options for treatment, the patient has consented to  Procedure(s) with comments: [name] (N/A) - endometrial polyp as a surgical intervention .  The patient's history has been reviewed, patient examined, no change in status, stable for surgery.  I have reviewed the patient's chart and labs.  Questions were answered to the patient's satisfaction.     Salvadore Dom

## 2017-07-23 NOTE — Anesthesia Procedure Notes (Signed)
Procedure Name: Intubation Date/Time: 07/23/2017 12:48 PM Performed by: Suan Halter, CRNA Pre-anesthesia Checklist: Patient identified, Emergency Drugs available, Suction available and Patient being monitored Patient Re-evaluated:Patient Re-evaluated prior to induction Oxygen Delivery Method: Circle system utilized Preoxygenation: Pre-oxygenation with 100% oxygen Induction Type: IV induction Ventilation: Mask ventilation without difficulty Laryngoscope Size: Mac and 3 Grade View: Grade I Tube type: Oral Tube size: 7.0 mm Number of attempts: 1 Airway Equipment and Method: Stylet and Oral airway Placement Confirmation: ETT inserted through vocal cords under direct vision,  positive ETCO2 and breath sounds checked- equal and bilateral Secured at: 22 cm Tube secured with: Tape Dental Injury: Teeth and Oropharynx as per pre-operative assessment

## 2017-07-24 ENCOUNTER — Encounter (HOSPITAL_BASED_OUTPATIENT_CLINIC_OR_DEPARTMENT_OTHER): Payer: Self-pay | Admitting: Obstetrics and Gynecology

## 2017-07-25 NOTE — Anesthesia Postprocedure Evaluation (Signed)
Anesthesia Post Note  Patient: Adriana [name]  Procedure(s) Performed: DILATATION & CURETTAGE/HYSTEROSCOPY WITH MYOSURE (N/A Vagina )     Patient location during evaluation: PACU Anesthesia Type: General Level of consciousness: awake and alert Pain management: pain level controlled Vital Signs Assessment: post-procedure vital signs reviewed and stable Respiratory status: spontaneous breathing, nonlabored ventilation and respiratory function stable Cardiovascular status: blood pressure returned to baseline and stable Postop Assessment: no apparent nausea or vomiting Anesthetic complications: no    Last Vitals:  Vitals:   07/23/17 1400 07/23/17 1430  BP: 122/76 123/66  Pulse: 89 84  Resp: 12 14  Temp:  37.1 C  SpO2: 95% 98%    Last Pain:  Vitals:   07/23/17 1031  TempSrc:   PainSc: 3    Pain Goal: Patients Stated Pain Goal: 3 (07/23/17 1031)               Catalina Gravel

## 2017-08-08 ENCOUNTER — Other Ambulatory Visit: Payer: Self-pay

## 2017-08-08 ENCOUNTER — Ambulatory Visit: Payer: BLUE CROSS/BLUE SHIELD | Admitting: Obstetrics and Gynecology

## 2017-08-08 ENCOUNTER — Telehealth: Payer: Self-pay | Admitting: *Deleted

## 2017-08-08 ENCOUNTER — Encounter: Payer: Self-pay | Admitting: Obstetrics and Gynecology

## 2017-08-08 VITALS — BP 132/80 | HR 84 | Resp 16 | Wt 266.0 lb

## 2017-08-08 DIAGNOSIS — N63 Unspecified lump in unspecified breast: Secondary | ICD-10-CM

## 2017-08-08 DIAGNOSIS — Z800 Family history of malignant neoplasm of digestive organs: Secondary | ICD-10-CM | POA: Diagnosis not present

## 2017-08-08 DIAGNOSIS — N644 Mastodynia: Secondary | ICD-10-CM | POA: Diagnosis not present

## 2017-08-08 DIAGNOSIS — Z1211 Encounter for screening for malignant neoplasm of colon: Secondary | ICD-10-CM

## 2017-08-08 NOTE — Progress Notes (Signed)
Spoke with patient while in office. Orders placed for bilateral DX MMG and bilateral US, if needed, at [facility]. Call placed to [facility], scheduling closed. Advised patient [facility] will [facility] to schedule and return call. Patient provided mobile number [phone], leave detailed message if no answer. Patient request to schedule on Thursday or late Friday. 3 month f/u OV scheduled with Dr. Talbert Nan on 11/07/17 at 4pm.  See telephone encounter dated 08/08/17 for f/u.

## 2017-08-08 NOTE — Progress Notes (Signed)
GYNECOLOGY  VISIT   HPI: 57 y.o.   Divorced  Caucasian  female   G24P1011 with Patient's last menstrual period was 01/16/2014.   here for follow up. Patient is 16 days s/p D&C hysteroscopy, polypectomy for PMP bleeding. Pathology with benign polyp and atrophic endometrium. She c/o a 3 week h/o left breast pain, feels like it is pulling. The discomfort comes and goes. She drinks one to two cups of coffee a day, no change.  She has been declining mammograms. Also overdue for a colonoscopy. She had a consult with Dr Collene Mares, it wasn't a good fit, she requests referral to another MD.   GYNECOLOGIC HISTORY: Patient's last menstrual period was 01/16/2014. Contraception:postmenopause  Menopausal hormone therapy: none         OB History    Gravida Para Term Preterm AB Living   2 1 1  0 1 1   SAB TAB Ectopic Multiple Live Births   1 0 0 0 1         Patient Active Problem List   Diagnosis Date Noted  . Morbid obesity ([place]) 05/18/2017  . Anxiety and depression 04/04/2017  . Depression 11/15/2013  . Rectal bleeding 09/01/2013  . Benign paroxysmal positional vertigo 12/25/2012  . HEMORRHOIDS, INTERNAL 08/22/2009  . GERD 08/22/2009  . DYSPHAGIA UNSPECIFIED 08/22/2009  . EDEMA 12/03/2008  . DYSPNEA 12/03/2008    Past Medical History:  Diagnosis Date  . Anemia    history  . Anxiety   . Benign positional vertigo 12/19/13  . Depression   . Dyspnea    with exertion  . Edema   . Endometrial polyp   . Fibroids   . GERD (gastroesophageal reflux disease)   . Insomnia   . Internal and external hemorrhoids without complication   . Migraine   . Morbid obesity ([place])   . Plantar fasciitis   . PMB (postmenopausal bleeding)   . Rectal bleeding   . Reflux     Past Surgical History:  Procedure Laterality Date  . DILATATION & CURETTAGE/HYSTEROSCOPY WITH MYOSURE N/A 07/23/2017   Procedure: DILATATION & CURETTAGE/HYSTEROSCOPY WITH MYOSURE;  Surgeon: Salvadore Dom, MD;  Location: [facility];  Service: Gynecology;  Laterality: N/A;  endometrial polyp  . None      Current Outpatient Medications  Medication Sig Dispense Refill  . cholecalciferol (VITAMIN D) 1000 UNITS tablet Take 1,000 Units by mouth 3 (three) times a week.     . clonazePAM (KLONOPIN) 0.5 MG tablet Take 1/2 - 1 po BID prn anxiety 30 tablet 0  . phentermine (ADIPEX-P) 37.5 MG tablet Take 1 tablet (37.5 mg total) by mouth daily before breakfast. (Patient not taking: Reported on 06/14/2017) 30 tablet 0   No current facility-administered medications for this visit.      ALLERGIES: Hydrocodone  Family History  Problem Relation Age of Onset  . Colon cancer Father        diagnosed in late 68s.   . Hypertension Father   . Heart attack Father     Social History   Socioeconomic History  . Marital status: Divorced    Spouse name: Not on file  . Number of children: Not on file  . Years of education: Not on file  . Highest education level: Not on file  Social Needs  . Financial resource strain: Not on file  . Food insecurity - worry: Not on file  . Food insecurity - inability: Not on file  . Transportation needs - medical:  Not on file  . Transportation needs - non-medical: Not on file  Occupational History  . Occupation: Counsellor: Dance movement psychotherapist  Tobacco Use  . Smoking status: Never Smoker  . Smokeless tobacco: Never Used  Substance and Sexual Activity  . Alcohol use: Yes    Alcohol/week: 0.0 oz    Comment: 1 a month  . Drug use: No  . Sexual activity: Yes    Partners: Male    Birth control/protection: Post-menopausal  Other Topics Concern  . Not on file  Social History Narrative  . Not on file    Review of Systems  Constitutional: Negative.   HENT: Negative.   Eyes: Negative.   Respiratory: Negative.   Cardiovascular: Negative.   Gastrointestinal: Positive for constipation.  Genitourinary: Positive for frequency.       Breast discomfort -  left  Skin:       Hair loss  Psychiatric/Behavioral: Positive for depression.    PHYSICAL EXAMINATION:    BP 132/80 (BP Location: Right Arm, Patient Position: Sitting, Cuff Size: Normal)   Pulse 84   Resp 16   Wt 266 lb (120.7 kg)   LMP 01/16/2014   BMI 42.29 kg/m     General appearance: alert, cooperative and appears stated age Breasts: in the left breast there is an area of increased nodularity at 2 o'lcock just outside the areolar region, bean shaped, about a cm, not tender. She is tender in the periphery of the left breast in the upper outer quadrant. In the right breast at 9 o'clock is a cystic feeling 1.5 cm, smooth, mobile lump. No dimpling or skin changes.  No supraclavicular or axillary adenopathy.  Abdomen: soft, non-tender; non distended, no masses,  no organomegaly   ASSESSMENT Left mastalgia, bilateral lumps Overdue for colon cancer screening, Father with h/o colon cancer  S/P hysteroscopy, polypectomy, D&C. Benign pathology, atrophic endomtrium    PLAN Diagnostic breast imaging Will set her up for colonoscopy Call with any further vaginal bleeding   An After Visit Summary was printed and given to the patient.

## 2017-08-08 NOTE — Telephone Encounter (Signed)
Editor: Burnice Logan, RN (Registered Nurse)    Spoke with patient while in office. Orders placed for bilateral DX MMG and bilateral US, if needed, at [facility]. Call placed to [facility], scheduling closed. Advised patient [facility] will [facility] to schedule and return call. Patient provided mobile number [phone], leave detailed message if no answer. Patient request to schedule on Thursday or late Friday. 3 month f/u OV scheduled with Dr. Talbert Nan on 11/07/17 at 4pm.  See telephone encounter dated 08/08/17 for f/u.

## 2017-08-09 NOTE — Telephone Encounter (Signed)
Spoke with patient, [facility] directly to schedule. Patient is scheduled for bilateral dx MMG and [place] if needed on 08/13/17 at 1:30pm. Patient is agreeable to date and time.   Routing to provider for final review. Patient is agreeable to disposition. Will close encounter.

## 2017-08-13 ENCOUNTER — Ambulatory Visit
Admission: RE | Admit: 2017-08-13 | Discharge: 2017-08-13 | Disposition: A | Discharge status: Home / Self Care | Payer: BLUE CROSS/BLUE SHIELD | Source: Ambulatory Visit | Attending: Obstetrics and Gynecology | Admitting: Obstetrics and Gynecology

## 2017-08-13 DIAGNOSIS — R922 Inconclusive mammogram: Secondary | ICD-10-CM | POA: Diagnosis not present

## 2017-08-13 DIAGNOSIS — N63 Unspecified lump in unspecified breast: Secondary | ICD-10-CM

## 2017-08-13 DIAGNOSIS — N644 Mastodynia: Secondary | ICD-10-CM

## 2017-08-13 DIAGNOSIS — N6489 Other specified disorders of breast: Secondary | ICD-10-CM | POA: Diagnosis not present

## 2017-08-15 ENCOUNTER — Other Ambulatory Visit: Payer: BLUE CROSS/BLUE SHIELD

## 2017-08-15 ENCOUNTER — Telehealth: Payer: Self-pay | Admitting: Obstetrics and Gynecology

## 2017-08-15 NOTE — Telephone Encounter (Signed)
Call placed in reference to a referral. °

## 2017-08-21 NOTE — Telephone Encounter (Signed)
Call placed to patient in reference to a referral. °

## 2017-08-30 NOTE — Telephone Encounter (Signed)
Call placed to patient in reference to a referral. °

## 2017-09-12 ENCOUNTER — Ambulatory Visit: Payer: BLUE CROSS/BLUE SHIELD | Admitting: Nurse Practitioner

## 2017-10-23 DIAGNOSIS — M9901 Segmental and somatic dysfunction of cervical region: Secondary | ICD-10-CM | POA: Diagnosis not present

## 2017-10-23 DIAGNOSIS — M9902 Segmental and somatic dysfunction of thoracic region: Secondary | ICD-10-CM | POA: Diagnosis not present

## 2017-10-23 DIAGNOSIS — M546 Pain in thoracic spine: Secondary | ICD-10-CM | POA: Diagnosis not present

## 2017-10-23 DIAGNOSIS — M542 Cervicalgia: Secondary | ICD-10-CM | POA: Diagnosis not present

## 2017-11-07 ENCOUNTER — Encounter: Payer: Self-pay | Admitting: Obstetrics and Gynecology

## 2017-11-07 ENCOUNTER — Other Ambulatory Visit: Payer: Self-pay

## 2017-11-07 ENCOUNTER — Ambulatory Visit: Payer: BLUE CROSS/BLUE SHIELD | Admitting: Obstetrics and Gynecology

## 2017-11-07 VITALS — BP 100/60 | HR 88 | Resp 16 | Wt 265.0 lb

## 2017-11-07 DIAGNOSIS — N6011 Diffuse cystic mastopathy of right breast: Secondary | ICD-10-CM

## 2017-11-07 DIAGNOSIS — N644 Mastodynia: Secondary | ICD-10-CM

## 2017-11-07 DIAGNOSIS — N6012 Diffuse cystic mastopathy of left breast: Secondary | ICD-10-CM

## 2017-11-07 NOTE — Progress Notes (Signed)
GYNECOLOGY  VISIT   HPI: 57 y.o.   Divorced  Caucasian  female   [id number] with Patient's last menstrual period was 01/16/2014.   here for follow up of left breast pain. She was seen in 2/19 for pain and was noted to have bilateral breat lumps.   Breast imaging revealed at least 3 benign appearing masses in the right breast, none in the left. F/u breast imaging was recommended in 8/19.  She continues to have pain in the upper outer portion of the left breast. It is a pulling sensation in the breast, it waxes and wanes.   GYNECOLOGIC HISTORY: Patient's last menstrual period was 01/16/2014. Contraception:postmenopause  Menopausal hormone therapy: none         OB History    Gravida  2   Para  1   Term  1   Preterm  0   AB  1   Living  1     SAB  1   TAB  0   Ectopic  0   Multiple  0   Live Births  1              Patient Active Problem List   Diagnosis Date Noted  . Morbid obesity ([place]) 05/18/2017  . Anxiety and depression 04/04/2017  . Depression 11/15/2013  . Rectal bleeding 09/01/2013  . Benign paroxysmal positional vertigo 12/25/2012  . HEMORRHOIDS, INTERNAL 08/22/2009  . GERD 08/22/2009  . DYSPHAGIA UNSPECIFIED 08/22/2009  . EDEMA 12/03/2008  . DYSPNEA 12/03/2008    Past Medical History:  Diagnosis Date  . Anemia    history  . Anxiety   . Benign positional vertigo 12/19/13  . Depression   . Dyspnea    with exertion  . Edema   . Endometrial polyp   . Fibroids   . GERD (gastroesophageal reflux disease)   . Insomnia   . Internal and external hemorrhoids without complication   . Migraine   . Morbid obesity ([place])   . Plantar fasciitis   . PMB (postmenopausal bleeding)   . Rectal bleeding   . Reflux     Past Surgical History:  Procedure Laterality Date  . DILATATION & CURETTAGE/HYSTEROSCOPY WITH MYOSURE N/A 07/23/2017   Procedure: DILATATION & CURETTAGE/HYSTEROSCOPY WITH MYOSURE;  Surgeon: Salvadore Dom, MD;  Location: [facility];  Service: Gynecology;  Laterality: N/A;  endometrial polyp  . DILATION AND CURETTAGE OF UTERUS    . HYSTEROSCOPY    . None      Current Outpatient Medications  Medication Sig Dispense Refill  . cholecalciferol (VITAMIN D) 1000 UNITS tablet Take 1,000 Units by mouth 3 (three) times a week.     . clonazePAM (KLONOPIN) 0.5 MG tablet Take 1/2 - 1 po BID prn anxiety 30 tablet 0  . phentermine (ADIPEX-P) 37.5 MG tablet Take 1 tablet (37.5 mg total) by mouth daily before breakfast. (Patient not taking: Reported on 06/14/2017) 30 tablet 0   No current facility-administered medications for this visit.      ALLERGIES: Hydrocodone  Family History  Problem Relation Age of Onset  . Colon cancer Father        diagnosed in late 81s.   . Hypertension Father   . Heart attack Father     Social History   Socioeconomic History  . Marital status: Divorced    Spouse name: Not on file  . Number of children: Not on file  . Years of education: Not on file  . Highest  education level: Not on file  Occupational History  . Occupation: Counsellor: Dance movement psychotherapist  Social Needs  . Financial resource strain: Not on file  . Food insecurity:    Worry: Not on file    Inability: Not on file  . Transportation needs:    Medical: Not on file    Non-medical: Not on file  Tobacco Use  . Smoking status: Never Smoker  . Smokeless tobacco: Never Used  Substance and Sexual Activity  . Alcohol use: Yes    Alcohol/week: 0.0 oz    Comment: 1 a month  . Drug use: No  . Sexual activity: Yes    Partners: Male    Birth control/protection: Post-menopausal  Lifestyle  . Physical activity:    Days per week: Not on file    Minutes per session: Not on file  . Stress: Not on file  Relationships  . Social connections:    Talks on phone: Not on file    Gets together: Not on file    Attends religious service: Not on file    Active member of club or organization: Not on file     Attends meetings of clubs or organizations: Not on file    Relationship status: Not on file  . Intimate partner violence:    Fear of current or ex partner: Not on file    Emotionally abused: Not on file    Physically abused: Not on file    Forced sexual activity: Not on file  Other Topics Concern  . Not on file  Social History Narrative  . Not on file    Review of Systems  Constitutional: Negative.   HENT: Negative.   Eyes: Negative.   Respiratory: Negative.   Cardiovascular: Negative.   Gastrointestinal: Negative.   Genitourinary:       Left breast pain  Musculoskeletal:       Left heel pain  Skin:       Hair loss   Neurological: Negative.   Endo/Heme/Allergies: Negative.   Psychiatric/Behavioral: Positive for depression.       Anxiety     PHYSICAL EXAMINATION:    BP 100/60 (BP Location: Right Arm, Patient Position: Sitting, Cuff Size: Normal)   Pulse 88   Resp 16   Wt 265 lb (120.2 kg)   LMP 01/16/2014   BMI 42.13 kg/m     General appearance: alert, cooperative and appears stated age Breasts: stable 1.5 cm smooth, mobile lump in the right breast at 9 o'clock. No left breast lumps. Bilaterally with mild tenderness.   No cervical or supraclavicular adenopathy    ASSESSMENT Persistent mastalgia on the left Breast lump on the right is stable, no other abnormalities noted on exam today. Benign appearing masses in the right breast and normal left breast imaging in 2/19    PLAN Advised the patient to get a properly fitting bra (not wearing a supportive bra currently) Discussed trying evening primrose oil or vit E F/U breast imaging in 8/19 Patient should do monthly breast self exams, reviewed F/U for annual exam in 10/19   An After Visit Summary was printed and given to the patient.

## 2017-11-07 NOTE — Patient Instructions (Addendum)
To try and decrease your breast pain, you should have a well fitting supportive bra, cut back on caffeine, and use ice or heat as needed. Some women find relief with the supplement evening primrose oil or vit E.    Breast Self-Awareness Breast self-awareness means being familiar with how your breasts look and feel. It involves checking your breasts regularly and reporting any changes to your health care provider. Practicing breast self-awareness is important. A change in your breasts can be a sign of a serious medical problem. Being familiar with how your breasts look and feel allows you to find any problems early, when treatment is more likely to be successful. All women should practice breast self-awareness, including women who have had breast implants. How to do a breast self-exam One way to learn what is normal for your breasts and whether your breasts are changing is to do a breast self-exam. To do a breast self-exam: Look for Changes  1. Remove all the clothing above your waist. 2. Stand in front of a mirror in a room with good lighting. 3. Put your hands on your hips. 4. Push your hands firmly downward. 5. Compare your breasts in the mirror. Look for differences between them (asymmetry), such as: ? Differences in shape. ? Differences in size. ? Puckers, dips, and bumps in one breast and not the other. 6. Look at each breast for changes in your skin, such as: ? Redness. ? Scaly areas. 7. Look for changes in your nipples, such as: ? Discharge. ? Bleeding. ? Dimpling. ? Redness. ? A change in position. Feel for Changes  Carefully feel your breasts for lumps and changes. It is best to do this while lying on your back on the floor and again while sitting or standing in the shower or tub with soapy water on your skin. Feel each breast in the following way:  Place the arm on the side of the breast you are examining above your head.  Feel your breast with the other hand.  Start in  the nipple area and make  inch (2 cm) overlapping circles to feel your breast. Use the pads of your three middle fingers to do this. Apply light pressure, then medium pressure, then firm pressure. The light pressure will allow you to feel the tissue closest to the skin. The medium pressure will allow you to feel the tissue that is a little deeper. The firm pressure will allow you to feel the tissue close to the ribs.  Continue the overlapping circles, moving downward over the breast until you feel your ribs below your breast.  Move one finger-width toward the center of the body. Continue to use the  inch (2 cm) overlapping circles to feel your breast as you move slowly up toward your collarbone.  Continue the up and down exam using all three pressures until you reach your armpit.  Write Down What You Find  Write down what is normal for each breast and any changes that you find. Keep a written record with breast changes or normal findings for each breast. By writing this information down, you do not need to depend only on memory for size, tenderness, or location. Write down where you are in your menstrual cycle, if you are still menstruating. If you are having trouble noticing differences in your breasts, do not get discouraged. With time you will become more familiar with the variations in your breasts and more comfortable with the exam. How often should I examine my  breasts? Examine your breasts every month. If you are breastfeeding, the best time to examine your breasts is after a feeding or after using a breast pump. If you menstruate, the best time to examine your breasts is 5-7 days after your period is over. During your period, your breasts are lumpier, and it may be more difficult to notice changes. When should I see my health care provider? See your health care provider if you notice:  A change in shape or size of your breasts or nipples.  A change in the skin of your breast or nipples,  such as a reddened or scaly area.  Unusual discharge from your nipples.  A lump or thick area that was not there before.  Pain in your breasts.  Anything that concerns you.  This information is not intended to replace advice given to you by your health care provider. Make sure you discuss any questions you have with your health care provider. Document Released: 06/04/2005 Document Revised: 11/10/2015 Document Reviewed: 04/24/2015 Elsevier Interactive Patient Education  [employer].

## 2018-01-07 ENCOUNTER — Encounter: Payer: Self-pay | Admitting: Obstetrics and Gynecology

## 2018-01-09 ENCOUNTER — Ambulatory Visit: Payer: BLUE CROSS/BLUE SHIELD | Admitting: Nurse Practitioner

## 2018-01-09 ENCOUNTER — Encounter: Payer: Self-pay | Admitting: Nurse Practitioner

## 2018-01-09 VITALS — BP 120/80 | Ht 66.0 in | Wt 263.1 lb

## 2018-01-09 DIAGNOSIS — R7309 Other abnormal glucose: Secondary | ICD-10-CM | POA: Diagnosis not present

## 2018-01-09 DIAGNOSIS — K219 Gastro-esophageal reflux disease without esophagitis: Secondary | ICD-10-CM

## 2018-01-09 DIAGNOSIS — F32A Depression, unspecified: Secondary | ICD-10-CM

## 2018-01-09 DIAGNOSIS — Z1211 Encounter for screening for malignant neoplasm of colon: Secondary | ICD-10-CM

## 2018-01-09 DIAGNOSIS — F329 Major depressive disorder, single episode, unspecified: Secondary | ICD-10-CM

## 2018-01-09 DIAGNOSIS — F419 Anxiety disorder, unspecified: Secondary | ICD-10-CM | POA: Diagnosis not present

## 2018-01-09 LAB — POCT GLYCOSYLATED HEMOGLOBIN (HGB A1C): Hemoglobin A1C: 5.5 % (ref 4.0–5.6)

## 2018-01-09 NOTE — Patient Instructions (Signed)
Food Choices for Gastroesophageal Reflux Disease, Adult When you have gastroesophageal reflux disease (GERD), the foods you eat and your eating habits are very important. Choosing the right foods can help ease your discomfort. What guidelines do I need to follow?  Choose fruits, vegetables, whole grains, and low-fat dairy products.  Choose low-fat meat, fish, and poultry.  Limit fats such as oils, salad dressings, butter, nuts, and avocado.  Keep a food diary. This helps you identify foods that cause symptoms.  Avoid foods that cause symptoms. These may be different for everyone.  Eat small meals often instead of 3 large meals a day.  Eat your meals slowly, in a place where you are relaxed.  Limit fried foods.  Cook foods using methods other than frying.  Avoid drinking alcohol.  Avoid drinking large amounts of liquids with your meals.  Avoid bending over or lying down until 2-3 hours after eating. What foods are not recommended? These are some foods and drinks that may make your symptoms worse: Vegetables  Tomatoes. Tomato juice. Tomato and spaghetti sauce. Chili peppers. Onion and garlic. Horseradish. Fruits  Oranges, grapefruit, and lemon (fruit and juice). Meats  High-fat meats, fish, and poultry. This includes hot dogs, ribs, ham, sausage, salami, and bacon. Dairy  Whole milk and chocolate milk. Sour cream. Cream. Butter. Ice cream. Cream cheese. Drinks  Coffee and tea. Bubbly (carbonated) drinks or energy drinks. Condiments  Hot sauce. Barbecue sauce. Sweets/Desserts  Chocolate and cocoa. Donuts. Peppermint and spearmint. Fats and Oils  High-fat foods. This includes French fries and potato chips. Other  Vinegar. Strong spices. This includes black pepper, white pepper, red pepper, cayenne, curry powder, cloves, ginger, and chili powder. The items listed above may not be a complete list of foods and drinks to avoid. Contact your dietitian for more information.    This information is not intended to replace advice given to you by your health care provider. Make sure you discuss any questions you have with your health care provider. Document Released: 12/04/2011 Document Revised: 11/10/2015 Document Reviewed: 04/08/2013 Elsevier Interactive Patient Education  2017 Elsevier Inc.  

## 2018-01-10 ENCOUNTER — Encounter: Payer: Self-pay | Admitting: Nurse Practitioner

## 2018-01-10 NOTE — Progress Notes (Signed)
Subjective:  Presents for recheck on her anxiety and depression. Still having issues but defers other medications or counseling. Some relief with Klonopin. Has a very stressful job. Is considering reducing her hours to help with anxiety and stress. Also continues to have some family issues. Requesting letter for an emotional support animal which is her dog. Concerned about sugar, requesting testing. Having some GERD. Takes Nexium about 2-3 times per week. Coffee in the am. Denies alcohol or tobacco use. No regular NSAID use.   Objective:   BP 120/80   Ht 5\' 6"  (1.676 m)   Wt 263 lb 0.8 oz (119.3 kg)   LMP 01/16/2014   BMI 42.46 kg/m  NAD. Alert, oriented. Lungs clear. Heart RRR. Abdomen soft, non distended, mild epigastric area tenderness. Thoughts logical coherent and relevant. Mildly anxious affect.  Results for orders placed or performed in visit on 01/09/18  POCT glycosylated hemoglobin (Hb A1C)  Result Value Ref Range   Hemoglobin A1C 5.5 4.0 - 5.6 %   HbA1c POC (<> result, manual entry)  4.0 - 5.6 %   HbA1c, POC (prediabetic range)  5.7 - 6.4 %   HbA1c, POC (controlled diabetic range)  0.0 - 7.0 %     Assessment:   Problem List Items Addressed This Visit      Digestive   GERD - Primary   Relevant Orders   Ambulatory referral to Gastroenterology     Other   Anxiety and depression   Morbid obesity ([place])    Other Visit Diagnoses    Elevated glucose       Relevant Orders   POCT glycosylated hemoglobin (Hb A1C) (Completed)   Screen for colon cancer       Relevant Orders   Ambulatory referral to Gastroenterology       Plan:  Continue Nexium and current meds. Refer to GI for screening colonoscopy and possible endoscopy. Given information on dietary measures for GERD.  Encouraged regular activity and weight loss.  Return in about 4 months (around 05/12/2018).

## 2018-01-13 ENCOUNTER — Encounter: Payer: Self-pay | Admitting: Family Medicine

## 2018-01-13 ENCOUNTER — Telehealth: Payer: Self-pay | Admitting: Gastroenterology

## 2018-01-13 NOTE — Telephone Encounter (Signed)
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 Patient is being referred for GERD and discuss colon. Patient transferred care to [facility] but is wanting to come back to our office to see Dr. Silverio Decamp. She says that Pearson Forster, NP is referring her to Dr. Silverio Decamp.   Records are in [place].

## 2018-01-14 ENCOUNTER — Ambulatory Visit: Payer: BLUE CROSS/BLUE SHIELD | Admitting: Nurse Practitioner

## 2018-01-14 NOTE — Telephone Encounter (Signed)
ok 

## 2018-03-24 NOTE — Progress Notes (Signed)
57 y.o. G93P1011 Divorced White or Caucasian Not Hispanic or Latino female here for annual exam.  She is overdue for f/u breast imaging. She never got a call to schedule it. She c/o continued bilateral, intermittent breast pain, L>R. She has properly fitting bra's. She does drink coffee.  She is s/p hysteroscopy, d&c earlier this year, with benign polyp and atrophic endometrium. No more bleeding. No vasomotor symptoms. She c/o intermittent urinary frequency in the last 1-2 weeks. Some urgency to void, no dysuria. Slight suprapubic discomfort.  She and her partner broke up last month. Not currently sexually active. Partner with Schizophrenia. Only partner she had in 10 years.   Her depression is managed. She is doing okay.     Patient's last menstrual period was 01/16/2014.          Sexually active: No.  The current method of family planning is post menopausal status.    Exercising: No.  The patient does not participate in regular exercise at present. Smoker:  no  Health Maintenance: Pap:  03/26/2017 normal with neg HR HPV, 01/26/15 neg. HR HPV:neg  History of abnormal Pap: yes - years ago- had colposcopy  MMG:  08/13/2017 Birads 3 probably benign, due for follow up now Colonoscopy:  Never, scheduled for 04/25/18 BMD:   Never TDaP:  03/26/2017    reports that she has never smoked. She has never used smokeless tobacco. She reports that she drinks alcohol. She reports that she does not use drugs. Rare ETOH. 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 Anemia    history  . Anxiety   . Benign positional vertigo 12/19/13  . Depression   . Dyspnea    with exertion  . Edema   . Endometrial polyp   . Fibroids   . GERD (gastroesophageal reflux disease)   . Insomnia   . Internal and external hemorrhoids without complication   . Migraine   . Morbid obesity ([place])   . Plantar fasciitis   . PMB (postmenopausal bleeding)   . Rectal  bleeding   . Reflux     Past Surgical History:  Procedure Laterality Date  . DILATATION & CURETTAGE/HYSTEROSCOPY WITH MYOSURE N/A 07/23/2017   Procedure: DILATATION & CURETTAGE/HYSTEROSCOPY WITH MYOSURE;  Surgeon: Salvadore Dom, MD;  Location: [facility];  Service: Gynecology;  Laterality: N/A;  endometrial polyp  . DILATION AND CURETTAGE OF UTERUS    . HYSTEROSCOPY    . None      Current Outpatient Medications  Medication Sig Dispense Refill  . cholecalciferol (VITAMIN D) 1000 UNITS tablet Take 1,000 Units by mouth 3 (three) times a week.     . clonazePAM (KLONOPIN) 0.5 MG tablet Take 1/2 - 1 po BID prn anxiety 30 tablet 0  . Magnesium 400 MG TABS Take 1 tablet by mouth as needed.    . Multiple Vitamins-Minerals (EYE VITAMINS PO) Take 1 tablet by mouth as needed.    . Na Sulfate-K Sulfate-Mg Sulf (SUPREP BOWEL PREP KIT) 17.5-3.13-1.6 GM/177ML SOLN Take 1 kit by mouth once for 1 dose. 1 Bottle 0  . phentermine (ADIPEX-P) 37.5 MG tablet Take 37.5 mg by mouth daily before breakfast.     No current facility-administered medications for this visit.     Family History  Problem Relation Age of Onset  . Colon cancer Father        diagnosed in late 49s.   . Hypertension Father   .  Heart attack Father   . Esophageal cancer Neg Hx     Review of Systems  Constitutional: Negative.   HENT: Positive for congestion and sinus pressure.   Eyes: Negative.   Respiratory: Negative.   Cardiovascular: Positive for leg swelling.  Gastrointestinal: Negative.   Endocrine: Negative.        Excessive thirst  Genitourinary: Positive for frequency and urgency.       Left breast pain Nocturia  Musculoskeletal: Negative.   Skin: Negative.   Allergic/Immunologic: Negative.   Neurological: Negative.   Hematological: Negative.   Psychiatric/Behavioral: The patient is nervous/anxious.        Depression    Exam:   BP 126/84 (BP Location: Right Arm, Patient Position: Sitting,  Cuff Size: Normal)   Ht 5' 6.14" (1.68 m)   Wt 266 lb 6.4 oz (120.8 kg)   LMP 01/16/2014   BMI 42.81 kg/m   Weight change: @WEIGHTCHANGE @ Height:   Height: 5' 6.14" (168 cm)  Ht Readings from Last 3 Encounters:  03/27/18 5' 6.14" (1.68 m)  03/27/18 5' 6.5" (1.689 m)  01/09/18 5' 6"  (1.676 m)    General appearance: alert, cooperative and appears stated age Head: Normocephalic, without obvious abnormality, atraumatic Neck: no adenopathy, supple, symmetrical, trachea midline and thyroid normal to inspection and palpation Lungs: clear to auscultation bilaterally Cardiovascular: regular rate and rhythm Breasts: normal appearance, no masses or tenderness Abdomen: soft, non-tender; non distended,  no masses,  no organomegaly Extremities: extremities normal, atraumatic, no cyanosis or edema Skin: Skin color, texture, turgor normal. No rashes or lesions Lymph nodes: Cervical, supraclavicular, and axillary nodes normal. No abnormal inguinal nodes palpated Neurologic: Grossly normal   Pelvic: External genitalia:  no lesions              Urethra:  normal appearing urethra with no masses, tenderness or lesions              Bartholins and Skenes: normal                 Vagina: normal appearing vagina with normal color and discharge, no lesions              Cervix: no lesions               Bimanual Exam:  Uterus:  normal size, contour, position, consistency, mobility, non-tender and anteverted              Adnexa: no mass, fullness, tenderness               Rectovaginal: Confirms               Anus:  normal sphincter tone, no lesions  Chaperone was present for exam.  A:  Well Woman with normal exam  Continued mastalgia, overdue for f/u imaging  Intermittent urinary frequency and urgency, trace blood on dip  P:   Discussed breast self exam  Discussed calcium and vit D intake  No pap this year  Returning for fasting labs  Mammogram (we will schedule)  Colonoscopy scheduled  Send urine  for ua, c&s, she will call if her symptoms worsen prior to hearing about her culture results  Hydrate well

## 2018-03-25 ENCOUNTER — Ambulatory Visit: Payer: BLUE CROSS/BLUE SHIELD | Admitting: Gastroenterology

## 2018-03-27 ENCOUNTER — Ambulatory Visit: Payer: BLUE CROSS/BLUE SHIELD | Admitting: Obstetrics and Gynecology

## 2018-03-27 ENCOUNTER — Encounter: Payer: Self-pay | Admitting: Obstetrics and Gynecology

## 2018-03-27 ENCOUNTER — Encounter (INDEPENDENT_AMBULATORY_CARE_PROVIDER_SITE_OTHER): Payer: Self-pay

## 2018-03-27 ENCOUNTER — Other Ambulatory Visit: Payer: Self-pay

## 2018-03-27 ENCOUNTER — Encounter: Payer: Self-pay | Admitting: Gastroenterology

## 2018-03-27 ENCOUNTER — Telehealth: Payer: Self-pay | Admitting: *Deleted

## 2018-03-27 ENCOUNTER — Ambulatory Visit: Payer: BLUE CROSS/BLUE SHIELD | Admitting: Gastroenterology

## 2018-03-27 VITALS — BP 130/84 | HR 80 | Ht 66.5 in | Wt 264.4 lb

## 2018-03-27 VITALS — BP 126/84 | Ht 66.14 in | Wt 266.4 lb

## 2018-03-27 DIAGNOSIS — N644 Mastodynia: Secondary | ICD-10-CM

## 2018-03-27 DIAGNOSIS — R1319 Other dysphagia: Secondary | ICD-10-CM

## 2018-03-27 DIAGNOSIS — Z01419 Encounter for gynecological examination (general) (routine) without abnormal findings: Secondary | ICD-10-CM | POA: Diagnosis not present

## 2018-03-27 DIAGNOSIS — K219 Gastro-esophageal reflux disease without esophagitis: Secondary | ICD-10-CM | POA: Diagnosis not present

## 2018-03-27 DIAGNOSIS — R928 Other abnormal and inconclusive findings on diagnostic imaging of breast: Secondary | ICD-10-CM

## 2018-03-27 DIAGNOSIS — E559 Vitamin D deficiency, unspecified: Secondary | ICD-10-CM

## 2018-03-27 DIAGNOSIS — Z1211 Encounter for screening for malignant neoplasm of colon: Secondary | ICD-10-CM

## 2018-03-27 DIAGNOSIS — R350 Frequency of micturition: Secondary | ICD-10-CM | POA: Diagnosis not present

## 2018-03-27 DIAGNOSIS — Z6841 Body Mass Index (BMI) 40.0 and over, adult: Secondary | ICD-10-CM | POA: Diagnosis not present

## 2018-03-27 DIAGNOSIS — Z0000 Encounter for general adult medical examination without abnormal findings: Secondary | ICD-10-CM

## 2018-03-27 LAB — POCT URINALYSIS DIPSTICK
Bilirubin, UA: NEGATIVE
Glucose, UA: NEGATIVE
KETONES UA: NEGATIVE
Leukocytes, UA: NEGATIVE
NITRITE UA: NEGATIVE
PH UA: 5 (ref 5.0–8.0)
PROTEIN UA: NEGATIVE
RBC UA: POSITIVE
Spec Grav, UA: 1.01 (ref 1.010–1.025)
UROBILINOGEN UA: 0.2 U/dL

## 2018-03-27 MED ORDER — NA SULFATE-K SULFATE-MG SULF 17.5-3.13-1.6 GM/177ML PO SOLN: 1.0000 | Freq: Once | ORAL | 0 refills | Status: AC

## 2018-03-27 NOTE — Telephone Encounter (Signed)
Order placed for Dx MMG right breast and [place], if needed.   Call placed to [facility], spoke with Juliann Pulse. Scheduled for 03/31/18 arriving at 2:10pm for 2:30pm appt.

## 2018-03-27 NOTE — Telephone Encounter (Signed)
-----   Message from Salvadore Dom, MD sent at 03/27/2018  1:30 PM EDT ----- This patient is overdue for f/u diagnostic imaging of her right breast. Due in 8/19. Please schedule and call the patient with the time.  Thanks, Sharee Pimple

## 2018-03-27 NOTE — Patient Instructions (Signed)
You have been scheduled for an endoscopy and colonoscopy. Please follow the written instructions given to you at your visit today. Please pick up your prep supplies at the pharmacy within the next 1-3 days. If you use inhalers (even only as needed), please bring them with you on the day of your procedure. Your physician has requested that you go to www.startemmi.com and enter the access code given to you at your visit today. This web site gives a general overview about your procedure. However, you should still follow specific instructions given to you by our office regarding your preparation for the procedure.  If you are age 59 or older, your body mass index should be between 23-30. Your Body mass index is 42.03 kg/m. If this is out of the aforementioned range listed, please consider follow up with your Primary Care Provider.  If you are age 52 or younger, your body mass index should be between 19-25. Your Body mass index is 42.03 kg/m. If this is out of the aformentioned range listed, please consider follow up with your Primary Care Provider.    Thank you for choosing [facility]  Karleen Hampshire Nandigam,MD

## 2018-03-27 NOTE — Patient Instructions (Signed)
To try and decrease your breast pain, you should have a well fitting supportive bra, cut back on caffeine, and use ice or heat as needed. Some women find relief with the supplement evening primrose oil or vit E.  EXERCISE AND DIET:  We recommended that you start or continue a regular exercise program for good health. Regular exercise means any activity that makes your heart beat faster and makes you sweat.  We recommend exercising at least 30 minutes per day at least 3 days a week, preferably 4 or 5.  We also recommend a diet low in fat and sugar.  Inactivity, poor dietary choices and obesity can cause diabetes, heart attack, stroke, and kidney damage, among others.    ALCOHOL AND SMOKING:  Women should limit their alcohol intake to no more than 7 drinks/beers/glasses of wine (combined, not each!) per week. Moderation of alcohol intake to this level decreases your risk of breast cancer and liver damage. And of course, no recreational drugs are part of a healthy lifestyle.  And absolutely no smoking or even second hand smoke. Most people know smoking can cause heart and lung diseases, but did you know it also contributes to weakening of your bones? Aging of your skin?  Yellowing of your teeth and nails?  CALCIUM AND VITAMIN D:  Adequate intake of calcium and Vitamin D are recommended.  The recommendations for exact amounts of these supplements seem to change often, but generally speaking 600 mg of calcium (either carbonate or citrate) and 800 units of Vitamin D per day seems prudent. Certain women may benefit from higher intake of Vitamin D.  If you are among these women, your doctor will have told you during your visit.    PAP SMEARS:  Pap smears, to check for cervical cancer or precancers,  have traditionally been done yearly, although recent scientific advances have shown that most women can have pap smears less often.  However, every woman still should have a physical exam from her gynecologist every  year. It will include a breast check, inspection of the vulva and vagina to check for abnormal growths or skin changes, a visual exam of the cervix, and then an exam to evaluate the size and shape of the uterus and ovaries.  And after 55 years of age, a rectal exam is indicated to check for rectal cancers. We will also provide age appropriate advice regarding health maintenance, like when you should have certain vaccines, screening for sexually transmitted diseases, bone density testing, colonoscopy, mammograms, etc.   MAMMOGRAMS:  All women over 30 years old should have a yearly mammogram. Many facilities now offer a "3D" mammogram, which may cost around $50 extra out of pocket. If possible,  we recommend you accept the option to have the 3D mammogram performed.  It both reduces the number of women who will be called back for extra views which then turn out to be normal, and it is better than the routine mammogram at detecting truly abnormal areas.    COLONOSCOPY:  Colonoscopy to screen for colon cancer is recommended for all women at age 55.  We know, you hate the idea of the prep.  We agree, BUT, having colon cancer and not knowing it is worse!!  Colon cancer so often starts as a polyp that can be seen and removed at colonscopy, which can quite literally save your life!  And if your first colonoscopy is normal and you have no family history of colon cancer, most women don't  have to have it again for 10 years.  Once every ten years, you can do something that may end up saving your life, right?  We will be happy to help you get it scheduled when you are ready.  Be sure to check your insurance coverage so you understand how much it will cost.  It may be covered as a preventative service at no cost, but you should check your particular policy.

## 2018-03-27 NOTE — Telephone Encounter (Signed)
Left detailed message, ok per dpr. Advised of appointment details as seen below for [facility]. Instructed patient to contact [facility] directly at [phone] if you need to make scheduling changes.  Return call to office if any additional questions.   Routing to provider for final review. Patient is agreeable to disposition. Will close encounter.

## 2018-03-27 NOTE — Progress Notes (Signed)
Adriana [name]    [id number]    1960-11-24  Primary Care Physician:Luking, Grace Bushy, MD  Referring Physician: Mikey Kirschner, MD [place], [place] [address]  Chief complaint: GERD  HPI: 57 year old female with history of anxiety and depression disorder here to establish care.  She has chronic history of GERD with heartburn for past 8 to 10 years, takes Nexium intermittently.  She has constant burning sensation in the throat and intermittent cough. At night when she lays she feels esophagus is closed and is chocking on saliva Sometimes when she is eating chicken or thick cut fries she has difficulty swallowing, worse when she is in the car She always feels like a huge potato chip is stuck in the throat. She was drinking vinegar in the past but no longer doing it to help improve GERD symptoms He is currently drinking lemon juice with green tea before going to bed and it  seems to help  She was constipated before but since she started eating [employer] whole-grain bread, is having daily bowel movement Denies any loss of appetite, weight loss, melena, vomiting or blood per rectum She never had colonoscopy.  She had EGD by Dr. Laural Golden in ?2012, report not available  Outpatient Encounter Medications as of 03/27/2018  Medication Sig  . cholecalciferol (VITAMIN D) 1000 UNITS tablet Take 1,000 Units by mouth 3 (three) times a week.   . clonazePAM (KLONOPIN) 0.5 MG tablet Take 1/2 - 1 po BID prn anxiety  . Magnesium 400 MG TABS Take 1 tablet by mouth as needed.  . Multiple Vitamins-Minerals (EYE VITAMINS PO) Take 1 tablet by mouth as needed.  . phentermine (ADIPEX-P) 37.5 MG tablet Take 37.5 mg by mouth daily before breakfast.  . [DISCONTINUED] phentermine (ADIPEX-P) 37.5 MG tablet Take 1 tablet (37.5 mg total) by mouth daily before breakfast.   No facility-administered encounter medications on file as of 03/27/2018.     Allergies as of 03/27/2018 -  Review Complete 03/27/2018  Allergen Reaction Noted  . Hydrocodone Nausea And Vomiting 11/12/2013    Past Medical History:  Diagnosis Date  . Anemia    history  . Anxiety   . Benign positional vertigo 12/19/13  . Depression   . Dyspnea    with exertion  . Edema   . Endometrial polyp   . Fibroids   . GERD (gastroesophageal reflux disease)   . Insomnia   . Internal and external hemorrhoids without complication   . Migraine   . Morbid obesity ([place])   . Plantar fasciitis   . PMB (postmenopausal bleeding)   . Rectal bleeding   . Reflux     Past Surgical History:  Procedure Laterality Date  . DILATATION & CURETTAGE/HYSTEROSCOPY WITH MYOSURE N/A 07/23/2017   Procedure: DILATATION & CURETTAGE/HYSTEROSCOPY WITH MYOSURE;  Surgeon: Salvadore Dom, MD;  Location: [facility];  Service: Gynecology;  Laterality: N/A;  endometrial polyp  . DILATION AND CURETTAGE OF UTERUS    . HYSTEROSCOPY    . None      Family History  Problem Relation Age of Onset  . Colon cancer Father        diagnosed in late 18s.   . Hypertension Father   . Heart attack Father   . Esophageal cancer Neg Hx     Social History   Socioeconomic History  . Marital status: Divorced    Spouse name: Not on file  .  Number of children: 1  . Years of education: Not on file  . Highest education level: Not on file  Occupational History  . Occupation: Counsellor: Dance movement psychotherapist  Social Needs  . Financial resource strain: Not on file  . Food insecurity:    Worry: Not on file    Inability: Not on file  . Transportation needs:    Medical: Not on file    Non-medical: Not on file  Tobacco Use  . Smoking status: Never Smoker  . Smokeless tobacco: Never Used  Substance and Sexual Activity  . Alcohol use: Yes    Alcohol/week: 0.0 standard drinks    Comment: 1 a month  . Drug use: No  . Sexual activity: Yes    Partners: Male    Birth control/protection:  Post-menopausal  Lifestyle  . Physical activity:    Days per week: Not on file    Minutes per session: Not on file  . Stress: Not on file  Relationships  . Social connections:    Talks on phone: Not on file    Gets together: Not on file    Attends religious service: Not on file    Active member of club or organization: Not on file    Attends meetings of clubs or organizations: Not on file    Relationship status: Not on file  . Intimate partner violence:    Fear of current or ex partner: Not on file    Emotionally abused: Not on file    Physically abused: Not on file    Forced sexual activity: Not on file  Other Topics Concern  . Not on file  Social History Narrative  . Not on file      Review of systems: Review of Systems  Constitutional: Negative for fever and chills.  HENT: Negative.   Eyes: Negative for blurred vision.  Respiratory: Negative for  shortness of breath and wheezing.  positive for cough  cardiovascular: Negative for chest pain and palpitations.  Gastrointestinal: as per HPI Genitourinary: Negative for dysuria, urgency, frequency and hematuria.  Musculoskeletal: Positive for myalgias, back pain and joint pain.  Skin: Negative for itching and rash.  Neurological: Negative for dizziness, tremors, focal weakness, seizures and loss of consciousness.  Endo/Heme/Allergies: Positive for seasonal allergies.  Psychiatric/Behavioral: Negative for suicidal ideas and hallucinations.  Positive anxiety and depression All other systems reviewed and are negative.   Physical Exam: Vitals:   03/27/18 1054  BP: 130/84  Pulse: 80   Body mass index is 42.03 kg/m. Gen:      No acute distress HEENT:  EOMI, sclera anicteric Neck:     No masses; no thyromegaly Lungs:    Clear to auscultation bilaterally; normal respiratory effort CV:         Regular rate and rhythm; no murmurs Abd:      + bowel sounds; soft, non-tender; no palpable masses, no distension Ext:    No  edema; adequate peripheral perfusion Skin:      Warm and dry; no rash Neuro: alert and oriented x 3 Psych: normal mood and affect  Data Reviewed:  Reviewed labs, radiology imaging, old records and pertinent past GI work up   Assessment and Plan/Recommendations:  57 year old female with history of morbid obesity, anxiety and depression, chronic GERD here with complaints of dysphagia, odynophagia and globus sensation  Start omeprazole 40 mg daily, 30 minutes before breakfast Discussed antireflux measures and lifestyle modification  Scheduled for EGD for evaluation  and possible esophageal dilation if needed  Patient is past due for colorectal cancer screening We will schedule for colonoscopy along with EGD  The risks and benefits as well as alternatives of endoscopic procedure(s) have been discussed and reviewed. All questions answered. The patient agrees to proceed.    Damaris Hippo , MD [phone]    CC: Mikey Kirschner, MD

## 2018-03-28 LAB — URINALYSIS, MICROSCOPIC ONLY: Casts: NONE SEEN /lpf

## 2018-03-29 LAB — URINE CULTURE

## 2018-03-31 ENCOUNTER — Other Ambulatory Visit: Payer: BLUE CROSS/BLUE SHIELD

## 2018-04-01 ENCOUNTER — Telehealth: Payer: Self-pay

## 2018-04-01 NOTE — Telephone Encounter (Signed)
Spoke with patient regarding her follow up mammogram and ultrasound.  Advised her she was due for an appointment.  Patient will call and schedule appointment.  Information given.

## 2018-04-14 ENCOUNTER — Other Ambulatory Visit: Payer: Self-pay | Admitting: Gastroenterology

## 2018-04-14 NOTE — Telephone Encounter (Signed)
Please send Reglan 5mg   X6 tablets. She takes 1 tablet 30 mins before she starts drinking the prep and can take upto 3 times daily as needed. Thanks

## 2018-04-14 NOTE — Telephone Encounter (Signed)
Dr Treasa School please advise   I dont have anything on my paperwork for her about this

## 2018-04-14 NOTE — Telephone Encounter (Signed)
Pt calling stating that Dr. Silverio Decamp was going to prescribe something for nausea to take when she has to do prep for porc. But it has not been sent yet. She uses [facility] on [address] In [place].

## 2018-04-15 MED ORDER — METOCLOPRAMIDE HCL 5 MG PO TABS: ORAL_TABLET | ORAL | 0 refills | Status: DC

## 2018-04-15 NOTE — Telephone Encounter (Signed)
Informed patient that we are sending Reglan to the pharmacy and to take prior to each dose of prep. Patient verbalized understanding.

## 2018-04-17 ENCOUNTER — Telehealth: Payer: Self-pay | Admitting: Gastroenterology

## 2018-04-17 MED ORDER — NA SULFATE-K SULFATE-MG SULF 17.5-3.13-1.6 GM/177ML PO SOLN: 1.0000 | Freq: Once | ORAL | 0 refills | Status: DC

## 2018-04-17 NOTE — Telephone Encounter (Signed)
I spoke with the patient and per her request I called the [facility] in [place] to check the suprep cost, it was going to be $95.00. She had rather get it at the [facility] so I called and gave them the coupon codes to get it for $50.00.  Adriana [name] has been informed and said thank you so much.

## 2018-04-17 NOTE — Telephone Encounter (Signed)
Patient states that prep given to her this time is too expensive, would like something cheaper

## 2018-04-23 ENCOUNTER — Telehealth: Payer: Self-pay | Admitting: Gastroenterology

## 2018-04-23 MED ORDER — METOCLOPRAMIDE HCL 5 MG PO TABS: 5.0000 mg | ORAL_TABLET | Freq: Four times a day (QID) | ORAL | Status: DC

## 2018-04-23 NOTE — Telephone Encounter (Signed)
Spoke with pt and reviewed prep instructions and how to take her Reglan. Pt states she picked up the RX for Regan , but lost it in the house. She is very anxious about having her procedure and was asking a lot questions. I answered her questions and informed pt I will send another Rx for Reglan to her pharmacy. The pt will call back if she has further questions. Gwyndolyn Saxon in [facility]

## 2018-04-25 ENCOUNTER — Encounter: Payer: Self-pay | Admitting: Gastroenterology

## 2018-04-25 ENCOUNTER — Ambulatory Visit (AMBULATORY_SURGERY_CENTER): Payer: BLUE CROSS/BLUE SHIELD | Admitting: Gastroenterology

## 2018-04-25 VITALS — BP 140/67 | HR 71 | Temp 97.3°F | Resp 15 | Ht 66.0 in | Wt 264.0 lb

## 2018-04-25 DIAGNOSIS — K317 Polyp of stomach and duodenum: Secondary | ICD-10-CM

## 2018-04-25 DIAGNOSIS — D122 Benign neoplasm of ascending colon: Secondary | ICD-10-CM

## 2018-04-25 DIAGNOSIS — K219 Gastro-esophageal reflux disease without esophagitis: Secondary | ICD-10-CM

## 2018-04-25 DIAGNOSIS — D125 Benign neoplasm of sigmoid colon: Secondary | ICD-10-CM

## 2018-04-25 DIAGNOSIS — K635 Polyp of colon: Secondary | ICD-10-CM | POA: Diagnosis not present

## 2018-04-25 DIAGNOSIS — R1013 Epigastric pain: Secondary | ICD-10-CM

## 2018-04-25 DIAGNOSIS — K625 Hemorrhage of anus and rectum: Secondary | ICD-10-CM | POA: Diagnosis not present

## 2018-04-25 DIAGNOSIS — K2950 Unspecified chronic gastritis without bleeding: Secondary | ICD-10-CM | POA: Diagnosis not present

## 2018-04-25 DIAGNOSIS — Z1211 Encounter for screening for malignant neoplasm of colon: Secondary | ICD-10-CM | POA: Diagnosis not present

## 2018-04-25 DIAGNOSIS — G8929 Other chronic pain: Secondary | ICD-10-CM

## 2018-04-25 MED ORDER — OMEPRAZOLE 40 MG PO CPDR: 40.0000 mg | DELAYED_RELEASE_CAPSULE | Freq: Two times a day (BID) | ORAL | 3 refills | Status: DC

## 2018-04-25 MED ORDER — SODIUM CHLORIDE 0.9 % IV SOLN: 500.0000 mL | Freq: Once | INTRAVENOUS | Status: DC

## 2018-04-25 NOTE — Op Note (Signed)
[place] Patient Name: Adriana [name] Procedure Date: 04/25/2018 1:04 PM MRN: [id number] Endoscopist: Mauri Pole , MD Age: 57 Referring MD:  Date of Birth: October 13, 1960 Gender: Female Account #: [id number] Procedure:                Upper GI endoscopy Indications:              Dysphagia, Esophageal reflux symptoms that persist                            despite appropriate therapy Medicines:                Monitored Anesthesia Care Procedure:                Pre-Anesthesia Assessment:                           - Prior to the procedure, a History and Physical                            was performed, and patient medications and                            allergies were reviewed. The patient's tolerance of                            previous anesthesia was also reviewed. The risks                            and benefits of the procedure and the sedation                            options and risks were discussed with the patient.                            All questions were answered, and informed consent                            was obtained. Prior Anticoagulants: The patient has                            taken no previous anticoagulant or antiplatelet                            agents. ASA Grade Assessment: III - A patient with                            severe systemic disease. After reviewing the risks                            and benefits, the patient was deemed in                            satisfactory condition to undergo the procedure.  After obtaining informed consent, the endoscope was                            passed under direct vision. Throughout the                            procedure, the patient's blood pressure, pulse, and                            oxygen saturations were monitored continuously. The                            Model GIF-HQ190 [id number]) scope was introduced                            through the  mouth, and advanced to the second part                            of duodenum. The upper GI endoscopy was                            accomplished without difficulty. The patient                            tolerated the procedure well. Scope In: Scope Out: Findings:                 LA Grade C (one or more mucosal breaks continuous                            between tops of 2 or more mucosal folds, less than                            75% circumference) esophagitis with no bleeding was                            found 34 to 36 cm from the incisors. Biopsies were                            taken with a cold forceps for histology.                           Patchy mild inflammation characterized by                            congestion (edema) and erythema was found in the                            entire examined stomach. Biopsies were taken with a                            cold forceps for Helicobacter pylori testing.  Multiple 1 to 7 mm pedunculated and sessile polyps                            were found in the gastric fundus and in the gastric                            body. The polyp was removed with a cold biopsy                            forceps. Resection and retrieval were complete.                           The examined duodenum was normal. Complications:            No immediate complications. Estimated Blood Loss:     Estimated blood loss was minimal. Impression:               - LA Grade C reflux esophagitis. Biopsied.                           - Gastritis. Biopsied.                           - Multiple gastric polyps. Resected and retrieved.                           - Normal examined duodenum. Recommendation:           - Patient has a contact number available for                            emergencies. The signs and symptoms of potential                            delayed complications were discussed with the                            patient.  Return to normal activities tomorrow.                            Written discharge instructions were provided to the                            patient.                           - Resume previous diet.                           - Continue present medications.                           - Await pathology results.                           - Repeat upper endoscopy after studies are complete  for surveillance based on pathology results and                            repeat EGD with dilation.                           - Follow an antireflux regimen.                           - Use Prilosec (omeprazole) 40 mg PO BID for 2                            months followed by once daily. Mauri Pole, MD 04/25/2018 2:02:16 PM This report has been signed electronically.

## 2018-04-25 NOTE — Op Note (Signed)
[place] Patient Name: Adriana [name] Procedure Date: 04/25/2018 1:04 PM MRN: [id number] Endoscopist: Mauri Pole , MD Age: 57 Referring MD:  Date of Birth: 1960-10-09 Gender: Female Account #: [id number] Procedure:                Colonoscopy Indications:              Screening for colorectal malignant neoplasm Medicines:                Monitored Anesthesia Care Procedure:                Pre-Anesthesia Assessment:                           - Prior to the procedure, a History and Physical                            was performed, and patient medications and                            allergies were reviewed. The patient's tolerance of                            previous anesthesia was also reviewed. The risks                            and benefits of the procedure and the sedation                            options and risks were discussed with the patient.                            All questions were answered, and informed consent                            was obtained. Prior Anticoagulants: The patient has                            taken no previous anticoagulant or antiplatelet                            agents. ASA Grade Assessment: III - A patient with                            severe systemic disease. After reviewing the risks                            and benefits, the patient was deemed in                            satisfactory condition to undergo the procedure.                           After obtaining informed consent, the colonoscope  was passed under direct vision. Throughout the                            procedure, the patient's blood pressure, pulse, and                            oxygen saturations were monitored continuously. The                            Colonoscope was introduced through the anus and                            advanced to the the cecum, identified by                            appendiceal  orifice and ileocecal valve. The                            colonoscopy was performed without difficulty. The                            patient tolerated the procedure well. The quality                            of the bowel preparation was adequate. The                            ileocecal valve, appendiceal orifice, and rectum                            were photographed. Scope In: 1:26:38 PM Scope Out: 1:50:48 PM Scope Withdrawal Time: 0 hours 19 minutes 26 seconds  Total Procedure Duration: 0 hours 24 minutes 10 seconds  Findings:                 The perianal and digital rectal examinations were                            normal.                           Two sessile polyps were found in the sigmoid colon                            and ascending colon. The polyps were 2 to 3 mm in                            size. These polyps were removed with a cold biopsy                            forceps. Resection and retrieval were complete.                           Three sessile polyps were found in the sigmoid  colon and ascending colon. The polyps were 4 to 8                            mm in size. These polyps were removed with a cold                            snare. Resection and retrieval were complete.                           Scattered small-mouthed diverticula were found in                            the sigmoid colon and ascending colon.                           Non-bleeding internal hemorrhoids were found during                            retroflexion. The hemorrhoids were small. Complications:            No immediate complications. Estimated Blood Loss:     Estimated blood loss was minimal. Impression:               - Two 2 to 3 mm polyps in the sigmoid colon and in                            the ascending colon, removed with a cold biopsy                            forceps. Resected and retrieved.                           - Three 4 to 8 mm polyps in  the sigmoid colon and                            in the ascending colon, removed with a cold snare.                            Resected and retrieved.                           - Diverticulosis in the sigmoid colon and in the                            ascending colon.                           - Non-bleeding internal hemorrhoids. Recommendation:           - Patient has a contact number available for                            emergencies. The signs and symptoms of potential  delayed complications were discussed with the                            patient. Return to normal activities tomorrow.                            Written discharge instructions were provided to the                            patient.                           - Resume previous diet.                           - Continue present medications.                           - Await pathology results.                           - Repeat colonoscopy in 3 - 5 years for                            surveillance based on pathology results. Mauri Pole, MD 04/25/2018 2:05:05 PM This report has been signed electronically.

## 2018-04-25 NOTE — Progress Notes (Signed)
Report given to PACU, vss 

## 2018-04-25 NOTE — Patient Instructions (Signed)
YOU HAD AN ENDOSCOPIC PROCEDURE TODAY AT [facility]:   Refer to the procedure report that was given to you for any specific questions about what was found during the examination.  If the procedure report does not answer your questions, please call your gastroenterologist to clarify.  If you requested that your care partner not be given the details of your procedure findings, then the procedure report has been included in a sealed envelope for you to review at your convenience later.  YOU SHOULD EXPECT: Some feelings of bloating in the abdomen. Passage of more gas than usual.  Walking can help get rid of the air that was put into your GI tract during the procedure and reduce the bloating. If you had a lower endoscopy (such as a colonoscopy or flexible sigmoidoscopy) you may notice spotting of blood in your stool or on the toilet paper. If you underwent a bowel prep for your procedure, you may not have a normal bowel movement for a few days.  Please Note:  You might notice some irritation and congestion in your nose or some drainage.  This is from the oxygen used during your procedure.  There is no need for concern and it should clear up in a day or so.  SYMPTOMS TO REPORT IMMEDIATELY:   Following lower endoscopy (colonoscopy or flexible sigmoidoscopy):  Excessive amounts of blood in the stool  Significant tenderness or worsening of abdominal pains  Swelling of the abdomen that is new, acute  Fever of 100F or higher   Following upper endoscopy (EGD)  Vomiting of blood or coffee ground material  New chest pain or pain under the shoulder blades  Painful or persistently difficult swallowing  New shortness of breath  Fever of 100F or higher  Black, tarry-looking stools  For urgent or emergent issues, a gastroenterologist can be reached at any hour by calling [phone].   DIET:  We do recommend a small meal at first, but then you may proceed to your regular diet.  Drink  plenty of fluids but you should avoid alcoholic beverages for 24 hours.  MEDICATIONS: Continue present medications. Use Prilosec (omeprazole) 40 mg by mouth twice daily for 2 months followed by once daily.   Follow an Antireflux regimen (see handout).   Please see handouts given to you by your recovery nurse.  FOLLOW UP: Repeat upper endoscopy as per Dr. Woodward Ku instructions once pathology results are returned.  ACTIVITY:  You should plan to take it easy for the rest of today and you should NOT DRIVE or use heavy machinery until tomorrow (because of the sedation medicines used during the test).    FOLLOW UP: Our staff will call the number listed on your records the next business day following your procedure to check on you and address any questions or concerns that you may have regarding the information given to you following your procedure. If we do not reach you, we will leave a message.  However, if you are feeling well and you are not experiencing any problems, there is no need to return our call.  We will assume that you have returned to your regular daily activities without incident.  If any biopsies were taken you will be contacted by phone or by letter within the next 1-3 weeks.  Please call us at [phone] if you have not heard about the biopsies in 3 weeks.    SIGNATURES/CONFIDENTIALITY: You and/or your care partner have signed paperwork which will be  entered into your electronic medical record.  These signatures attest to the fact that that the information above on your After Visit Summary has been reviewed and is understood.  Full responsibility of the confidentiality of this discharge information lies with you and/or your care-partner. 

## 2018-04-25 NOTE — Progress Notes (Signed)
Called to room to assist during endoscopic procedure.  Patient ID and intended procedure confirmed with present staff. Received instructions for my participation in the procedure from the performing physician.  

## 2018-04-28 ENCOUNTER — Telehealth: Payer: Self-pay | Admitting: *Deleted

## 2018-04-28 NOTE — Telephone Encounter (Signed)
  Follow up Call-  Call back number 04/25/2018  Post procedure Call Back phone  # [phone]  Permission to leave phone message Yes  Some recent data might be hidden     Patient questions:  Do you have a fever, pain , or abdominal swelling? No. Pain Score  0 *  Have you tolerated food without any problems? Yes.    Have you been able to return to your normal activities? Yes.    Do you have any questions about your discharge instructions: Diet   No. Medications  No. Follow up visit  No.  Do you have questions or concerns about your Care? No.  Actions: * If pain score is 4 or above: No action needed, pain <4.

## 2018-05-05 ENCOUNTER — Encounter: Payer: Self-pay | Admitting: Gastroenterology

## 2018-05-12 ENCOUNTER — Ambulatory Visit: Payer: BLUE CROSS/BLUE SHIELD | Admitting: Family Medicine

## 2018-05-29 ENCOUNTER — Other Ambulatory Visit (INDEPENDENT_AMBULATORY_CARE_PROVIDER_SITE_OTHER): Payer: BLUE CROSS/BLUE SHIELD

## 2018-05-29 ENCOUNTER — Other Ambulatory Visit: Payer: Self-pay | Admitting: Obstetrics and Gynecology

## 2018-05-29 ENCOUNTER — Other Ambulatory Visit: Payer: BLUE CROSS/BLUE SHIELD

## 2018-05-29 DIAGNOSIS — Z0000 Encounter for general adult medical examination without abnormal findings: Secondary | ICD-10-CM

## 2018-05-29 DIAGNOSIS — E559 Vitamin D deficiency, unspecified: Secondary | ICD-10-CM

## 2018-05-29 DIAGNOSIS — Z6841 Body Mass Index (BMI) 40.0 and over, adult: Secondary | ICD-10-CM

## 2018-05-30 LAB — CBC
Hematocrit: 35.8 % (ref 34.0–46.6)
Hemoglobin: 11.9 g/dL (ref 11.1–15.9)
MCH: 30.2 pg (ref 26.6–33.0)
MCHC: 33.2 g/dL (ref 31.5–35.7)
MCV: 91 fL (ref 79–97)
Platelets: 218 10*3/uL (ref 150–450)
RBC: 3.94 x10E6/uL (ref 3.77–5.28)
RDW: 12.9 % (ref 12.3–15.4)
WBC: 4 10*3/uL (ref 3.4–10.8)

## 2018-05-30 LAB — COMPREHENSIVE METABOLIC PANEL
A/G RATIO: 1.8 (ref 1.2–2.2)
ALBUMIN: 4.2 g/dL (ref 3.5–5.5)
ALT: 15 IU/L (ref 0–32)
AST: 17 IU/L (ref 0–40)
Alkaline Phosphatase: 77 IU/L (ref 39–117)
BUN/Creatinine Ratio: 16 (ref 9–23)
BUN: 12 mg/dL (ref 6–24)
Bilirubin Total: 0.2 mg/dL (ref 0.0–1.2)
CO2: 26 mmol/L (ref 20–29)
Calcium: 9.1 mg/dL (ref 8.7–10.2)
Chloride: 102 mmol/L (ref 96–106)
Creatinine, Ser: 0.75 mg/dL (ref 0.57–1.00)
GFR calc Af Amer: 104 mL/min/{1.73_m2} (ref >59)
GFR calc non Af Amer: 90 mL/min/{1.73_m2} (ref >59)
GLOBULIN, TOTAL: 2.4 g/dL (ref 1.5–4.5)
Glucose: 89 mg/dL (ref 65–99)
POTASSIUM: 4.1 mmol/L (ref 3.5–5.2)
Sodium: 144 mmol/L (ref 134–144)
Total Protein: 6.6 g/dL (ref 6.0–8.5)

## 2018-05-30 LAB — VITAMIN D 25 HYDROXY (VIT D DEFICIENCY, FRACTURES): VIT D 25 HYDROXY: 18.5 ng/mL — AB (ref 30.0–100.0)

## 2018-05-30 LAB — TSH: TSH: 2.42 u[IU]/mL (ref 0.450–4.500)

## 2018-05-30 LAB — LIPID PANEL
Chol/HDL Ratio: 3.5 ratio (ref 0.0–4.4)
Cholesterol, Total: 170 mg/dL (ref 100–199)
HDL: 49 mg/dL (ref >39)
LDL Calculated: 108 mg/dL — ABNORMAL HIGH (ref 0–99)
Triglycerides: 65 mg/dL (ref 0–149)
VLDL Cholesterol Cal: 13 mg/dL (ref 5–40)

## 2018-05-30 LAB — HEMOGLOBIN A1C
ESTIMATED AVERAGE GLUCOSE: 120 mg/dL
Hgb A1c MFr Bld: 5.8 % — ABNORMAL HIGH (ref 4.8–5.6)

## 2018-06-05 ENCOUNTER — Ambulatory Visit
Admission: RE | Admit: 2018-06-05 | Discharge: 2018-06-05 | Disposition: A | Discharge status: Home / Self Care | Payer: BLUE CROSS/BLUE SHIELD | Source: Ambulatory Visit | Attending: Obstetrics and Gynecology | Admitting: Obstetrics and Gynecology

## 2018-06-05 ENCOUNTER — Ambulatory Visit: Payer: BLUE CROSS/BLUE SHIELD

## 2018-06-05 ENCOUNTER — Other Ambulatory Visit: Payer: Self-pay | Admitting: Obstetrics and Gynecology

## 2018-06-05 ENCOUNTER — Telehealth: Payer: Self-pay

## 2018-06-05 DIAGNOSIS — N63 Unspecified lump in unspecified breast: Secondary | ICD-10-CM

## 2018-06-05 DIAGNOSIS — R7303 Prediabetes: Secondary | ICD-10-CM

## 2018-06-05 DIAGNOSIS — R928 Other abnormal and inconclusive findings on diagnostic imaging of breast: Secondary | ICD-10-CM

## 2018-06-05 DIAGNOSIS — R922 Inconclusive mammogram: Secondary | ICD-10-CM | POA: Diagnosis not present

## 2018-06-05 NOTE — Telephone Encounter (Signed)
Attempted to reach patient. There was no answer and recording states that the voicemail box has not been set up yet.

## 2018-06-05 NOTE — Telephone Encounter (Signed)
-----   Message from Adriana Dom, MD sent at 06/02/2018  9:10 AM EST ----- Please let the patient know that she has pre-diabetes and set her up for pre-diabetes counseling. Her LDL is minimally elevated. Her vit d is low. She should change to taking 2,000 IU of vit d 3 daily. The rest of her blood work is normal.  CC: Dr Wolfgang Phoenix

## 2018-06-12 NOTE — Telephone Encounter (Signed)
Spoke with patient. Results given. Patient verbalizes understanding. Referral placed to [facility]. Patient is aware she will be contacted directly to schedule an appointment. Encounter closed.

## 2018-06-17 LAB — HM DIABETES EYE EXAM

## 2018-06-25 ENCOUNTER — Encounter: Payer: BLUE CROSS/BLUE SHIELD | Attending: Family Medicine | Admitting: Nutrition

## 2018-06-25 VITALS — Ht 67.0 in | Wt 261.0 lb

## 2018-06-25 DIAGNOSIS — R739 Hyperglycemia, unspecified: Secondary | ICD-10-CM | POA: Diagnosis not present

## 2018-06-25 NOTE — Progress Notes (Signed)
  Medical Nutrition Therapy:  Appt start time: 1330 end time:  1430.   Assessment:  Primary concerns today: Prediabetes and Obesity. BMI 40. LIves by herself. Eats out 50% of time. Sees Dr. Wolfgang Phoenix. A1C 5.8%. Eats 2-3 meals per day. LIkes to graze or nibbles between meals.  Physical actviity. :  ADL. 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  She is willing to work on exercise and better food choices to lose weight and prevent diabetes.  Was going to do weight loss medications but couldn't afford it.   Wt Readings from Last 3 Encounters:  06/25/18 261 lb (118.4 kg)  04/25/18 264 lb (119.7 kg)  03/27/18 266 lb 6.4 oz (120.8 kg)   Ht Readings from Last 3 Encounters:  06/25/18 5\' 7"  (1.702 m)  04/25/18 5\' 6"  (1.676 m)  03/27/18 5' 6.14" (1.68 m)   Body mass index is 40.88 kg/m.   Lab Results  Component Value Date   HGBA1C 5.8 (H) 05/29/2018   CMP Latest Ref Rng & Units 05/29/2018 03/26/2017 02/01/2016  Glucose 65 - 99 mg/dL 89 82 108(H)  BUN 6 - 24 mg/dL 12 9 12   Creatinine 0.57 - 1.00 mg/dL 0.75 0.71 0.68  Sodium 134 - 144 mmol/L 144 143 140  Potassium 3.5 - 5.2 mmol/L 4.1 3.9 3.6  Chloride 96 - 106 mmol/L 102 103 102  CO2 20 - 29 mmol/L 26 23 24   Calcium 8.7 - 10.2 mg/dL 9.1 9.0 9.3  Total Protein 6.0 - 8.5 g/dL 6.6 7.3 7.2  Total Bilirubin 0.0 - 1.2 mg/dL 0.2 0.3 0.4  Alkaline Phos 39 - 117 IU/L 77 77 69  AST 0 - 40 IU/L 17 16 15   ALT 0 - 32 IU/L 15 15 14      Preferred Learning Style:    No preference indicated    Learning Readiness:   Ready  Change in progress   MEDICATIONS:   DIETARY INTAKE:    Eats 2-3 meals per day.   Usual physical activity: ADL  Estimated energy needs: 1200  calories 135 g carbohydrates 90 g protein 33 g fat  Progress Towards Goal(s):  In progress.   Nutritional Diagnosis:  NI-1.5 Excessive energy intake As related to high calorie low nutrient diet.  As evidenced by BMI 40 and eating out most of her meals in fast foods..     Intervention:  Nutrition and  PreDiabetes education provided on My Plate, CHO counting, meal planning, portion sizes, timing of meals, avoiding snacks between meals , signs/symptoms and treatment of hyper/hypoglycemia,  taking medications as prescribed, benefits of exercising 30 minutes per day and prevention of DM. Low Salt Low Fat High Fiber Diet.  Teaching Method Utilized:  Visual Auditory Hands on  Handouts given during visit include:  The Plate Method   Meal Plan Card   Barriers to learning/adherence to lifestyle change: none  Demonstrated degree of understanding via:  Teach Back   Monitoring/Evaluation:  Dietary intake, exercise, meal planning , and body weight in 1 month(s).

## 2018-06-25 NOTE — Patient Instructions (Signed)
Goals 1. Follow My Plate 2/ Walk on breaks at work 3. Eat better balanced meals 4. Cut out snacks between meals Lose 1 lb per week.

## 2018-07-10 ENCOUNTER — Encounter: Payer: Self-pay | Admitting: Nutrition

## 2018-07-10 ENCOUNTER — Encounter: Payer: Self-pay | Admitting: *Deleted

## 2018-09-12 ENCOUNTER — Ambulatory Visit
Admission: RE | Admit: 2018-09-12 | Discharge: 2018-09-12 | Disposition: A | Discharge status: Home / Self Care | Payer: BLUE CROSS/BLUE SHIELD | Source: Ambulatory Visit | Attending: Obstetrics and Gynecology | Admitting: Obstetrics and Gynecology

## 2018-09-12 ENCOUNTER — Other Ambulatory Visit: Payer: Self-pay

## 2018-09-12 DIAGNOSIS — N63 Unspecified lump in unspecified breast: Secondary | ICD-10-CM | POA: Diagnosis not present

## 2018-09-23 ENCOUNTER — Ambulatory Visit: Payer: BLUE CROSS/BLUE SHIELD | Admitting: Nutrition

## 2018-11-06 ENCOUNTER — Ambulatory Visit (INDEPENDENT_AMBULATORY_CARE_PROVIDER_SITE_OTHER): Payer: BLUE CROSS/BLUE SHIELD | Admitting: Family Medicine

## 2018-11-06 ENCOUNTER — Other Ambulatory Visit: Payer: Self-pay

## 2018-11-06 ENCOUNTER — Telehealth: Payer: Self-pay | Admitting: *Deleted

## 2018-11-06 ENCOUNTER — Telehealth: Payer: Self-pay | Admitting: Family Medicine

## 2018-11-06 DIAGNOSIS — R6889 Other general symptoms and signs: Secondary | ICD-10-CM

## 2018-11-06 DIAGNOSIS — J019 Acute sinusitis, unspecified: Secondary | ICD-10-CM

## 2018-11-06 DIAGNOSIS — Z20822 Contact with and (suspected) exposure to covid-19: Secondary | ICD-10-CM

## 2018-11-06 MED ORDER — AMOXICILLIN 500 MG PO TABS: 500.0000 mg | ORAL_TABLET | Freq: Three times a day (TID) | ORAL | 0 refills | Status: DC

## 2018-11-06 MED ORDER — ALBUTEROL SULFATE HFA 108 (90 BASE) MCG/ACT IN AERS: 2.0000 | INHALATION_SPRAY | RESPIRATORY_TRACT | 2 refills | Status: DC | PRN

## 2018-11-06 NOTE — Telephone Encounter (Signed)
Call to patient- appointment scheduled.  

## 2018-11-06 NOTE — Progress Notes (Signed)
None Subjective:  Telephone visit  Patient ID: Adriana [name], female    DOB: Dec 04, 1960, 58 y.o.   MRN: [id number] The patient started off approximately a week ago with a dry cough.  She states at first it was just a dry cough without drainage but then she started having sore throat she also states she started having significant congestion.  She denies sinus pain.  She states the cough is become worse.  She does have occasional sharp pains in her chest.  She also states her joints are aching.  She also relates significant fatigue and tiredness.  She is unaware of her temperature because she does not have a thermometer and could not find one.  She states at times she is having chills.  She also states that she is having some sweats at night.  She also relates significant fatigue and tiredness and low energy.  She has not lost a sense of taste or smell.  She does not have diarrhea.  She states her overall energy level is poor.  Denies wheezing or difficulty breathing with moving from room to room.  Has not been around anyone with history of COVID of it.  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 Cough  This is a new problem. The current episode started in the past 7 days. Associated symptoms include chills, headaches, myalgias, nasal congestion, rhinorrhea and a sore throat. Pertinent negatives include no chest pain, ear pain, fever, shortness of breath or wheezing. Treatments tried: hot tea.    Patient does state if she takes a deep breath she feels like she has to cough  Review of Systems  Constitutional: Positive for chills and fatigue. Negative for activity change and fever.  HENT: Positive for congestion, rhinorrhea and sore throat. Negative for ear pain.   Eyes: Negative for discharge.  Respiratory: Positive for cough. Negative for shortness of breath and wheezing.   Cardiovascular: Negative for chest  pain.  Musculoskeletal: Positive for myalgias.  Neurological: Positive for headaches.  Virtual Visit via Video Note  I connected with Arlen G Haskew on 11/06/18 at 11:30 AM EDT by a video enabled telemedicine application and verified that I am speaking with the correct person using two identifiers.  Location: Patient: home Provider: office   I discussed the limitations of evaluation and management by telemedicine and the availability of in person appointments. The patient expressed understanding and agreed to proceed.  History of Present Illness:    Observations/Objective:   Assessment and Plan:   Follow Up Instructions:    I discussed the assessment and treatment plan with the patient. The patient was provided an opportunity to ask questions and all were answered. The patient agreed with the plan and demonstrated an understanding of the instructions.   The patient was advised to call back or seek an in-person evaluation if the symptoms worsen or if the condition fails to improve as anticipated.  I provided 15 minutes of non-face-to-face time during this encounter.        Objective:   Physical Exam  Today's visit was via telephone Physical exam was not possible for this visit       Assessment & Plan:  This is concerning for the possibility of coronavirus exposure as well as illness given the progressive nature.  I would recommend testing for this individual.  Also if she is positive she definitely does not need to go back to  her workplace which does a lot of transportation of debilitated set of symptoms.  I will go ahead and prescribe amoxicillin for possibility of sinus infection. Patient was encouraged to self isolate until testing is back We will connect with the testing facility to try to help set her up Patient was advised if high fever shortness of breath progressive troubles worse to immediately go to the emergency department.  Addendum we were able to  get her scheduled for a COVID testing. I also spoke with her regarding the warning signs again. Albuterol was sent into use 2 puffs every 4 hours as needed

## 2018-11-06 NOTE — Telephone Encounter (Signed)
Adriana [name] is having symptoms of covid and was seen today by Dr. Sallee Lange. He would like to have covid 19 test done and would like results sent to him. Pt's number is [phone]. Dr. Lance Sell note is available in epic. Please let me know if you need further information. Thanks.

## 2018-11-06 NOTE — Telephone Encounter (Signed)
Adriana [name] is having symptoms of covid and was seen today by Dr. Sallee Lange. He would like to have covid 19 test done and would like results sent to him. Pt's number is [phone]. Dr. Lance Sell note is available in epic. Please let me know if you need further information. Thanks.   Call to patient- patient can not go today- she request appointment tomorrow.

## 2018-11-06 NOTE — Telephone Encounter (Signed)
Please go ahead and do a work note from now through this coming Tuesday- having covid testing today or tomorrow very important to do [place] work excuse

## 2018-11-06 NOTE — Telephone Encounter (Signed)
Patient had a virtual video with Dr. Nicki Reaper this morning and was told to stay out of work until 5/26 and she is needing a work excuse with detail why she is out of work and having the Covin 19 testing done faxed to [phone] attn. Pamala Hurry

## 2018-11-07 ENCOUNTER — Encounter: Payer: Self-pay | Admitting: Family Medicine

## 2018-11-07 ENCOUNTER — Other Ambulatory Visit: Payer: BLUE CROSS/BLUE SHIELD

## 2018-11-07 ENCOUNTER — Other Ambulatory Visit: Payer: Self-pay | Admitting: Family Medicine

## 2018-11-07 DIAGNOSIS — R6889 Other general symptoms and signs: Secondary | ICD-10-CM | POA: Diagnosis not present

## 2018-11-07 NOTE — Telephone Encounter (Signed)
Work note was faxed over today 11/07/2018

## 2018-11-12 LAB — NOVEL CORONAVIRUS, NAA: SARS-CoV-2, NAA: NOT DETECTED

## 2019-02-12 ENCOUNTER — Encounter (HOSPITAL_COMMUNITY): Payer: Self-pay | Admitting: Emergency Medicine

## 2019-02-12 ENCOUNTER — Emergency Department (HOSPITAL_COMMUNITY)
Admission: EM | Admit: 2019-02-12 | Discharge: 2019-02-12 | Disposition: A | Discharge status: Home / Self Care | Payer: BLUE CROSS/BLUE SHIELD | Attending: Emergency Medicine | Admitting: Emergency Medicine

## 2019-02-12 ENCOUNTER — Emergency Department (HOSPITAL_COMMUNITY): Payer: BLUE CROSS/BLUE SHIELD

## 2019-02-12 ENCOUNTER — Other Ambulatory Visit: Payer: Self-pay

## 2019-02-12 DIAGNOSIS — R0602 Shortness of breath: Secondary | ICD-10-CM | POA: Diagnosis not present

## 2019-02-12 DIAGNOSIS — R0789 Other chest pain: Secondary | ICD-10-CM | POA: Diagnosis not present

## 2019-02-12 DIAGNOSIS — R079 Chest pain, unspecified: Secondary | ICD-10-CM | POA: Diagnosis not present

## 2019-02-12 DIAGNOSIS — Z79899 Other long term (current) drug therapy: Secondary | ICD-10-CM | POA: Diagnosis not present

## 2019-02-12 LAB — BASIC METABOLIC PANEL
Anion gap: 8 (ref 5–15)
BUN: 7 mg/dL (ref 6–20)
CO2: 26 mmol/L (ref 22–32)
Calcium: 9.2 mg/dL (ref 8.9–10.3)
Chloride: 107 mmol/L (ref 98–111)
Creatinine, Ser: 0.64 mg/dL (ref 0.44–1.00)
GFR calc Af Amer: >60 mL/min (ref >60)
GFR calc non Af Amer: >60 mL/min (ref >60)
Glucose, Bld: 108 mg/dL — ABNORMAL HIGH (ref 70–99)
Potassium: 3.8 mmol/L (ref 3.5–5.1)
Sodium: 141 mmol/L (ref 135–145)

## 2019-02-12 LAB — CBC
HCT: 39 % (ref 36.0–46.0)
Hemoglobin: 12.3 g/dL (ref 12.0–15.0)
MCH: 30 pg (ref 26.0–34.0)
MCHC: 31.5 g/dL (ref 30.0–36.0)
MCV: 95.1 fL (ref 80.0–100.0)
Platelets: 209 10*3/uL (ref 150–400)
RBC: 4.1 MIL/uL (ref 3.87–5.11)
RDW: 13.3 % (ref 11.5–15.5)
WBC: 4.4 10*3/uL (ref 4.0–10.5)
nRBC: 0 % (ref 0.0–0.2)

## 2019-02-12 LAB — TROPONIN I (HIGH SENSITIVITY)
Troponin I (High Sensitivity): <2 ng/L (ref <18)
Troponin I (High Sensitivity): <2 ng/L (ref <18)

## 2019-02-12 MED ORDER — IBUPROFEN 400 MG PO TABS: 400.0000 mg | ORAL_TABLET | Freq: Four times a day (QID) | ORAL | 0 refills | Status: DC | PRN

## 2019-02-12 MED ORDER — IBUPROFEN 800 MG PO TABS
800.0000 mg | ORAL_TABLET | Freq: Once | ORAL | Status: AC
  Administered 2019-02-12: 800 mg via ORAL
  Filled 2019-02-12: qty 1

## 2019-02-12 MED ORDER — SODIUM CHLORIDE 0.9% FLUSH: 3.0000 mL | Freq: Once | INTRAVENOUS | Status: DC

## 2019-02-12 NOTE — ED Triage Notes (Signed)
Patient reports left sided chest pain that started during her mother's funeral this am. Denies dizziness or lightheadedness. No nausea or emesis.

## 2019-02-12 NOTE — ED Provider Notes (Signed)
[facility] EMERGENCY DEPARTMENT Provider Note   CSN: [id number] Arrival date & time: 02/12/19  1129     History   Chief Complaint Chief Complaint  Patient presents with  . Chest Pain    HPI Adriana [name] is a 58 y.o. female.     HPI  This patient is a pleasant 59 year old female who has a known history of acid reflux, migraine headaches, anxiety and depression.  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  This morning while she was at home getting ready to go to the funeral of her mother who recently passed she started to develop left-sided chest pain which was a sharp stabbing pain to the upper left chest and lateral chest worse with deep breathing, mild persistent but worse with breathing and coughing.  She denies any significant cough or fever, denies any swelling of the legs, denies feeling particularly acutely short of breath though she does state that she has been feeling short of breath for years.  Because of the shortness of breath the patient has actually undergone multiple tests including an echocardiogram and was told that a nuclear stress test may be advisable but she declined because she did not want "that stuff going through her veins"  At this time the patient symptoms are mild but persistent, she has been in the waiting room for a couple of hours and during that time had a negative troponin and EKG.  She denies recent travel, trauma, immobilization, surgery, estrogen use, tobacco use, history of cancer or history of thromboembolism.  Past Medical History:  Diagnosis Date  . Anemia    history  . Anxiety   . Benign positional vertigo 12/19/13  . Depression   . Dyspnea    with exertion  . Edema   . Endometrial polyp   . Fibroids   . GERD (gastroesophageal reflux disease)   . Insomnia   . Internal and external hemorrhoids without  complication   . Migraine   . Morbid obesity ([place])   . Plantar fasciitis   . PMB (postmenopausal bleeding)   . Rectal bleeding   . Reflux     Patient Active Problem List   Diagnosis Date Noted  . Morbid obesity ([place]) 05/18/2017  . Anxiety and depression 04/04/2017  . Depression 11/15/2013  . Rectal bleeding 09/01/2013  . Benign paroxysmal positional vertigo 12/25/2012  . HEMORRHOIDS, INTERNAL 08/22/2009  . GERD 08/22/2009  . DYSPHAGIA UNSPECIFIED 08/22/2009  . EDEMA 12/03/2008  . DYSPNEA 12/03/2008    Past Surgical History:  Procedure Laterality Date  . DILATATION & CURETTAGE/HYSTEROSCOPY WITH MYOSURE N/A 07/23/2017   Procedure: DILATATION & CURETTAGE/HYSTEROSCOPY WITH MYOSURE;  Surgeon: Salvadore Dom, MD;  Location: [facility];  Service: Gynecology;  Laterality: N/A;  endometrial polyp  . DILATION AND CURETTAGE OF UTERUS    . HYSTEROSCOPY    . None       OB History    Gravida  2   Para  1   Term  1   Preterm  0   AB  1   Living  1     SAB  1   TAB  0   Ectopic  0   Multiple  0   Live Births  1            Home Medications  Prior to Admission medications   Medication Sig Start Date End Date Taking? Authorizing Provider  albuterol (VENTOLIN HFA) 108 (90 Base) MCG/ACT inhaler Inhale 2 puffs into the lungs every 4 (four) hours as needed for wheezing. 11/06/18   Kathyrn Drown, MD  amoxicillin (AMOXIL) 500 MG tablet Take 1 tablet (500 mg total) by mouth 3 (three) times daily. 11/06/18   Kathyrn Drown, MD  cholecalciferol (VITAMIN D) 1000 UNITS tablet Take 1,000 Units by mouth 3 (three) times a week.     [provider]  clonazePAM (KLONOPIN) 0.5 MG tablet Take 1/2 - 1 po BID prn anxiety Patient not taking: Reported on 06/25/2018 06/14/17   Nilda Simmer, NP  ibuprofen (ADVIL) 400 MG tablet Take 1 tablet (400 mg total) by mouth every 6 (six) hours as needed. 02/12/19   Noemi Chapel, MD  Magnesium 400 MG TABS Take 1  tablet by mouth as needed.    [provider]  metoCLOPramide (REGLAN) 5 MG tablet Take 1 tablet by mouth 30 minutes before each prep dose and can take up to 3 times daily as needed Patient not taking: Reported on 06/25/2018 04/15/18   Mauri Pole, MD  Multiple Vitamins-Minerals (EYE VITAMINS PO) Take 1 tablet by mouth as needed.    [provider]  omeprazole (PRILOSEC) 40 MG capsule Take 1 capsule (40 mg total) by mouth 2 (two) times daily. Patient not taking: Reported on 06/25/2018 04/25/18   Mauri Pole, MD  phentermine (ADIPEX-P) 37.5 MG tablet Take 37.5 mg by mouth daily before breakfast.    [provider]    Family History Family History  Problem Relation Age of Onset  . Colon cancer Father        diagnosed in late 63s.   . Hypertension Father   . Heart attack Father   . Esophageal cancer Neg Hx   . Breast cancer Neg Hx     Social History Social History   Tobacco Use  . Smoking status: Never Smoker  . Smokeless tobacco: Never Used  Substance Use Topics  . Alcohol use: Yes    Alcohol/week: 0.0 standard drinks    Comment: 1 a month  . Drug use: No     Allergies   Hydrocodone   Review of Systems Review of Systems  All other systems reviewed and are negative.    Physical Exam Updated Vital Signs BP (!) 146/80   Pulse 61   Temp 97.9 F (36.6 C) (Oral)   Resp 17   LMP 01/16/2014   SpO2 100%   Physical Exam Vitals signs and nursing note reviewed.  Constitutional:      General: She is not in acute distress.    Appearance: She is well-developed.  HENT:     Head: Normocephalic and atraumatic.     Mouth/Throat:     Pharynx: No oropharyngeal exudate.  Eyes:     General: No scleral icterus.       Right eye: No discharge.        Left eye: No discharge.     Conjunctiva/sclera: Conjunctivae normal.     Pupils: Pupils are equal, round, and reactive to light.  Neck:     Musculoskeletal: Normal range of motion and neck  supple.     Thyroid: No thyromegaly.     Vascular: No JVD.  Cardiovascular:     Rate and Rhythm: Normal rate and regular rhythm.     Heart sounds: Normal heart sounds. No murmur.  No friction rub. No gallop.   Pulmonary:     Effort: Pulmonary effort is normal. No respiratory distress.     Breath sounds: Normal breath sounds. No wheezing or rales.  Abdominal:     General: Bowel sounds are normal. There is no distension.     Palpations: Abdomen is soft. There is no mass.     Tenderness: There is no abdominal tenderness.  Musculoskeletal: Normal range of motion.        General: No tenderness.  Lymphadenopathy:     Cervical: No cervical adenopathy.  Skin:    General: Skin is warm and dry.     Findings: No erythema or rash.  Neurological:     Mental Status: She is alert.     Coordination: Coordination normal.  Psychiatric:        Behavior: Behavior normal.      ED Treatments / Results  Labs (all labs ordered are listed, but only abnormal results are displayed) Labs Reviewed  BASIC METABOLIC PANEL - Abnormal; Notable for the following components:      Result Value   Glucose, Bld 108 (*)    All other components within normal limits  CBC  TROPONIN I (HIGH SENSITIVITY)  TROPONIN I (HIGH SENSITIVITY)    EKG  ED ECG REPORT  I personally interpreted this EKG   Date: 02/12/2019 15:41 PM  Rate: 56   Rhythm: normal sinus rhythm  QRS Axis: normal  Intervals: normal  ST/T Wave abnormalities: normal  Conduction Disutrbances:none  Narrative Interpretation:   Old EKG Reviewed: unchanged Low voltage EKG  Radiology Dg Chest 2 View  Result Date: 02/12/2019 CLINICAL DATA:  Chest pain, chronic shortness of breath EXAM: CHEST - 2 VIEW COMPARISON:  07/14/2009 FINDINGS: The heart size and mediastinal contours are within normal limits. Both lungs are clear. The visualized skeletal structures are unremarkable. IMPRESSION: No acute abnormality of the lungs. Electronically Signed   By: Eddie Candle M.D.   On: 02/12/2019 12:01    Procedures Procedures (including critical care time)  Medications Ordered in ED Medications  ibuprofen (ADVIL) tablet 800 mg (800 mg Oral Given 02/12/19 1549)     Initial Impression / Assessment and Plan / ED Course  I have reviewed the triage vital signs and the nursing notes.  Pertinent labs & imaging results that were available during my care of the patient were reviewed by me and considered in my medical decision making (see chart for details).  Clinical Course as of Feb 12 1608  Thu Feb 12, 2019  1607 The patient second troponin is normal, her metabolic panel and CBC are also unremarkable as are her vital signs.  The patient has been given reassurance that her symptoms are unlikely related to a severe or life-threatening cause, she is stable for discharge and agreeable to the plan for follow-up.   [BM]    Clinical Course User Index [BM] Noemi Chapel, MD       The patient is slightly anxious appearing, she has what appears to be pleuritic chest pain, she has a heart rate of 56, oxygen of 100% in the room, she is afebrile and slightly hypertensive.  This does not fit the picture of a pulmonary embolism.  The patient reports that she has been feeling more anxiety lately given her multiple recent life events including the loss of her mother.She does not have exertional chest pain, she does not have any asymmetry of her legs to suggest DVT.  A repeat EKG is  also obtained and appears unremarkable.  Second troponin is pending, anticipate discharge.  The x-ray does not reveal any signs of pneumothorax or abnormal mediastinum.  I described to the patient the things that we were worried about, the indications for follow-up and as long as her troponin is normal I think she can do that in the outpatient setting.  She is agreeable to the plan.  She will be given ibuprofen    Final Clinical Impressions(s) / ED Diagnoses   Final diagnoses:   Left-sided chest pain    ED Discharge Orders         Ordered    ibuprofen (ADVIL) 400 MG tablet  Every 6 hours PRN     02/12/19 1608           Noemi Chapel, MD 02/12/19 1610

## 2019-02-12 NOTE — Discharge Instructions (Signed)
Your testing today did not reveal any abnormal findings.  Thankfully your blood work was normal, no signs of heart attack, your x-ray and EKG were also unremarkable.  Please seek medical exam for increasing pain, difficulty breathing, nausea, severe sweating or any other severe or worsening symptoms.  I would recommend that you do take a baby aspirin daily.  Please see the phone number above for the cardiology service, call for the next fillable appointment, you should be seen within the next week, hopefully 3 days.

## 2019-03-30 ENCOUNTER — Ambulatory Visit: Payer: BLUE CROSS/BLUE SHIELD | Admitting: Obstetrics and Gynecology

## 2019-10-26 ENCOUNTER — Telehealth: Payer: Self-pay

## 2019-10-26 NOTE — Telephone Encounter (Signed)
Patient is calling in regards to having discharge, patient states it has a color to it with an odor. Patient also states she is experiencing burning.

## 2019-10-26 NOTE — Telephone Encounter (Signed)
AEX 03/2018 PMP, no HRT  Spoke with patient. Patient states having vaginal burning, irritation, discharge and odor x 1 week. Pt denies any fever, chills, back pain or UTI sx. Pt denies using any OTC meds, but has been using vinegar via vagina for last 2-3 days. Sx not improved or resolved.  Pt advised to be seen for further evaluation. Pt agreeable. Pt scheduled with Dr Talbert Nan at 3:30 pm on 10/27/19. Pt agreeable and verbalized understanding to date and time. CPS neg.   Routing to Dr Talbert Nan for review. Encounter closed.

## 2019-10-27 ENCOUNTER — Other Ambulatory Visit: Payer: Self-pay

## 2019-10-27 ENCOUNTER — Ambulatory Visit: Payer: 59 | Admitting: Obstetrics and Gynecology

## 2019-10-27 ENCOUNTER — Encounter: Payer: Self-pay | Admitting: Obstetrics and Gynecology

## 2019-10-27 VITALS — BP 110/64 | HR 87 | Temp 97.3°F | Ht 66.5 in | Wt 258.0 lb

## 2019-10-27 DIAGNOSIS — Z113 Encounter for screening for infections with a predominantly sexual mode of transmission: Secondary | ICD-10-CM

## 2019-10-27 DIAGNOSIS — N760 Acute vaginitis: Secondary | ICD-10-CM | POA: Diagnosis not present

## 2019-10-27 DIAGNOSIS — B9689 Other specified bacterial agents as the cause of diseases classified elsewhere: Secondary | ICD-10-CM

## 2019-10-27 MED ORDER — METRONIDAZOLE 0.75 % VA GEL: 1.0000 | Freq: Every day | VAGINAL | 0 refills | Status: DC

## 2019-10-27 NOTE — Patient Instructions (Addendum)
Therapist: Rogene Houston [phone]  Vaginitis Vaginitis is a condition in which the vaginal tissue swells and becomes red (inflamed). This condition is most often caused by a change in the normal balance of bacteria and yeast that live in the vagina. This change causes an overgrowth of certain bacteria or yeast, which causes the inflammation. There are different types of vaginitis, but the most common types are:  Bacterial vaginosis.  Yeast infection (candidiasis).  Trichomoniasis vaginitis. This is a sexually transmitted disease (STD).  Viral vaginitis.  Atrophic vaginitis.  Allergic vaginitis. What are the causes? The cause of this condition depends on the type of vaginitis. It can be caused by:  Bacteria (bacterial vaginosis).  Yeast, which is a fungus (yeast infection).  A parasite (trichomoniasis vaginitis).  A virus (viral vaginitis).  Low hormone levels (atrophic vaginitis). Low hormone levels can occur during pregnancy, breastfeeding, or after menopause.  Irritants, such as bubble baths, scented tampons, and feminine sprays (allergic vaginitis). Other factors can change the normal balance of the yeast and bacteria that live in the vagina. These include:  Antibiotic medicines.  Poor hygiene.  Diaphragms, vaginal sponges, spermicides, birth control pills, and intrauterine devices (IUD).  Sex.  Infection.  Uncontrolled diabetes.  A weakened defense (immune) system. What increases the risk? This condition is more likely to develop in women who:  Smoke.  Use vaginal douches, scented tampons, or scented sanitary pads.  Wear tight-fitting pants.  Wear thong underwear.  Use oral birth control pills or an IUD.  Have sex without a condom.  Have multiple sex partners.  Have an STD.  Frequently use the spermicide nonoxynol-9.  Eat lots of foods high in sugar.  Have uncontrolled diabetes.  Have low estrogen levels.  Have a weakened immune system  from an immune disorder or medical treatment.  Are pregnant or breastfeeding. What are the signs or symptoms? Symptoms vary depending on the cause of the vaginitis. Common symptoms include:  Abnormal vaginal discharge. ? The discharge is white, gray, or yellow with bacterial vaginosis. ? The discharge is thick, white, and cheesy with a yeast infection. ? The discharge is frothy and yellow or greenish with trichomoniasis.  A bad vaginal smell. The smell is fishy with bacterial vaginosis.  Vaginal itching, pain, or swelling.  Sex that is painful.  Pain or burning when urinating. Sometimes there are no symptoms. How is this diagnosed? This condition is diagnosed based on your symptoms and medical history. A physical exam, including a pelvic exam, will also be done. You may also have other tests, including:  Tests to determine the pH level (acidity or alkalinity) of your vagina.  A whiff test, to assess the odor that results when a sample of your vaginal discharge is mixed with a potassium hydroxide solution.  Tests of vaginal fluid. A sample will be examined under a microscope. How is this treated? Treatment varies depending on the type of vaginitis you have. Your treatment may include:  Antibiotic creams or pills to treat bacterial vaginosis and trichomoniasis.  Antifungal medicines, such as vaginal creams or suppositories, to treat a yeast infection.  Medicine to ease discomfort if you have viral vaginitis. Your sexual partner should also be treated.  Estrogen delivered in a cream, pill, suppository, or vaginal ring to treat atrophic vaginitis. If vaginal dryness occurs, lubricants and moisturizing creams may help. You may need to avoid scented soaps, sprays, or douches.  Stopping use of a product that is causing allergic vaginitis. Then using a vaginal cream  to treat the symptoms. Follow these instructions at home: Lifestyle  Keep your genital area clean and dry. Avoid soap,  and only rinse the area with water.  Do not douche or use tampons until your health care provider says it is okay to do so. Use sanitary pads, if needed.  Do not have sex until your health care provider approves. When you can return to sex, practice safe sex and use condoms.  Wipe from front to back. This avoids the spread of bacteria from the rectum to the vagina. General instructions  Take over-the-counter and prescription medicines only as told by your health care provider.  If you were prescribed an antibiotic medicine, take or use it as told by your health care provider. Do not stop taking or using the antibiotic even if you start to feel better.  Keep all follow-up visits as told by your health care provider. This is important. How is this prevented?  Use mild, non-scented products. Do not use things that can irritate the vagina, such as fabric softeners. Avoid the following products if they are scented: ? Feminine sprays. ? Detergents. ? Tampons. ? Feminine hygiene products. ? Soaps or bubble baths.  Let air reach your genital area. ? Wear cotton underwear to reduce moisture buildup. ? Avoid wearing underwear while you sleep. ? Avoid wearing tight pants and underwear or nylons without a cotton panel. ? Avoid wearing thong underwear.  Take off any wet clothing, such as bathing suits, as soon as possible.  Practice safe sex and use condoms. Contact a health care provider if:  You have abdominal pain.  You have a fever.  You have symptoms that last for more than 2-3 days. Get help right away if:  You have a fever and your symptoms suddenly get worse. Summary  Vaginitis is a condition in which the vaginal tissue becomes inflamed.This condition is most often caused by a change in the normal balance of bacteria and yeast that live in the vagina.  Treatment varies depending on the type of vaginitis you have.  Do not douche, use tampons , or have sex until your health  care provider approves. When you can return to sex, practice safe sex and use condoms. This information is not intended to replace advice given to you by your health care provider. Make sure you discuss any questions you have with your health care provider. Document Revised: 05/17/2017 Document Reviewed: 07/10/2016 Elsevier Patient Education  2020 [employer].

## 2019-10-27 NOTE — Progress Notes (Signed)
GYNECOLOGY  VISIT   HPI: 59 y.o.   Divorced White or Caucasian Not Hispanic or Latino  female   [phone] with Patient's last menstrual period was 01/16/2014.   here for vaginal discharge, odor and itching. Patient states that her symptoms started a week ago. She states that she has recently switched soaps.  The discharge is a yellow green, watery, mild itching, more burning, + odor. She is back with her ex, he is not faithful (he is fairly up front about it). He has schizophrenia. Not using condoms. He thinks she is cheating but she isn't.   GYNECOLOGIC HISTORY: Patient's last menstrual period was 01/16/2014. Contraception:none  Menopausal hormone therapy: none        OB History    Gravida  2   Para  1   Term  1   Preterm  0   AB  1   Living  1     SAB  1   TAB  0   Ectopic  0   Multiple  0   Live Births  1              Patient Active Problem List   Diagnosis Date Noted  . Morbid obesity ([place]) 05/18/2017  . Anxiety and depression 04/04/2017  . Depression 11/15/2013  . Rectal bleeding 09/01/2013  . Benign paroxysmal positional vertigo 12/25/2012  . HEMORRHOIDS, INTERNAL 08/22/2009  . GERD 08/22/2009  . DYSPHAGIA UNSPECIFIED 08/22/2009  . EDEMA 12/03/2008  . DYSPNEA 12/03/2008    Past Medical History:  Diagnosis Date  . Anemia    history  . Anxiety   . Benign positional vertigo 12/19/13  . Depression   . Dyspnea    with exertion  . Edema   . Endometrial polyp   . Fibroids   . GERD (gastroesophageal reflux disease)   . Insomnia   . Internal and external hemorrhoids without complication   . Migraine   . Morbid obesity ([place])   . Plantar fasciitis   . PMB (postmenopausal bleeding)   . Rectal bleeding   . Reflux     Past Surgical History:  Procedure Laterality Date  . DILATATION & CURETTAGE/HYSTEROSCOPY WITH MYOSURE N/A 07/23/2017   Procedure: DILATATION & CURETTAGE/HYSTEROSCOPY WITH MYOSURE;  Surgeon: Salvadore Dom, MD;  Location: [facility];  Service: Gynecology;  Laterality: N/A;  endometrial polyp  . DILATION AND CURETTAGE OF UTERUS    . HYSTEROSCOPY    . None      Current Outpatient Medications  Medication Sig Dispense Refill  . albuterol (VENTOLIN HFA) 108 (90 Base) MCG/ACT inhaler Inhale 2 puffs into the lungs every 4 (four) hours as needed for wheezing. 1 Inhaler 2  . cholecalciferol (VITAMIN D) 1000 UNITS tablet Take 1,000 Units by mouth 3 (three) times a week.     Marland Kitchen ibuprofen (ADVIL) 400 MG tablet Take 1 tablet (400 mg total) by mouth every 6 (six) hours as needed. 30 tablet 0  . Magnesium 400 MG TABS Take 1 tablet by mouth as needed.     Current Facility-Administered Medications  Medication Dose Route Frequency Provider Last Rate Last Admin  . 0.9 %  sodium chloride infusion  500 mL Intravenous Once Nandigam, Venia Minks, MD         ALLERGIES: Hydrocodone  Family History  Problem Relation Age of Onset  . Colon cancer Father        diagnosed in late 76s.   . Hypertension Father   . Heart  attack Father   . Esophageal cancer Neg Hx   . Breast cancer Neg Hx     Social History   Socioeconomic History  . Marital status: Divorced    Spouse name: Not on file  . Number of children: 1  . Years of education: Not on file  . Highest education level: Not on file  Occupational History  . Occupation: Counsellor: Dance movement psychotherapist  Tobacco Use  . Smoking status: Never Smoker  . Smokeless tobacco: Never Used  Substance and Sexual Activity  . Alcohol use: Yes    Alcohol/week: 0.0 standard drinks    Comment: 1 a month  . Drug use: No  . Sexual activity: Not Currently    Partners: Male    Birth control/protection: Post-menopausal  Other Topics Concern  . Not on file  Social History Narrative  . Not on file   Social Determinants of Health   Financial Resource Strain:   . Difficulty of Paying Living Expenses:   Food Insecurity:   . Worried About Sales executive in the Last Year:   . Arboriculturist in the Last Year:   Transportation Needs:   . Film/video editor (Medical):   Marland Kitchen Lack of Transportation (Non-Medical):   Physical Activity:   . Days of Exercise per Week:   . Minutes of Exercise per Session:   Stress:   . Feeling of Stress :   Social Connections:   . Frequency of Communication with Friends and Family:   . Frequency of Social Gatherings with Friends and Family:   . Attends Religious Services:   . Active Member of Clubs or Organizations:   . Attends Archivist Meetings:   Marland Kitchen Marital Status:   Intimate Partner Violence:   . Fear of Current or Ex-Partner:   . Emotionally Abused:   Marland Kitchen Physically Abused:   . Sexually Abused:     Review of Systems  Genitourinary:       Vaginal itching Vaginal burning  Vaginal discharge   All other systems reviewed and are negative.   PHYSICAL EXAMINATION:    BP 110/64   Pulse 87   Temp (!) 97.3 F (36.3 C)   Ht 5' 6.5" (1.689 m)   Wt 258 lb (117 kg)   LMP 01/16/2014   SpO2 97%   BMI 41.02 kg/m     General appearance: alert, cooperative and appears stated age   Pelvic: External genitalia:  no lesions              Urethra:  normal appearing urethra with no masses, tenderness or lesions              Bartholins and Skenes: normal                 Vagina: normal appearing vagina with an increase in watery, white, thin vaginal d/c with bubbles              Cervix: no lesions                Chaperone was present for exam.  ASSESSMENT Bacterial vaginitis Risk of STD, partner hasn't been faithful Increase stress    PLAN Treat with Screening STD Name of counselor given   An After Visit Summary was printed and given to the patient.

## 2019-10-28 ENCOUNTER — Telehealth: Payer: Self-pay

## 2019-10-28 LAB — RPR: RPR Ser Ql: NONREACTIVE

## 2019-10-28 LAB — HIV ANTIBODY (ROUTINE TESTING W REFLEX): HIV Screen 4th Generation wRfx: NONREACTIVE

## 2019-10-28 NOTE — Telephone Encounter (Signed)
Left message for call back to go over lab results.

## 2019-10-29 LAB — CHLAMYDIA/GONOCOCCUS/TRICHOMONAS, NAA
Chlamydia by NAA: NEGATIVE
Gonococcus by NAA: NEGATIVE
Trich vag by NAA: NEGATIVE

## 2019-10-29 NOTE — Telephone Encounter (Signed)
Spoke to pt. Results given. See phone encounter dated 5/12. Pt aware about cervical cultures and will be called when they are resulted. Pt agreeable.   Encounter closed

## 2019-10-29 NOTE — Telephone Encounter (Signed)
Patient is returning call.  °

## 2019-11-11 NOTE — Progress Notes (Signed)
59 y.o. G27P1011 Divorced White or Caucasian Not Hispanic or Latino female here for annual exam. Patient states that she would like an A1C. She has had 3 tick bites in the last week. She also states that her ankles have been swelling more.  No vaginal bleeding. Normal bowel and bladder function.     Earlier this month she was treated for BV and had negative STD testing. She hasn't seen her partner since then, trying to stay away. Name of counselor given.  She felt slight vulvar irritation yesterday, minimal today   Patient's last menstrual period was 01/16/2014.          Sexually active: No.  The current method of family planning is post menopausal status.    Exercising: Yes.    push mows Smoker:  no  Health Maintenance: Pap:  03/26/17 normal HPV Neg , 01/26/15 neg. HR HPV:neg History of abnormal Pap:  Years ago, had a colposcopy MMG:  09/12/18 Density C Bi-rads 1 neg  BMD:   Never  Colonoscopy: 04/25/17 polyps f/u 3 years TDaP:  03/26/17   reports that she has never smoked. She has never used smokeless tobacco. She reports current alcohol use. She reports that she does not use drugs. 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 Anemia    history  . Anxiety   . Benign positional vertigo 12/19/13  . Depression   . Dyspnea    with exertion  . Edema   . Endometrial polyp   . Fibroids   . GERD (gastroesophageal reflux disease)   . Insomnia   . Internal and external hemorrhoids without complication   . Migraine   . Morbid obesity ([place])   . Plantar fasciitis   . PMB (postmenopausal bleeding)   . Rectal bleeding   . Reflux     Past Surgical History:  Procedure Laterality Date  . DILATATION & CURETTAGE/HYSTEROSCOPY WITH MYOSURE N/A 07/23/2017   Procedure: DILATATION & CURETTAGE/HYSTEROSCOPY WITH MYOSURE;  Surgeon: Salvadore Dom, MD;  Location: [facility];  Service: Gynecology;  Laterality: N/A;   endometrial polyp  . DILATION AND CURETTAGE OF UTERUS    . HYSTEROSCOPY    . None      Current Outpatient Medications  Medication Sig Dispense Refill  . albuterol (VENTOLIN HFA) 108 (90 Base) MCG/ACT inhaler Inhale 2 puffs into the lungs every 4 (four) hours as needed for wheezing. 1 Inhaler 2  . cholecalciferol (VITAMIN D) 1000 UNITS tablet Take 1,000 Units by mouth 3 (three) times a week.     Marland Kitchen ibuprofen (ADVIL) 400 MG tablet Take 1 tablet (400 mg total) by mouth every 6 (six) hours as needed. 30 tablet 0  . Magnesium 400 MG TABS Take 1 tablet by mouth as needed.    . metroNIDAZOLE (METROGEL) 0.75 % vaginal gel Place 1 Applicatorful vaginally at bedtime. Use nightly for 5 nights. 70 g 0   Current Facility-Administered Medications  Medication Dose Route Frequency Provider Last Rate Last Admin  . 0.9 %  sodium chloride infusion  500 mL Intravenous Once Nandigam, Venia Minks, MD        Family History  Problem Relation Age of Onset  . Colon cancer Father        diagnosed in late 46s.   . Hypertension Father   . Heart attack Father   . Esophageal cancer Neg Hx   . Breast cancer Neg Hx  Review of Systems  Cardiovascular: Positive for leg swelling.  All other systems reviewed and are negative.   Exam:   BP 124/62   Pulse 83   Temp (!) 97.3 F (36.3 C)   Ht 5' 6.5" (1.689 m)   Wt 260 lb (117.9 kg)   LMP 01/16/2014   SpO2 97%   BMI 41.34 kg/m   Weight change: @WEIGHTCHANGE @ Height:   Height: 5' 6.5" (168.9 cm)  Ht Readings from Last 3 Encounters:  11/13/19 5' 6.5" (1.689 m)  10/27/19 5' 6.5" (1.689 m)  06/25/18 5\' 7"  (1.702 m)    General appearance: alert, cooperative and appears stated age Head: Normocephalic, without obvious abnormality, atraumatic Neck: no adenopathy, supple, symmetrical, trachea midline and thyroid normal to inspection and palpation Lungs: clear to auscultation bilaterally Cardiovascular: regular rate and rhythm Breasts: normal appearance, no  masses or tenderness Abdomen: soft, non-tender; non distended,  no masses,  no organomegaly Extremities: extremities normal, atraumatic, no cyanosis or edema Skin: Skin color, texture, turgor normal. No rashes or lesions Lymph nodes: Cervical, supraclavicular, and axillary nodes normal. No abnormal inguinal nodes palpated Neurologic: Grossly normal   Pelvic: External genitalia:  no lesions, mild erythema              Urethra:  normal appearing urethra with no masses, tenderness or lesions              Bartholins and Skenes: normal                 Vagina: normal appearing vagina with normal color and discharge, no lesions              Cervix: no lesions               Bimanual Exam:  Uterus:  normal size, contour, position, consistency, mobility, non-tender              Adnexa: no mass, fullness, tenderness               Rectovaginal: Confirms               Anus:  normal sphincter tone, no lesions  Gae Dry chaperoned for the exam.  A:  Well Woman with normal exam  Vulvar irritation, just completed treatment for BV  P:   No pap this year  Discussed breast self exam  Discussed calcium and vit D intake  Mammogram due  Colonoscopy in 11/21  Nuswab for BV and yeast

## 2019-11-12 ENCOUNTER — Other Ambulatory Visit: Payer: Self-pay

## 2019-11-13 ENCOUNTER — Ambulatory Visit (INDEPENDENT_AMBULATORY_CARE_PROVIDER_SITE_OTHER): Payer: 59 | Admitting: Obstetrics and Gynecology

## 2019-11-13 ENCOUNTER — Encounter: Payer: Self-pay | Admitting: Obstetrics and Gynecology

## 2019-11-13 VITALS — BP 124/62 | HR 83 | Temp 97.3°F | Ht 66.5 in | Wt 260.0 lb

## 2019-11-13 DIAGNOSIS — Z01419 Encounter for gynecological examination (general) (routine) without abnormal findings: Secondary | ICD-10-CM

## 2019-11-13 DIAGNOSIS — E559 Vitamin D deficiency, unspecified: Secondary | ICD-10-CM

## 2019-11-13 DIAGNOSIS — N763 Subacute and chronic vulvitis: Secondary | ICD-10-CM

## 2019-11-13 DIAGNOSIS — Z0000 Encounter for general adult medical examination without abnormal findings: Secondary | ICD-10-CM | POA: Diagnosis not present

## 2019-11-13 DIAGNOSIS — R7303 Prediabetes: Secondary | ICD-10-CM

## 2019-11-13 DIAGNOSIS — Z6841 Body Mass Index (BMI) 40.0 and over, adult: Secondary | ICD-10-CM

## 2019-11-13 NOTE — Patient Instructions (Addendum)
Counselor: Rogene Houston [phone]  EXERCISE AND DIET:  We recommended that you start or continue a regular exercise program for good health. Regular exercise means any activity that makes your heart beat faster and makes you sweat.  We recommend exercising at least 30 minutes per day at least 3 days a week, preferably 4 or 5.  We also recommend a diet low in fat and sugar.  Inactivity, poor dietary choices and obesity can cause diabetes, heart attack, stroke, and kidney damage, among others.    ALCOHOL AND SMOKING:  Women should limit their alcohol intake to no more than 7 drinks/beers/glasses of wine (combined, not each!) per week. Moderation of alcohol intake to this level decreases your risk of breast cancer and liver damage. And of course, no recreational drugs are part of a healthy lifestyle.  And absolutely no smoking or even second hand smoke. Most people know smoking can cause heart and lung diseases, but did you know it also contributes to weakening of your bones? Aging of your skin?  Yellowing of your teeth and nails?  CALCIUM AND VITAMIN D:  Adequate intake of calcium and Vitamin D are recommended.  The recommendations for exact amounts of these supplements seem to change often, but generally speaking 1,200 mg of calcium (between diet and supplement) and 800 units of Vitamin D per day seems prudent. Certain women may benefit from higher intake of Vitamin D.  If you are among these women, your doctor will have told you during your visit.    PAP SMEARS:  Pap smears, to check for cervical cancer or precancers,  have traditionally been done yearly, although recent scientific advances have shown that most women can have pap smears less often.  However, every woman still should have a physical exam from her gynecologist every year. It will include a breast check, inspection of the vulva and vagina to check for abnormal growths or skin changes, a visual exam of the cervix, and then an exam to  evaluate the size and shape of the uterus and ovaries.  And after 57 years of age, a rectal exam is indicated to check for rectal cancers. We will also provide age appropriate advice regarding health maintenance, like when you should have certain vaccines, screening for sexually transmitted diseases, bone density testing, colonoscopy, mammograms, etc.   MAMMOGRAMS:  All women over 50 years old should have a yearly mammogram. Many facilities now offer a "3D" mammogram, which may cost around $50 extra out of pocket. If possible,  we recommend you accept the option to have the 3D mammogram performed.  It both reduces the number of women who will be called back for extra views which then turn out to be normal, and it is better than the routine mammogram at detecting truly abnormal areas.    COLON CANCER SCREENING: Now recommend starting at age 73. At this time colonoscopy is not covered for routine screening until 50. There are take home tests that can be done between 45-49.   COLONOSCOPY:  Colonoscopy to screen for colon cancer is recommended for all women at age 43.  We know, you hate the idea of the prep.  We agree, BUT, having colon cancer and not knowing it is worse!!  Colon cancer so often starts as a polyp that can be seen and removed at colonscopy, which can quite literally save your life!  And if your first colonoscopy is normal and you have no family history of colon cancer, most women don't have  to have it again for 10 years.  Once every ten years, you can do something that may end up saving your life, right?  We will be happy to help you get it scheduled when you are ready.  Be sure to check your insurance coverage so you understand how much it will cost.  It may be covered as a preventative service at no cost, but you should check your particular policy.      Breast Self-Awareness Breast self-awareness means being familiar with how your breasts look and feel. It involves checking your breasts  regularly and reporting any changes to your health care provider. Practicing breast self-awareness is important. A change in your breasts can be a sign of a serious medical problem. Being familiar with how your breasts look and feel allows you to find any problems early, when treatment is more likely to be successful. All women should practice breast self-awareness, including women who have had breast implants. How to do a breast self-exam One way to learn what is normal for your breasts and whether your breasts are changing is to do a breast self-exam. To do a breast self-exam: Look for Changes  1. Remove all the clothing above your waist. 2. Stand in front of a mirror in a room with good lighting. 3. Put your hands on your hips. 4. Push your hands firmly downward. 5. Compare your breasts in the mirror. Look for differences between them (asymmetry), such as: ? Differences in shape. ? Differences in size. ? Puckers, dips, and bumps in one breast and not the other. 6. Look at each breast for changes in your skin, such as: ? Redness. ? Scaly areas. 7. Look for changes in your nipples, such as: ? Discharge. ? Bleeding. ? Dimpling. ? Redness. ? A change in position. Feel for Changes Carefully feel your breasts for lumps and changes. It is best to do this while lying on your back on the floor and again while sitting or standing in the shower or tub with soapy water on your skin. Feel each breast in the following way:  Place the arm on the side of the breast you are examining above your head.  Feel your breast with the other hand.  Start in the nipple area and make  inch (2 cm) overlapping circles to feel your breast. Use the pads of your three middle fingers to do this. Apply light pressure, then medium pressure, then firm pressure. The light pressure will allow you to feel the tissue closest to the skin. The medium pressure will allow you to feel the tissue that is a little deeper. The  firm pressure will allow you to feel the tissue close to the ribs.  Continue the overlapping circles, moving downward over the breast until you feel your ribs below your breast.  Move one finger-width toward the center of the body. Continue to use the  inch (2 cm) overlapping circles to feel your breast as you move slowly up toward your collarbone.  Continue the up and down exam using all three pressures until you reach your armpit.  Write Down What You Find  Write down what is normal for each breast and any changes that you find. Keep a written record with breast changes or normal findings for each breast. By writing this information down, you do not need to depend only on memory for size, tenderness, or location. Write down where you are in your menstrual cycle, if you are still menstruating. If you  are having trouble noticing differences in your breasts, do not get discouraged. With time you will become more familiar with the variations in your breasts and more comfortable with the exam. How often should I examine my breasts? Examine your breasts every month. If you are breastfeeding, the best time to examine your breasts is after a feeding or after using a breast pump. If you menstruate, the best time to examine your breasts is 5-7 days after your period is over. During your period, your breasts are lumpier, and it may be more difficult to notice changes. When should I see my health care provider? See your health care provider if you notice:  A change in shape or size of your breasts or nipples.  A change in the skin of your breast or nipples, such as a reddened or scaly area.  Unusual discharge from your nipples.  A lump or thick area that was not there before.  Pain in your breasts.  Anything that concerns you.

## 2019-11-14 LAB — CBC
Hematocrit: 37.8 % (ref 34.0–46.6)
Hemoglobin: 12.5 g/dL (ref 11.1–15.9)
MCH: 30.7 pg (ref 26.6–33.0)
MCHC: 33.1 g/dL (ref 31.5–35.7)
MCV: 93 fL (ref 79–97)
Platelets: 214 10*3/uL (ref 150–450)
RBC: 4.07 x10E6/uL (ref 3.77–5.28)
RDW: 12.9 % (ref 11.7–15.4)
WBC: 4.4 10*3/uL (ref 3.4–10.8)

## 2019-11-14 LAB — LIPID PANEL
Chol/HDL Ratio: 3.8 ratio (ref 0.0–4.4)
Cholesterol, Total: 195 mg/dL (ref 100–199)
HDL: 51 mg/dL (ref >39)
LDL Chol Calc (NIH): 124 mg/dL — ABNORMAL HIGH (ref 0–99)
Triglycerides: 113 mg/dL (ref 0–149)
VLDL Cholesterol Cal: 20 mg/dL (ref 5–40)

## 2019-11-14 LAB — COMPREHENSIVE METABOLIC PANEL
ALT: 11 IU/L (ref 0–32)
AST: 15 IU/L (ref 0–40)
Albumin/Globulin Ratio: 1.6 (ref 1.2–2.2)
Albumin: 4.1 g/dL (ref 3.8–4.9)
Alkaline Phosphatase: 82 IU/L (ref 48–121)
BUN/Creatinine Ratio: 14 (ref 9–23)
BUN: 11 mg/dL (ref 6–24)
Bilirubin Total: 0.3 mg/dL (ref 0.0–1.2)
CO2: 26 mmol/L (ref 20–29)
Calcium: 9.2 mg/dL (ref 8.7–10.2)
Chloride: 103 mmol/L (ref 96–106)
Creatinine, Ser: 0.81 mg/dL (ref 0.57–1.00)
GFR calc Af Amer: 93 mL/min/{1.73_m2} (ref >59)
GFR calc non Af Amer: 81 mL/min/{1.73_m2} (ref >59)
Globulin, Total: 2.6 g/dL (ref 1.5–4.5)
Glucose: 84 mg/dL (ref 65–99)
Potassium: 4 mmol/L (ref 3.5–5.2)
Sodium: 142 mmol/L (ref 134–144)
Total Protein: 6.7 g/dL (ref 6.0–8.5)

## 2019-11-14 LAB — HEMOGLOBIN A1C
Est. average glucose Bld gHb Est-mCnc: 117 mg/dL
Hgb A1c MFr Bld: 5.7 % — ABNORMAL HIGH (ref 4.8–5.6)

## 2019-11-14 LAB — TSH: TSH: 2.5 u[IU]/mL (ref 0.450–4.500)

## 2019-11-14 LAB — VITAMIN D 25 HYDROXY (VIT D DEFICIENCY, FRACTURES): Vit D, 25-Hydroxy: 28.1 ng/mL — ABNORMAL LOW (ref 30.0–100.0)

## 2019-11-17 ENCOUNTER — Telehealth: Payer: Self-pay

## 2019-11-17 NOTE — Telephone Encounter (Signed)
Spoke with pt. Pt given results and recommendations per Dr Talbert Nan. Pt agreeable and verbalized understanding.   Pt states will continue to do mediterranean diet and exercise and will follow up with PCP for elevated lipids and Hgb A1C. Declines referral to prediabetic clinic at this time due to not sure insurance will cover. Will return call if wants/needs referral.   Routing to Dr Talbert Nan for review.  Encounter closed.

## 2019-11-17 NOTE — Telephone Encounter (Signed)
-----   Message from Salvadore Dom, MD sent at 11/14/2019  4:39 PM EDT ----- Please let the patient know that she still has prediabetes. Last year we referred her to the prediabetic clinic, I don't see a note in the chart from them. Please reoffer her that choice, or she can f/u with her primary. She is working on weight loss and that will help. Her LDL is mildly elevated, weight loss will also help this.  Her vit d is low, but not as low as last year. I would recommend that she increase her vit d to 2,000 IU a day (long term)

## 2019-11-18 LAB — NUSWAB BV AND CANDIDA, NAA
Candida albicans, NAA: POSITIVE — AB
Candida glabrata, NAA: NEGATIVE
Megasphaera 1: HIGH Score — AB

## 2019-11-19 ENCOUNTER — Telehealth: Payer: Self-pay

## 2019-11-19 MED ORDER — FLUCONAZOLE 150 MG PO TABS: 150.0000 mg | ORAL_TABLET | Freq: Once | ORAL | 0 refills | Status: AC

## 2019-11-19 NOTE — Telephone Encounter (Signed)
Spoke with pt. Pt given results and recommendations per Dr Talbert Nan. Rx sent to pharmacy on file. Pt agreeable and verbalized understanding. Pt states not having any vaginal discharge or odor at this time. Will return call to office if sx return.  Routing to Dr Talbert Nan for review.  Encounter closed.

## 2019-11-19 NOTE — Telephone Encounter (Signed)
-----   Message from Salvadore Dom, MD sent at 11/19/2019  8:21 AM EDT ----- Please let the patient know that her vaginitis panel returned + for yeast and treat with diflucan 150 mg x 1, may repeat in 72 hours if still symptomatic. #2, no refills. She swab was indeterminate for BV, she was just treated for this. I think her mild symptoms are from yeast. Please check if she is having any increase in watery vaginal d/c or odor.

## 2020-01-26 ENCOUNTER — Ambulatory Visit: Payer: Self-pay

## 2020-11-15 ENCOUNTER — Ambulatory Visit (INDEPENDENT_AMBULATORY_CARE_PROVIDER_SITE_OTHER): Payer: 59

## 2020-11-15 ENCOUNTER — Ambulatory Visit (INDEPENDENT_AMBULATORY_CARE_PROVIDER_SITE_OTHER): Payer: 59 | Admitting: Podiatry

## 2020-11-15 ENCOUNTER — Other Ambulatory Visit: Payer: Self-pay

## 2020-11-15 DIAGNOSIS — M7732 Calcaneal spur, left foot: Secondary | ICD-10-CM

## 2020-11-15 DIAGNOSIS — M216X9 Other acquired deformities of unspecified foot: Secondary | ICD-10-CM

## 2020-11-15 DIAGNOSIS — M722 Plantar fascial fibromatosis: Secondary | ICD-10-CM

## 2020-11-15 DIAGNOSIS — M7731 Calcaneal spur, right foot: Secondary | ICD-10-CM

## 2020-11-15 MED ORDER — MELOXICAM 15 MG PO TABS: 15.0000 mg | ORAL_TABLET | Freq: Every day | ORAL | 0 refills | Status: DC

## 2020-11-15 NOTE — Progress Notes (Signed)
  Subjective:  Patient ID: Adriana [name], female    DOB: 1961-03-29,  MRN: [id number]  Chief Complaint  Patient presents with  . Plantar Fasciitis    Bilateral heel and arch pain. X 30 years. Aching pains with numbness, tingling and edema.     60 y.o. female presents with the above complaint. History confirmed with patient. Had tried stretching without relief.  Objective:  Physical Exam: warm, good capillary refill, no trophic changes or ulcerative lesions, normal DP and PT pulses and normal sensory exam. Left Foot: tenderness to palpation medial calcaneal tuber, no pain with calcaneal squeeze, decreased ankle joint ROM and +Silverskiold test Right Foot: tenderness to palpation medial calcaneal tuber, no pain with calcaneal squeeze, decreased ankle joint ROM and +Silverskiold test normal exam, no swelling, tenderness, instability; ligaments intact, full range of motion of all ankle/foot joints other than findings noted above.  Radiographs: X-ray of both feet: no evidence of calcaneal stress fracture, plantar calcaneal spur, posterior calcaneal spur and Haglund deformity noted  Assessment:   1. Plantar fasciitis, bilateral   2. Calcaneal spur of left foot   3. Calcaneal spur of right foot   4. Equinus deformity of foot      Plan:  Patient was evaluated and treated and all questions answered.  Plantar Fasciitis -XR reviewed with patient -Educated patient on stretching and icing of the affected limb -Offered injection and PO steroids - pt declined both options. -Plantar fascial brace dispensed x2 -Rx for meloxicam. Educated on use, risks and benefits of the medication  Return in about 4 weeks (around 12/13/2020) for Plantar fasciitis.

## 2020-11-15 NOTE — Patient Instructions (Signed)

## 2020-12-13 ENCOUNTER — Ambulatory Visit (INDEPENDENT_AMBULATORY_CARE_PROVIDER_SITE_OTHER): Payer: 59 | Admitting: Podiatry

## 2020-12-13 ENCOUNTER — Other Ambulatory Visit: Payer: Self-pay

## 2020-12-13 DIAGNOSIS — Z800 Family history of malignant neoplasm of digestive organs: Secondary | ICD-10-CM | POA: Insufficient documentation

## 2020-12-13 DIAGNOSIS — M722 Plantar fascial fibromatosis: Secondary | ICD-10-CM

## 2020-12-13 DIAGNOSIS — R1310 Dysphagia, unspecified: Secondary | ICD-10-CM | POA: Insufficient documentation

## 2020-12-13 DIAGNOSIS — Z1211 Encounter for screening for malignant neoplasm of colon: Secondary | ICD-10-CM | POA: Insufficient documentation

## 2020-12-13 DIAGNOSIS — M7731 Calcaneal spur, right foot: Secondary | ICD-10-CM | POA: Diagnosis not present

## 2020-12-13 DIAGNOSIS — K5900 Constipation, unspecified: Secondary | ICD-10-CM | POA: Insufficient documentation

## 2020-12-13 DIAGNOSIS — M7732 Calcaneal spur, left foot: Secondary | ICD-10-CM

## 2020-12-13 MED ORDER — METHYLPREDNISOLONE 4 MG PO TBPK: ORAL_TABLET | ORAL | 0 refills | Status: DC

## 2020-12-13 NOTE — Patient Instructions (Signed)

## 2020-12-13 NOTE — Progress Notes (Signed)
  Subjective:  Patient ID: Adriana [name], female    DOB: [birth year],  MRN: [id number]  Chief Complaint  Patient presents with   Plantar Fasciitis    Denies any changes. Has only worn PF braces once. Has used ball for exercises.     60 y.o. female presents with the above complaint. History confirmed with patient.    Objective:  Physical Exam: warm, good capillary refill, no trophic changes or ulcerative lesions, normal DP and PT pulses and normal sensory exam. Left Foot: tenderness to palpation medial calcaneal tuber, no pain with calcaneal squeeze, decreased ankle joint ROM and +Silverskiold test Right Foot: tenderness to palpation medial calcaneal tuber, no pain with calcaneal squeeze, decreased ankle joint ROM and +Silverskiold test normal exam, no swelling, tenderness, instability; ligaments intact, full range of motion of all ankle/foot joints other than findings noted above.  Assessment:   1. Plantar fasciitis, bilateral   2. Calcaneal spur of left foot   3. Calcaneal spur of right foot    Plan:  Patient was evaluated and treated and all questions answered.  Plantar Fasciitis -Again declined injection. -Rx medrol pack. Pt to stop the Meloxicam during htat time -New stretching exercises dispesned. -Consider injection next visit if pain persists.  Return in about 4 weeks (around 01/10/2021) for Plantar fasciitis.

## 2021-01-10 ENCOUNTER — Ambulatory Visit: Payer: 59 | Admitting: Podiatry

## 2021-01-25 ENCOUNTER — Ambulatory Visit
Admission: RE | Admit: 2021-01-25 | Discharge: 2021-01-25 | Disposition: A | Discharge status: Home / Self Care | Payer: 59 | Source: Ambulatory Visit | Attending: Family Medicine | Admitting: Family Medicine

## 2021-01-25 ENCOUNTER — Other Ambulatory Visit: Payer: Self-pay

## 2021-01-25 VITALS — BP 119/72 | HR 100 | Temp 98.6°F | Resp 18

## 2021-01-25 DIAGNOSIS — J069 Acute upper respiratory infection, unspecified: Secondary | ICD-10-CM | POA: Diagnosis not present

## 2021-01-25 MED ORDER — BENZONATATE 100 MG PO CAPS: ORAL_CAPSULE | ORAL | 0 refills | Status: DC

## 2021-01-25 NOTE — Discharge Instructions (Addendum)
You have been tested for COVID-19 today. °If your test returns positive, you will receive a phone call from  regarding your results. °Negative test results are not called. °Both positive and negative results area always visible on MyChart. °If you do not have a MyChart account, sign up instructions are provided in your discharge papers. °Please do not hesitate to contact us should you have questions or concerns. ° °

## 2021-01-25 NOTE — ED Triage Notes (Signed)
Three day h/o cough, chills, fever, fatigue, congestion and body aches. Tmax 101.4. No meds taken. Denies abdominal pain, n/v/d. No at home covid testing. Pt is vaccinated but not boosted.

## 2021-01-25 NOTE — ED Provider Notes (Signed)
[facility]   [id number] 01/25/21 Arrival Time: [id number]  ASSESSMENT & PLAN:  1. Viral URI with cough    Discussed typical duration of viral illnesses. COVID-19 testing sent. OTC symptom care as needed.  Meds ordered this encounter  Medications   benzonatate (TESSALON) 100 MG capsule    Sig: Take 1 capsule by mouth every 8 (eight) hours for cough.    Dispense:  21 capsule    Refill:  0     Follow-up Information     Mikey Kirschner, MD.   Specialty: Family Medicine Why: As needed. Contact information: [place] [address] [phone]         [facility] [place].   Specialty: Urgent Care Why: If worsening or failing to improve as anticipated. Contact information: [address] [place] [phone]                Reviewed expectations re: course of current medical issues. Questions answered. Outlined signs and symptoms indicating need for more acute intervention. Understanding verbalized. After Visit Summary given.   SUBJECTIVE: History from: patient. Adriana [name] is a 60 y.o. female who presents with worries regarding COVID-19. Known COVID-19 contact: none. Recent travel: none. Reports: cough, chills, fatigue, congestion, body aches; x 1-2 d. T101.44F. Denies: difficulty breathing. Normal PO intake without n/v/d.  OBJECTIVE:  Vitals:   01/25/21 1120  BP: 119/72  Pulse: 100  Resp: 18  Temp: 98.6 F (37 C)  TempSrc: Oral  SpO2: 96%    General appearance: alert; no distress Eyes: PERRLA; EOMI; conjunctiva normal HENT: [place]; AT; with nasal congestion Neck: supple  Lungs: speaks full sentences without difficulty; unlabored; coughing Extremities: no edema Skin: warm and dry Neurologic: normal gait Psychological: alert and cooperative; normal mood and affect  Labs: Labs Reviewed  NOVEL CORONAVIRUS, NAA   Allergies  Allergen Reactions    Hydrocodone Nausea And Vomiting    Past Medical History:  Diagnosis Date   Anemia    history   Anxiety    Benign positional vertigo 12/19/13   Depression    Dyspnea    with exertion   Edema    Endometrial polyp    Fibroids    GERD (gastroesophageal reflux disease)    Insomnia    Internal and external hemorrhoids without complication    Migraine    Morbid obesity ([facility])    Plantar fasciitis    PMB (postmenopausal bleeding)    Rectal bleeding    Reflux    Social History   Socioeconomic History   Marital status: Divorced    Spouse name: Not on file   Number of children: 1   Years of education: Not on file   Highest education level: Not on file  Occupational History   Occupation: Counsellor: Dance movement psychotherapist  Tobacco Use   Smoking status: Never   Smokeless tobacco: Never  Vaping Use   Vaping Use: Never used  Substance and Sexual Activity   Alcohol use: Yes    Alcohol/week: 0.0 standard drinks    Comment: 1 a month   Drug use: No   Sexual activity: Not Currently    Partners: Male    Birth control/protection: Post-menopausal  Other Topics Concern   Not on file  Social History Narrative   Not on file   Social Determinants of Health   Financial Resource Strain: Not on file  Food Insecurity: Not on  file  Transportation Needs: Not on file  Physical Activity: Not on file  Stress: Not on file  Social Connections: Not on file  Intimate Partner Violence: Not on file   Family History  Problem Relation Age of Onset   Colon cancer Father        diagnosed in late 4s.    Hypertension Father    Heart attack Father    Esophageal cancer Neg Hx    Breast cancer Neg Hx    Past Surgical History:  Procedure Laterality Date   DILATATION & CURETTAGE/HYSTEROSCOPY WITH MYOSURE N/A 07/23/2017   Procedure: DILATATION & CURETTAGE/HYSTEROSCOPY WITH MYOSURE;  Surgeon: Salvadore Dom, MD;  Location: [facility];  Service:  Gynecology;  Laterality: N/A;  endometrial polyp   DILATION AND CURETTAGE OF UTERUS     HYSTEROSCOPY     None       Vanessa Kick, MD 01/25/21 1836

## 2021-01-26 LAB — SARS-COV-2, NAA 2 DAY TAT

## 2021-01-26 LAB — NOVEL CORONAVIRUS, NAA: SARS-CoV-2, NAA: DETECTED — AB

## 2021-02-24 ENCOUNTER — Other Ambulatory Visit: Payer: Self-pay

## 2021-02-24 ENCOUNTER — Ambulatory Visit (INDEPENDENT_AMBULATORY_CARE_PROVIDER_SITE_OTHER): Payer: 59 | Admitting: Family Medicine

## 2021-02-24 VITALS — BP 130/87 | HR 79 | Temp 97.2°F | Ht 65.0 in | Wt 259.2 lb

## 2021-02-24 DIAGNOSIS — F419 Anxiety disorder, unspecified: Secondary | ICD-10-CM

## 2021-02-24 DIAGNOSIS — R5382 Chronic fatigue, unspecified: Secondary | ICD-10-CM | POA: Diagnosis not present

## 2021-02-24 DIAGNOSIS — R7303 Prediabetes: Secondary | ICD-10-CM | POA: Diagnosis not present

## 2021-02-24 DIAGNOSIS — F32A Depression, unspecified: Secondary | ICD-10-CM

## 2021-02-24 NOTE — Progress Notes (Signed)
Patient ID: Adriana [name], female    DOB: 07-18-1960, 60 y.o.   MRN: [id number]   Chief Complaint  Patient presents with   Fatigue   Subjective:    HPI CC-multiple complaints, establish care visit.   Pt has been feeling exhausted, short of breath. Pt states that has been going on a for a while. 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 Pt states she is not if she has been exposed to mold or asbestos. Pt states house is old and ceiling has been leaking.   Had depression as a child.  Not wanting to take meds for it. Not been in and feeling tired.  Having sob. And h/o plantar fasciitis. Eating well and organic.  Pt had mold in house and not sure why she is sob and fatigued.  Last a1c 5.7.  Had low vit d at 28.1.  Sleep- not sleeping well.  Not sleeping more than 2 hrs then wake up.  Has been like this in past 30 yrs after having her daughter.   In older house and more spiders in house and not sure what to do.  Saw Dr. Trudee Grip- and seeing [facility]  Chest xray normal- negaitve in 2020. Similar concern sob -chronic. Even seen by cards in 2010.   Went to ER- 01/25/21 and had some chest discomfort and dx with covid. Improved after that.    Medical History Sybrina has a past medical history of Anemia, Anxiety, Benign positional vertigo (12/19/13), Depression, Dyspnea, Edema, Endometrial polyp, Fibroids, GERD (gastroesophageal reflux disease), Insomnia, Internal and external hemorrhoids without complication, Migraine, Morbid obesity ([place]), Plantar fasciitis, PMB (postmenopausal bleeding), Rectal bleeding, and Reflux.   Outpatient Encounter Medications as of 02/24/2021  Medication Sig   albuterol (VENTOLIN HFA) 108 (90 Base) MCG/ACT inhaler Inhale 2 puffs into the lungs every 4 (four) hours as needed for wheezing.   cholecalciferol (VITAMIN D) 1000 UNITS tablet Take 1,000 Units by mouth 3 (three) times a week.    ibuprofen (ADVIL) 400 MG tablet  Take 1 tablet (400 mg total) by mouth every 6 (six) hours as needed.   Magnesium 400 MG TABS Take 1 tablet by mouth as needed.   [DISCONTINUED] benzonatate (TESSALON) 100 MG capsule Take 1 capsule by mouth every 8 (eight) hours for cough.   [DISCONTINUED] meloxicam (MOBIC) 15 MG tablet Take 1 tablet (15 mg total) by mouth daily.   [DISCONTINUED] methylPREDNISolone (MEDROL DOSEPAK) 4 MG TBPK tablet 6 Day Taper Pack. Take as Directed.   [DISCONTINUED] metroNIDAZOLE (METROGEL) 0.75 % vaginal gel Place 1 Applicatorful vaginally at bedtime. Use nightly for 5 nights. (Patient not taking: Reported on 11/15/2020)   Facility-Administered Encounter Medications as of 02/24/2021  Medication   0.9 %  sodium chloride infusion     Review of Systems  Constitutional:  Negative for chills and fever.  HENT:  Negative for congestion, rhinorrhea and sore throat.   Respiratory:  Negative for cough, shortness of breath and wheezing.   Cardiovascular:  Negative for chest pain and leg swelling.  Gastrointestinal:  Negative for abdominal pain, diarrhea, nausea and vomiting.  Genitourinary:  Negative for dysuria and frequency.  Musculoskeletal:  Negative for arthralgias and back pain.  Skin:  Negative for rash.  Neurological:  Negative for dizziness, weakness and headaches.    Vitals BP 130/87   Pulse 79   Temp (!) 97.2 F (36.2 C)   Ht '5\' 5"'$  (1.651 m)   Wt 259 lb 3.2 oz (117.6  kg)   LMP 01/16/2014   SpO2 98%   BMI 43.13 kg/m   Objective:   Physical Exam Vitals and nursing note reviewed.  Constitutional:      General: She is not in acute distress.    Appearance: Normal appearance. She is obese. She is not ill-appearing.  HENT:     Head: Normocephalic and atraumatic.     Nose: Nose normal.     Mouth/Throat:     Mouth: Mucous membranes are moist.     Pharynx: Oropharynx is clear.  Eyes:     Extraocular Movements: Extraocular movements intact.     Conjunctiva/sclera: Conjunctivae normal.      Pupils: Pupils are equal, round, and reactive to light.  Cardiovascular:     Rate and Rhythm: Normal rate and regular rhythm.     Pulses: Normal pulses.     Heart sounds: Normal heart sounds.  Pulmonary:     Effort: Pulmonary effort is normal.     Breath sounds: Normal breath sounds. No wheezing, rhonchi or rales.  Musculoskeletal:        General: Normal range of motion.     Cervical back: Normal range of motion and neck supple. No tenderness.     Right lower leg: No edema.     Left lower leg: No edema.  Skin:    General: Skin is warm and dry.     Findings: No lesion or rash.  Neurological:     General: No focal deficit present.     Mental Status: She is alert and oriented to person, place, and time.  Psychiatric:        Mood and Affect: Mood normal.        Behavior: Behavior normal.     Assessment and Plan   1. Chronic fatigue - TSH; Future - CBC With Differential; Future - CBC With Differential - TSH  2. Prediabetes - Hemoglobin A1c; Future - Hemoglobin A1c  3. Anxiety and depression  4. Morbid obesity ([place])   Ordered labs, will start work up.  Pt hasn't been seen in couple years.  Advising to f/u with new pcp for other concerns.  Will work up chronic fatigue and prediabetes.  Pt with anxiety /depression -long standing not wanting to have meds for this. Cont healthy diet and exercising.  Return in about 3 months (around 05/26/2021).

## 2021-02-25 LAB — CBC WITH DIFFERENTIAL
Basophils Absolute: 0 10*3/uL (ref 0.0–0.2)
Basos: 1 %
EOS (ABSOLUTE): 0.1 10*3/uL (ref 0.0–0.4)
Eos: 2 %
Hematocrit: 38.5 % (ref 34.0–46.6)
Hemoglobin: 12.6 g/dL (ref 11.1–15.9)
Immature Grans (Abs): 0 10*3/uL (ref 0.0–0.1)
Immature Granulocytes: 0 %
Lymphocytes Absolute: 1.6 10*3/uL (ref 0.7–3.1)
Lymphs: 34 %
MCH: 30.1 pg (ref 26.6–33.0)
MCHC: 32.7 g/dL (ref 31.5–35.7)
MCV: 92 fL (ref 79–97)
Monocytes Absolute: 0.4 10*3/uL (ref 0.1–0.9)
Monocytes: 9 %
Neutrophils Absolute: 2.5 10*3/uL (ref 1.4–7.0)
Neutrophils: 54 %
RBC: 4.18 x10E6/uL (ref 3.77–5.28)
RDW: 12.8 % (ref 11.7–15.4)
WBC: 4.7 10*3/uL (ref 3.4–10.8)

## 2021-02-25 LAB — TSH: TSH: 2.82 u[IU]/mL (ref 0.450–4.500)

## 2021-02-25 LAB — HGB A1C W/O EAG: Hgb A1c MFr Bld: 5.8 % — ABNORMAL HIGH (ref 4.8–5.6)

## 2021-04-04 ENCOUNTER — Ambulatory Visit: Payer: 59 | Admitting: Obstetrics and Gynecology

## 2021-04-18 NOTE — Progress Notes (Deleted)
60 y.o. G52P1011 Divorced White or Caucasian Not Hispanic or Latino female here for annual exam.      Patient's last menstrual period was 01/16/2014.          Sexually active: {yes no:314532}  The current method of family planning is {contraception:315051}.    Exercising: {yes no:314532}  {types:19826} Smoker:  no  Health Maintenance: Pap:  03-26-17 Normal History of abnormal Pap:  yes-many yrs.ago-colpo MMG:  09-12-18 normal BMD:   never Colonoscopy: 2019 sessile polyps TDaP:  2018 Gardasil: N/A   reports that she has never smoked. She has never used smokeless tobacco. She reports current alcohol use. She reports that she does not use drugs.  Past Medical History:  Diagnosis Date   Anemia    history   Anxiety    Benign positional vertigo 12/19/13   Depression    Dyspnea    with exertion   Edema    Endometrial polyp    Fibroids    GERD (gastroesophageal reflux disease)    Insomnia    Internal and external hemorrhoids without complication    Migraine    Morbid obesity ([place])    Plantar fasciitis    PMB (postmenopausal bleeding)    Rectal bleeding    Reflux     Past Surgical History:  Procedure Laterality Date   DILATATION & CURETTAGE/HYSTEROSCOPY WITH MYOSURE N/A 07/23/2017   Procedure: DILATATION & CURETTAGE/HYSTEROSCOPY WITH MYOSURE;  Surgeon: Salvadore Dom, MD;  Location: [facility];  Service: Gynecology;  Laterality: N/A;  endometrial polyp   DILATION AND CURETTAGE OF UTERUS     HYSTEROSCOPY     None      Current Outpatient Medications  Medication Sig Dispense Refill   albuterol (VENTOLIN HFA) 108 (90 Base) MCG/ACT inhaler Inhale 2 puffs into the lungs every 4 (four) hours as needed for wheezing. 1 Inhaler 2   cholecalciferol (VITAMIN D) 1000 UNITS tablet Take 1,000 Units by mouth 3 (three) times a week.      ibuprofen (ADVIL) 400 MG tablet Take 1 tablet (400 mg total) by mouth every 6 (six) hours as needed. 30 tablet 0   Magnesium 400 MG  TABS Take 1 tablet by mouth as needed.     Current Facility-Administered Medications  Medication Dose Route Frequency Provider Last Rate Last Admin   0.9 %  sodium chloride infusion  500 mL Intravenous Once Nandigam, Venia Minks, MD        Family History  Problem Relation Age of Onset   Colon cancer Father        diagnosed in late 29s.    Hypertension Father    Heart attack Father    Esophageal cancer Neg Hx    Breast cancer Neg Hx     Review of Systems  Exam:   LMP 01/16/2014   Weight change: @WEIGHTCHANGE @ Height:      Ht Readings from Last 3 Encounters:  02/24/21 5\' 5"  (1.651 m)  11/13/19 5' 6.5" (1.689 m)  10/27/19 5' 6.5" (1.689 m)    General appearance: alert, cooperative and appears stated age Head: Normocephalic, without obvious abnormality, atraumatic Neck: no adenopathy, supple, symmetrical, trachea midline and thyroid {CHL AMB PHY EX THYROID NORM DEFAULT:[phone]::"normal to inspection and palpation"} Lungs: clear to auscultation bilaterally Cardiovascular: regular rate and rhythm Breasts: {Exam; breast:13139::"normal appearance, no masses or tenderness"} Abdomen: soft, non-tender; non distended,  no masses,  no organomegaly Extremities: extremities normal, atraumatic, no cyanosis or edema Skin: Skin color, texture, turgor normal. No  rashes or lesions Lymph nodes: Cervical, supraclavicular, and axillary nodes normal. No abnormal inguinal nodes palpated Neurologic: Grossly normal   Pelvic: External genitalia:  no lesions              Urethra:  normal appearing urethra with no masses, tenderness or lesions              Bartholins and Skenes: normal                 Vagina: normal appearing vagina with normal color and discharge, no lesions              Cervix: {CHL AMB PHY EX CERVIX NORM DEFAULT:[phone]::"no lesions"}               Bimanual Exam:  Uterus:  {CHL AMB PHY EX UTERUS NORM [phone]::"normal size, contour, position, consistency, mobility,  non-tender"}              Adnexa: {CHL AMB PHY EX ADNEXA NO MASS DEFAULT:[phone]::"no mass, fullness, tenderness"}               Rectovaginal: Confirms               Anus:  normal sphincter tone, no lesions  *** chaperoned for the exam.  A:  Well Woman with normal exam  P:

## 2021-04-27 ENCOUNTER — Emergency Department (HOSPITAL_COMMUNITY): Payer: 59

## 2021-04-27 ENCOUNTER — Encounter (HOSPITAL_COMMUNITY): Payer: Self-pay

## 2021-04-27 ENCOUNTER — Emergency Department (HOSPITAL_COMMUNITY)
Admission: EM | Admit: 2021-04-27 | Discharge: 2021-04-27 | Disposition: A | Discharge status: Home / Self Care | Payer: 59 | Attending: Emergency Medicine | Admitting: Emergency Medicine

## 2021-04-27 ENCOUNTER — Other Ambulatory Visit: Payer: Self-pay

## 2021-04-27 DIAGNOSIS — S161XXA Strain of muscle, fascia and tendon at neck level, initial encounter: Secondary | ICD-10-CM | POA: Diagnosis not present

## 2021-04-27 DIAGNOSIS — Y92410 Unspecified street and highway as the place of occurrence of the external cause: Secondary | ICD-10-CM | POA: Diagnosis not present

## 2021-04-27 DIAGNOSIS — M25512 Pain in left shoulder: Secondary | ICD-10-CM | POA: Diagnosis not present

## 2021-04-27 DIAGNOSIS — M25511 Pain in right shoulder: Secondary | ICD-10-CM | POA: Diagnosis not present

## 2021-04-27 DIAGNOSIS — S39012A Strain of muscle, fascia and tendon of lower back, initial encounter: Secondary | ICD-10-CM

## 2021-04-27 DIAGNOSIS — S0990XA Unspecified injury of head, initial encounter: Secondary | ICD-10-CM | POA: Diagnosis not present

## 2021-04-27 DIAGNOSIS — S199XXA Unspecified injury of neck, initial encounter: Secondary | ICD-10-CM | POA: Diagnosis present
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
## 2021-04-27 NOTE — ED Notes (Signed)
Patient transported to CT/XR ?
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
## 2021-04-28 ENCOUNTER — Ambulatory Visit: Payer: 59 | Admitting: Obstetrics and Gynecology

## 2021-05-01 NOTE — Progress Notes (Signed)
60 y.o. G19P1011 Divorced White or Caucasian Not Hispanic or Latino female here for annual exam.  No vaginal bleeding. She is with her same long term partner (has been off and on for 14-15 years). No pain with sex. She is having an issue with orgasms, they are less frequent and less intense. Libido is low.     Patient's last menstrual period was 01/16/2014.          Sexually active: Yes.    The current method of family planning is post menopausal status.    Exercising: No.  The patient does not participate in regular exercise at present. Smoker:  no  Health Maintenance: Pap: 03/26/17 normal HPV Neg , 01/26/15 neg. HR HPV:neg  History of abnormal Pap:  Years ago, had a colposcopy, no h/o surgery MMG:  09/12/18 density C Bi-rads 1 neg  BMD:   none  Colonoscopy: 04/25/18 f/u 3 years  TDaP:  03/26/2017 Gardasil: na   reports that she has never smoked. She has never used smokeless tobacco. She reports current alcohol use. She reports that she does not use drugs. Occasional ETOH. 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 Daughter is in [place], [place], looking for work.   Past Medical History:  Diagnosis Date   Anemia    history   Anxiety    Benign positional vertigo 12/19/13   Depression    Dyspnea    with exertion   Edema    Endometrial polyp    Fibroids    GERD (gastroesophageal reflux disease)    Insomnia    Internal and external hemorrhoids without complication    Migraine    Morbid obesity ([place])    Plantar fasciitis    PMB (postmenopausal bleeding)    Rectal bleeding    Reflux     Past Surgical History:  Procedure Laterality Date   DILATATION & CURETTAGE/HYSTEROSCOPY WITH MYOSURE N/A 07/23/2017   Procedure: DILATATION & CURETTAGE/HYSTEROSCOPY WITH MYOSURE;  Surgeon: Salvadore Dom, MD;  Location: [facility];  Service: Gynecology;  Laterality: N/A;  endometrial polyp   DILATION AND CURETTAGE OF UTERUS     HYSTEROSCOPY     None       Current Outpatient Medications  Medication Sig Dispense Refill   cholecalciferol (VITAMIN D) 1000 UNITS tablet Take 1,000 Units by mouth 3 (three) times a week.      ibuprofen (ADVIL) 400 MG tablet Take 1 tablet (400 mg total) by mouth every 6 (six) hours as needed. 30 tablet 0   Magnesium 400 MG TABS Take 1 tablet by mouth as needed.     No current facility-administered medications for this visit.    Family History  Problem Relation Age of Onset   Colon cancer Father        diagnosed in late 37s.    Hypertension Father    Heart attack Father    Esophageal cancer Neg Hx    Breast cancer Neg Hx     Review of Systems  All other systems reviewed and are negative.  Exam:   BP 112/70   Pulse 77   Ht 5' 6.5" (1.689 m)   Wt 259 lb (117.5 kg)   LMP 01/16/2014   SpO2 98%   BMI 41.18 kg/m   Weight change: @WEIGHTCHANGE @ Height:   Height: 5' 6.5" (168.9 cm)  Ht Readings from Last 3 Encounters:  05/09/21 5' 6.5" (1.689 m)  04/27/21 5\' 7"  (1.702 m)  02/24/21 5\' 5"  (1.651 m)  General appearance: alert, cooperative and appears stated age Head: Normocephalic, without obvious abnormality, atraumatic Neck: no adenopathy, supple, symmetrical, trachea midline and thyroid normal to inspection and palpation Lungs: clear to auscultation bilaterally Cardiovascular: regular rate and rhythm Breasts: normal appearance, no masses or tenderness Abdomen: soft, non-tender; non distended,  no masses,  no organomegaly Extremities: extremities normal, atraumatic, no cyanosis or edema Skin: Skin color, texture, turgor normal. No rashes or lesions Lymph nodes: Cervical, supraclavicular, and axillary nodes normal. No abnormal inguinal nodes palpated Neurologic: Grossly normal   Pelvic: External genitalia:  no lesions              Urethra:  normal appearing urethra with no masses, tenderness or lesions              Bartholins and Skenes: normal                 Vagina: well estrogenized  appearing vagina with a slight increase in thin, watery, white vaginal d/c.              Cervix: no lesions               Bimanual Exam:  Uterus:   no masses or tenderness              Adnexa: no mass, fullness, tenderness               Rectovaginal: Confirms               Anus:  normal sphincter tone, no lesions  Gae Dry chaperoned for the exam.  On questioning, the patient does report a slight increase in vaginal d/c, no odor, no irritation.  1. 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 Low libido - Testos,Total,Free and SHBG (Female)  4. Screening examination for STD (sexually transmitted disease) - HIV Antibody (routine testing w rflx) - RPR - SURESWAB CT/NG/T. vaginalis  6. Bacterial vaginitis - metroNIDAZOLE (METROGEL) 0.75 % vaginal gel; Place 1 Applicatorful vaginally at bedtime. Use for 5 nights  Dispense: 70 g; Refill: 0

## 2021-05-02 ENCOUNTER — Ambulatory Visit: Payer: 59 | Admitting: Family Medicine

## 2021-05-02 ENCOUNTER — Encounter: Payer: Self-pay | Admitting: Family Medicine

## 2021-05-03 ENCOUNTER — Ambulatory Visit
Admission: EM | Admit: 2021-05-03 | Discharge: 2021-05-03 | Disposition: A | Discharge status: Home / Self Care | Payer: 59 | Attending: Family Medicine | Admitting: Family Medicine

## 2021-05-03 ENCOUNTER — Other Ambulatory Visit: Payer: Self-pay

## 2021-05-03 DIAGNOSIS — M542 Cervicalgia: Secondary | ICD-10-CM | POA: Diagnosis not present

## 2021-05-03 DIAGNOSIS — M546 Pain in thoracic spine: Secondary | ICD-10-CM

## 2021-05-03 MED ORDER — CYCLOBENZAPRINE HCL 10 MG PO TABS: ORAL_TABLET | ORAL | 0 refills | Status: DC
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
## 2021-05-09 ENCOUNTER — Ambulatory Visit (INDEPENDENT_AMBULATORY_CARE_PROVIDER_SITE_OTHER): Payer: 59 | Admitting: Obstetrics and Gynecology

## 2021-05-09 ENCOUNTER — Encounter: Payer: Self-pay | Admitting: Obstetrics and Gynecology

## 2021-05-09 ENCOUNTER — Other Ambulatory Visit: Payer: Self-pay

## 2021-05-09 VITALS — BP 112/70 | HR 77 | Ht 66.5 in | Wt 259.0 lb

## 2021-05-09 DIAGNOSIS — R6882 Decreased libido: Secondary | ICD-10-CM | POA: Diagnosis not present

## 2021-05-09 DIAGNOSIS — F5231 Female orgasmic disorder: Secondary | ICD-10-CM

## 2021-05-09 DIAGNOSIS — N760 Acute vaginitis: Secondary | ICD-10-CM

## 2021-05-09 DIAGNOSIS — R7303 Prediabetes: Secondary | ICD-10-CM | POA: Insufficient documentation

## 2021-05-09 DIAGNOSIS — B9689 Other specified bacterial agents as the cause of diseases classified elsewhere: Secondary | ICD-10-CM

## 2021-05-09 DIAGNOSIS — N898 Other specified noninflammatory disorders of vagina: Secondary | ICD-10-CM | POA: Diagnosis not present

## 2021-05-09 DIAGNOSIS — Z800 Family history of malignant neoplasm of digestive organs: Secondary | ICD-10-CM | POA: Insufficient documentation

## 2021-05-09 DIAGNOSIS — Z113 Encounter for screening for infections with a predominantly sexual mode of transmission: Secondary | ICD-10-CM | POA: Diagnosis not present

## 2021-05-09 DIAGNOSIS — Z01419 Encounter for gynecological examination (general) (routine) without abnormal findings: Secondary | ICD-10-CM

## 2021-05-09 LAB — WET PREP FOR TRICH, YEAST, CLUE

## 2021-05-09 MED ORDER — METRONIDAZOLE 0.75 % VA GEL
1.0000 | Freq: Every day | VAGINAL | 0 refills | Status: DC
Start: 2021-05-09 — End: 2021-07-12

## 2021-05-09 NOTE — Patient Instructions (Addendum)
Call Dr Mauri Pole, MD to schedule a colonoscopy  EXERCISE   We recommended that you start or continue a regular exercise program for good health. Physical activity is anything that gets your body moving, some is better than none. The CDC recommends 150 minutes per week of Moderate-Intensity Aerobic Activity and 2 or more days of Muscle Strengthening Activity.  Benefits of exercise are limitless: helps weight loss/weight maintenance, improves mood and energy, helps with depression and anxiety, improves sleep, tones and strengthens muscles, improves balance, improves bone density, protects from chronic conditions such as heart disease, high blood pressure and diabetes and so much more. To learn more visit: WhyNotPoker.uy  DIET: Good nutrition starts with a healthy diet of fruits, vegetables, whole grains, and lean protein sources. Drink plenty of water for hydration. Minimize empty calories, sodium, sweets. For more information about dietary recommendations visit: GeekRegister.com.ee and http://schaefer-mitchell.com/  ALCOHOL:  Women should limit their alcohol intake to no more than 7 drinks/beers/glasses of wine (combined, not each!) per week. Moderation of alcohol intake to this level decreases your risk of breast cancer and liver damage.  If you are concerned that you may have a problem, or your friends have told you they are concerned about your drinking, there are many resources to help. A well-known program that is free, effective, and available to all people all over the nation is Alcoholics Anonymous.  Check out this site to learn more: BlockTaxes.se   CALCIUM AND VITAMIN D:  Adequate intake of calcium and Vitamin D are recommended for bone health.  You should be getting between 1000-1200 mg of calcium and 800 units of Vitamin D daily between diet and supplements  PAP SMEARS:  Pap smears, to check  for cervical cancer or precancers,  have traditionally been done yearly, scientific advances have shown that most women can have pap smears less often.  However, every woman still should have a physical exam from her gynecologist every year. It will include a breast check, inspection of the vulva and vagina to check for abnormal growths or skin changes, a visual exam of the cervix, and then an exam to evaluate the size and shape of the uterus and ovaries. We will also provide age appropriate advice regarding health maintenance, like when you should have certain vaccines, screening for sexually transmitted diseases, bone density testing, colonoscopy, mammograms, etc.   MAMMOGRAMS:  All women over 73 years old should have a routine mammogram.   COLON CANCER SCREENING: Now recommend starting at age 12. At this time colonoscopy is not covered for routine screening until 50. There are take home tests that can be done between 45-49.   COLONOSCOPY:  Colonoscopy to screen for colon cancer is recommended for all women at age 9.  We know, you hate the idea of the prep.  We agree, BUT, having colon cancer and not knowing it is worse!!  Colon cancer so often starts as a polyp that can be seen and removed at colonscopy, which can quite literally save your life!  And if your first colonoscopy is normal and you have no family history of colon cancer, most women don't have to have it again for 10 years.  Once every ten years, you can do something that may end up saving your life, right?  We will be happy to help you get it scheduled when you are ready.  Be sure to check your insurance coverage so you understand how much it will cost.  It may be covered  as a preventative service at no cost, but you should check your particular policy.      Breast Self-Awareness Breast self-awareness means being familiar with how your breasts look and feel. It involves checking your breasts regularly and reporting any changes to your  health care provider. Practicing breast self-awareness is important. A change in your breasts can be a sign of a serious medical problem. Being familiar with how your breasts look and feel allows you to find any problems early, when treatment is more likely to be successful. All women should practice breast self-awareness, including women who have had breast implants. How to do a breast self-exam One way to learn what is normal for your breasts and whether your breasts are changing is to do a breast self-exam. To do a breast self-exam: Look for Changes  Remove all the clothing above your waist. Stand in front of a mirror in a room with good lighting. Put your hands on your hips. Push your hands firmly downward. Compare your breasts in the mirror. Look for differences between them (asymmetry), such as: Differences in shape. Differences in size. Puckers, dips, and bumps in one breast and not the other. Look at each breast for changes in your skin, such as: Redness. Scaly areas. Look for changes in your nipples, such as: Discharge. Bleeding. Dimpling. Redness. A change in position. Feel for Changes Carefully feel your breasts for lumps and changes. It is best to do this while lying on your back on the floor and again while sitting or standing in the shower or tub with soapy water on your skin. Feel each breast in the following way: Place the arm on the side of the breast you are examining above your head. Feel your breast with the other hand. Start in the nipple area and make  inch (2 cm) overlapping circles to feel your breast. Use the pads of your three middle fingers to do this. Apply light pressure, then medium pressure, then firm pressure. The light pressure will allow you to feel the tissue closest to the skin. The medium pressure will allow you to feel the tissue that is a little deeper. The firm pressure will allow you to feel the tissue close to the ribs. Continue the overlapping  circles, moving downward over the breast until you feel your ribs below your breast. Move one finger-width toward the center of the body. Continue to use the  inch (2 cm) overlapping circles to feel your breast as you move slowly up toward your collarbone. Continue the up and down exam using all three pressures until you reach your armpit.  Write Down What You Find  Write down what is normal for each breast and any changes that you find. Keep a written record with breast changes or normal findings for each breast. By writing this information down, you do not need to depend only on memory for size, tenderness, or location. Write down where you are in your menstrual cycle, if you are still menstruating. If you are having trouble noticing differences in your breasts, do not get discouraged. With time you will become more familiar with the variations in your breasts and more comfortable with the exam. How often should I examine my breasts? Examine your breasts every month. If you are breastfeeding, the best time to examine your breasts is after a feeding or after using a breast pump. If you menstruate, the best time to examine your breasts is 5-7 days after your period is over. During  your period, your breasts are lumpier, and it may be more difficult to notice changes. When should I see my health care provider? See your health care provider if you notice: A change in shape or size of your breasts or nipples. A change in the skin of your breast or nipples, such as a reddened or scaly area. Unusual discharge from your nipples. A lump or thick area that was not there before. Pain in your breasts. Anything that concerns you. Bacterial Vaginosis Bacterial vaginosis is an infection that occurs when the normal balance of bacteria in the vagina changes. This change is caused by an overgrowth of certain bacteria in the vagina. Bacterial vaginosis is the most common vaginal infection among females aged 58 to  86 years. This condition increases the risk of sexually transmitted infections (STIs). Treatment can help reduce this risk. Treatment is very important for pregnant women because this condition can cause babies to be born early (prematurely) or at a low birth weight. What are the causes? This condition is caused by an increase in harmful bacteria that are normally present in small amounts in the vagina. However, the exact reason this condition develops is not known. You cannot get bacterial vaginosis from toilet seats, bedding, swimming pools, or contact with objects around you. What increases the risk? The following factors may make you more likely to develop this condition: Having a new sexual partner or multiple sexual partners, or having unprotected sex. Douching. Having an intrauterine device (IUD). Smoking. Abusing drugs and alcohol. This may lead to riskier sexual behavior. Taking certain antibiotic medicines. Being pregnant. What are the signs or symptoms? Some women with this condition have no symptoms. Symptoms may include: Pearline Cables or white vaginal discharge. The discharge can be watery or foamy. A fish-like odor with discharge, especially after sex or during menstruation. Itching in and around the vagina. Burning or pain with urination. How is this diagnosed? This condition is diagnosed based on: Your medical history. A physical exam of the vagina. Checking a sample of vaginal fluid for harmful bacteria or abnormal cells. How is this treated? This condition is treated with antibiotic medicines. These may be given as a pill, a vaginal cream, or a medicine that is put into the vagina (suppository). If the condition comes back after treatment, a second round of antibiotics may be needed. Follow these instructions at home: Medicines Take or apply over-the-counter and prescription medicines only as told by your health care provider. Take or apply your antibiotic medicine as told by  your health care provider. Do not stop using the antibiotic even if you start to feel better. General instructions If you have a female sexual partner, tell her that you have a vaginal infection. She should follow up with her health care provider. If you have a female sexual partner, he does not need treatment. Avoid sexual activity until you finish treatment. Drink enough fluid to keep your urine pale yellow. Keep the area around your vagina and rectum clean. Wash the area daily with warm water. Wipe yourself from front to back after using the toilet. If you are breastfeeding, talk to your health care provider about continuing breastfeeding during treatment. Keep all follow-up visits. This is important. How is this prevented? Self-care Do not douche. Wash the outside of your vagina with warm water only. Wear cotton or cotton-lined underwear. Avoid wearing tight pants and pantyhose, especially during the summer. Safe sex Use protection when having sex. This includes: Using condoms. Using dental dams. This  is a thin layer of a material made of latex or polyurethane that protects the mouth during oral sex. Limit the number of sexual partners. To help prevent bacterial vaginosis, it is best to have sex with just one partner (monogamous relationship). Make sure you and your sexual partner are tested for STIs. Drugs and alcohol Do not use any products that contain nicotine or tobacco. These products include cigarettes, chewing tobacco, and vaping devices, such as e-cigarettes. If you need help quitting, ask your health care provider. Do not use drugs. Do not drink alcohol if: Your health care provider tells you not to do this. You are pregnant, may be pregnant, or are planning to become pregnant. If you drink alcohol: Limit how much you have to 0-1 drink a day. Be aware of how much alcohol is in your drink. In the [place] one drink equals one 12 oz bottle of beer (355 mL), one 5 oz glass of wine  (148 mL), or one 1 oz glass of hard liquor (44 mL). Where to find more information Centers for Disease Control and Prevention: http://www.wolf.info/ American Sexual Health Association (ASHA): www.ashastd.org U.S. Department of Health and Financial controller, Office on Women's Health: [place]BeachSigns.tn Contact a health care provider if: Your symptoms do not improve, even after treatment. You have more discharge or pain when urinating. You have a fever or chills. You have pain in your abdomen or pelvis. You have pain during sex. You have vaginal bleeding between menstrual periods. Summary Bacterial vaginosis is a vaginal infection that occurs when the normal balance of bacteria in the vagina changes. It results from an overgrowth of certain bacteria. This condition increases the risk of sexually transmitted infections (STIs). Getting treated can help reduce this risk. Treatment is very important for pregnant women because this condition can cause babies to be born early (prematurely) or at low birth weight. This condition is treated with antibiotic medicines. These may be given as a pill, a vaginal cream, or a medicine that is put into the vagina (suppository). This information is not intended to replace advice given to you by your health care provider. Make sure you discuss any questions you have with your health care provider. Document Revised: 12/03/2019 Document Reviewed: 12/03/2019 Elsevier Patient Education  [employer].

## 2021-05-10 LAB — HIV ANTIBODY (ROUTINE TESTING W REFLEX): HIV 1&2 Ab, 4th Generation: NONREACTIVE

## 2021-05-10 LAB — SURESWAB CT/NG/T. VAGINALIS
C. trachomatis RNA, TMA: NOT DETECTED
N. gonorrhoeae RNA, TMA: NOT DETECTED
Trichomonas vaginalis RNA: NOT DETECTED

## 2021-05-10 LAB — RPR: RPR Ser Ql: NONREACTIVE

## 2021-05-14 LAB — TESTOS,TOTAL,FREE AND SHBG (FEMALE)
Free Testosterone: 3.6 pg/mL (ref 0.1–6.4)
Sex Hormone Binding: 32 nmol/L (ref 14–73)
Testosterone, Total, LC-MS-MS: 27 ng/dL (ref 2–45)

## 2021-07-12 ENCOUNTER — Other Ambulatory Visit: Payer: Self-pay

## 2021-07-12 ENCOUNTER — Ambulatory Visit (INDEPENDENT_AMBULATORY_CARE_PROVIDER_SITE_OTHER): Payer: 59 | Admitting: Nurse Practitioner

## 2021-07-12 ENCOUNTER — Encounter: Payer: Self-pay | Admitting: Nurse Practitioner

## 2021-07-12 VITALS — BP 118/76 | HR 65 | Resp 16 | Ht 66.5 in | Wt 261.0 lb

## 2021-07-12 DIAGNOSIS — Z1231 Encounter for screening mammogram for malignant neoplasm of breast: Secondary | ICD-10-CM | POA: Diagnosis not present

## 2021-07-12 DIAGNOSIS — F419 Anxiety disorder, unspecified: Secondary | ICD-10-CM | POA: Diagnosis not present

## 2021-07-12 DIAGNOSIS — Z139 Encounter for screening, unspecified: Secondary | ICD-10-CM

## 2021-07-12 DIAGNOSIS — F32A Depression, unspecified: Secondary | ICD-10-CM

## 2021-07-12 DIAGNOSIS — Z1211 Encounter for screening for malignant neoplasm of colon: Secondary | ICD-10-CM | POA: Diagnosis not present

## 2021-07-12 DIAGNOSIS — M7989 Other specified soft tissue disorders: Secondary | ICD-10-CM

## 2021-07-12 DIAGNOSIS — R600 Localized edema: Secondary | ICD-10-CM | POA: Insufficient documentation

## 2021-07-12 DIAGNOSIS — K219 Gastro-esophageal reflux disease without esophagitis: Secondary | ICD-10-CM

## 2021-07-12 DIAGNOSIS — E559 Vitamin D deficiency, unspecified: Secondary | ICD-10-CM | POA: Insufficient documentation

## 2021-07-12 DIAGNOSIS — R7303 Prediabetes: Secondary | ICD-10-CM

## 2021-07-12 DIAGNOSIS — R079 Chest pain, unspecified: Secondary | ICD-10-CM

## 2021-07-12 DIAGNOSIS — R0602 Shortness of breath: Secondary | ICD-10-CM

## 2021-07-12 MED ORDER — IBUPROFEN 400 MG PO TABS: 400.0000 mg | ORAL_TABLET | Freq: Four times a day (QID) | ORAL | 0 refills | Status: DC | PRN

## 2021-07-12 MED ORDER — PANTOPRAZOLE SODIUM 40 MG PO TBEC: 40.0000 mg | DELAYED_RELEASE_TABLET | Freq: Every day | ORAL | 3 refills | Status: DC

## 2021-07-12 NOTE — Patient Instructions (Signed)
Please take Protonix 40 mg daily for your acid reflux. Take ibuprofen as needed for your chest pain you have been referred to cardiology.  It is important that you exercise regularly at least 30 minutes 5 times a week.  Think about what you will eat, plan ahead. Choose " clean, green, fresh or frozen" over canned, processed or packaged foods which are more sugary, salty and fatty. 70 to 75% of food eaten should be vegetables and fruit. Three meals at set times with snacks allowed between meals, but they must be fruit or vegetables. Aim to eat over a 12 hour period , example 7 am to 7 pm, and STOP after  your last meal of the day. Drink water,generally about 64 ounces per day, no other drink is as healthy. Fruit juice is best enjoyed in a healthy way, by EATING the fruit.  Thanks for choosing [facility], we consider it a privelige to serve you.

## 2021-07-12 NOTE — Assessment & Plan Note (Addendum)
Takes vitamin D 1000 units daily. patient will get vITAMIN D labs done before next visit.

## 2021-07-12 NOTE — Assessment & Plan Note (Signed)
Wt Readings from Last 3 Encounters:  07/12/21 261 lb (118.4 kg)  05/09/21 259 lb (117.5 kg)  04/27/21 255 lb (115.7 kg)  Importance of healthy food choices with portion control discussed as well as eating regularly within 12  hour window.   The need to choose clean green food 50%-75% of time is discussed as well as make water the primary drink and set a goal for 64 ounces daily.  Patient reeducated about the importance of committment to minimum of 150 minutes of exercise per week.  Three meals at set times with snacks allowed between meals but they must be fruit or vegetable.   Aim to eat  over 12 hour period  for example 7 am to 7 pm. Stop after your last meal of the day.

## 2021-07-12 NOTE — Assessment & Plan Note (Signed)
EKG obtained today.  Shows sinus reading with PAC's heart rate 61 bpm.  Patient not in distress Take ibuprofen as needed for pain. Patient referred to cardiology to rule out cardiac origin.

## 2021-07-12 NOTE — Assessment & Plan Note (Signed)
Patient referred to cardiology.  EKG , SR with PAC'S

## 2021-07-12 NOTE — Assessment & Plan Note (Signed)
Lab Results  Component Value Date   HGBA1C 5.8 (H) 02/24/2021  loose weight, avoid sugar, sweet, soda.

## 2021-07-12 NOTE — Assessment & Plan Note (Signed)
Well controlled Not on medication currently.   Denies suicidal or ideation ideation.  PHQ 9 score 1.

## 2021-07-12 NOTE — Assessment & Plan Note (Signed)
RX.Protonix 40 mg daily.  Will refer patient to GI if her symptoms does not get better.

## 2021-07-12 NOTE — Assessment & Plan Note (Signed)
Patient denies pain.  Left leg mildly swollen, states she has varicose vein on the left leg.  Patient told to wear compression socks and keep leg elevated when sitting
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
## 2021-08-15 ENCOUNTER — Encounter: Payer: Self-pay | Admitting: Gastroenterology

## 2021-08-17 ENCOUNTER — Ambulatory Visit (INDEPENDENT_AMBULATORY_CARE_PROVIDER_SITE_OTHER): Payer: 59 | Admitting: Internal Medicine

## 2021-08-17 ENCOUNTER — Other Ambulatory Visit: Payer: Self-pay

## 2021-08-17 ENCOUNTER — Encounter: Payer: Self-pay | Admitting: Internal Medicine

## 2021-08-17 VITALS — BP 120/82 | HR 84 | Ht 66.5 in | Wt 258.0 lb

## 2021-08-17 DIAGNOSIS — R0602 Shortness of breath: Secondary | ICD-10-CM | POA: Diagnosis not present

## 2021-08-17 MED ORDER — FLUTICASONE PROPIONATE HFA 220 MCG/ACT IN AERO: 1.0000 | INHALATION_SPRAY | Freq: Two times a day (BID) | RESPIRATORY_TRACT | 12 refills | Status: DC

## 2021-08-17 NOTE — Patient Instructions (Signed)
Medication Instructions:  ?Start Flovent 220 mcg 2 Times Daily  ? ?*If you need a refill on your cardiac medications before your next appointment, please call your pharmacy* ? ? ?Lab Work: ?Your physician recommends that you return for lab work in: Today  ? ?If you have labs (blood work) drawn today and your tests are completely normal, you will receive your results only by: ?MyChart Message (if you have MyChart) OR ?A paper copy in the mail ?If you have any lab test that is abnormal or we need to change your treatment, we will call you to review the results. ? ? ?Testing/Procedures: ?Your physician has requested that you have an echocardiogram. Echocardiography is a painless test that uses sound waves to create images of your heart. It provides your doctor with information about the size and shape of your heart and how well your heart?s chambers and valves are working. This procedure takes approximately one hour. There are no restrictions for this procedure. ? ? ? ?Follow-Up: ?At [facility], you and your health needs are our priority.  As part of our continuing mission to provide you with exceptional heart care, we have created designated Provider Care Teams.  These Care Teams include your primary Cardiologist (physician) and Advanced Practice Providers (APPs -  Physician Assistants and Nurse Practitioners) who all work together to provide you with the care you need, when you need it. ? ?We recommend signing up for the patient portal called "MyChart".  Sign up information is provided on this After Visit Summary.  MyChart is used to connect with patients for Virtual Visits (Telemedicine).  Patients are able to view lab/test results, encounter notes, upcoming appointments, etc.  Non-urgent messages can be sent to your provider as well.   ?To learn more about what you can do with MyChart, go to [email or web address].   ? ?Your next appointment:   ? To Be Determined  ? ?The format for your next appointment:   ?In  Person ? ?Provider:   ?Dorris Carnes, MD  ? ? ?Other Instructions ?Thank you for choosing [facility]! ?  ? ?
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
## 2021-08-18 ENCOUNTER — Other Ambulatory Visit (HOSPITAL_COMMUNITY): Payer: 59

## 2021-08-18 ENCOUNTER — Other Ambulatory Visit: Payer: Self-pay | Admitting: Nurse Practitioner

## 2021-08-18 DIAGNOSIS — E559 Vitamin D deficiency, unspecified: Secondary | ICD-10-CM

## 2021-08-18 LAB — TSH: TSH: 1.59 u[IU]/mL (ref 0.450–4.500)

## 2021-08-18 LAB — CMP14+EGFR
ALT: 11 IU/L (ref 0–32)
AST: 15 IU/L (ref 0–40)
Albumin/Globulin Ratio: 1.8 (ref 1.2–2.2)
Albumin: 4.6 g/dL (ref 3.8–4.9)
Alkaline Phosphatase: 80 IU/L (ref 44–121)
BUN/Creatinine Ratio: 16 (ref 9–23)
BUN: 11 mg/dL (ref 6–24)
Bilirubin Total: 0.2 mg/dL (ref 0.0–1.2)
CO2: 25 mmol/L (ref 20–29)
Calcium: 9.5 mg/dL (ref 8.7–10.2)
Chloride: 100 mmol/L (ref 96–106)
Creatinine, Ser: 0.69 mg/dL (ref 0.57–1.00)
Globulin, Total: 2.6 g/dL (ref 1.5–4.5)
Glucose: 82 mg/dL (ref 70–99)
Potassium: 4.2 mmol/L (ref 3.5–5.2)
Sodium: 139 mmol/L (ref 134–144)
Total Protein: 7.2 g/dL (ref 6.0–8.5)
eGFR: 101 mL/min/{1.73_m2} (ref >59)

## 2021-08-18 LAB — LIPID PANEL
Chol/HDL Ratio: 3.3 ratio (ref 0.0–4.4)
Cholesterol, Total: 181 mg/dL (ref 100–199)
HDL: 55 mg/dL (ref >39)
LDL Chol Calc (NIH): 107 mg/dL — ABNORMAL HIGH (ref 0–99)
Triglycerides: 108 mg/dL (ref 0–149)
VLDL Cholesterol Cal: 19 mg/dL (ref 5–40)

## 2021-08-18 LAB — CBC
Hematocrit: 40.8 % (ref 34.0–46.6)
Hemoglobin: 13.7 g/dL (ref 11.1–15.9)
MCH: 31.2 pg (ref 26.6–33.0)
MCHC: 33.6 g/dL (ref 31.5–35.7)
MCV: 93 fL (ref 79–97)
Platelets: 227 10*3/uL (ref 150–450)
RBC: 4.39 x10E6/uL (ref 3.77–5.28)
RDW: 13.1 % (ref 11.7–15.4)
WBC: 6.2 10*3/uL (ref 3.4–10.8)

## 2021-08-18 LAB — HEMOGLOBIN A1C
Est. average glucose Bld gHb Est-mCnc: 120 mg/dL
Hgb A1c MFr Bld: 5.8 % — ABNORMAL HIGH (ref 4.8–5.6)

## 2021-08-18 LAB — VITAMIN D 25 HYDROXY (VIT D DEFICIENCY, FRACTURES): Vit D, 25-Hydroxy: 26.3 ng/mL — ABNORMAL LOW (ref 30.0–100.0)

## 2021-08-18 MED ORDER — VITAMIN D3 25 MCG (1000 UT) PO CAPS
1000.0000 [IU] | ORAL_CAPSULE | Freq: Every day | ORAL | 3 refills | Status: AC
Start: 2021-08-18 — End: ?

## 2021-08-18 NOTE — Progress Notes (Signed)
Please review results with patient LDL slightly elevated Eat a healthy diet, including lots of fruits and vegetables. Avoid foods with a lot of saturated and trans fats, such as red meat, butter, fried foods and cheese . Maintain a healthy weight. Vitamin D level is low, patient should start taking vitamin D 1000 units daily. Patient is prediabetic, avoid sugar sweets soda Other labs are normal.

## 2021-09-12 ENCOUNTER — Ambulatory Visit (HOSPITAL_COMMUNITY)
Admission: RE | Admit: 2021-09-12 | Discharge: 2021-09-12 | Disposition: A | Discharge status: Home / Self Care | Payer: 59 | Source: Ambulatory Visit | Attending: Internal Medicine | Admitting: Internal Medicine

## 2021-09-12 DIAGNOSIS — R0602 Shortness of breath: Secondary | ICD-10-CM | POA: Diagnosis present

## 2021-09-12 LAB — ECHOCARDIOGRAM COMPLETE
Area-P 1/2: 4.21 cm2
S' Lateral: 2.7 cm

## 2021-09-12 NOTE — Progress Notes (Signed)
*  PRELIMINARY RESULTS* ?Echocardiogram ?2D Echocardiogram has been performed. ? ?Adriana [name] ?09/12/2021, 9:18 AM ?

## 2021-09-20 ENCOUNTER — Telehealth: Payer: Self-pay

## 2021-09-20 NOTE — Telephone Encounter (Signed)
Echo results discussed with patient. She states she had cough for over a month but it has resolved. ? ?She says she has had SOB for years and lives in an older home.She wonders if mold could be an issue.She will probably pursue a mold evaluation in her home. ? ? ?I have copied pcp echo report and will FYI Dr.Ross as requested. ?

## 2021-09-20 NOTE — Telephone Encounter (Signed)
-----   Message from Fay Records, MD sent at 09/18/2021  9:56 PM EDT ----- ?Echo shows normal systolic function ?My reivew I think relaxing function is normal ?Does not explain SOB ?When I saw her in clinic, I thought about  cardiopulmonary stress testing, but she had significant coughing   Needs to quiet down to do  Please review with pt   ?

## 2021-11-10 ENCOUNTER — Encounter: Payer: 59 | Admitting: Nurse Practitioner

## 2021-12-08 ENCOUNTER — Encounter: Payer: 59 | Admitting: Nurse Practitioner

## 2022-02-08 ENCOUNTER — Ambulatory Visit (INDEPENDENT_AMBULATORY_CARE_PROVIDER_SITE_OTHER): Payer: 59 | Admitting: Internal Medicine

## 2022-02-08 ENCOUNTER — Encounter: Payer: Self-pay | Admitting: Internal Medicine

## 2022-02-08 DIAGNOSIS — G4700 Insomnia, unspecified: Secondary | ICD-10-CM | POA: Diagnosis not present

## 2022-02-08 DIAGNOSIS — M7918 Myalgia, other site: Secondary | ICD-10-CM | POA: Diagnosis not present

## 2022-02-08 NOTE — Assessment & Plan Note (Signed)
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  There is no pain elicited with ROM of her left hip.  FABER/FADIR testing is negative as well.  Logroll testing is negative.  However, pain is elicited with palpation over the left buttock.  We discussed treatment options, she is in agreement with conservatively beginning with home PT exercises and continuing heating/icing and Tylenol/anti-inflammatory use. -She has follow-up scheduled for 1 month, at which time we can consider a referral to formal PT or sports medicine if her symptoms have not improved.

## 2022-02-08 NOTE — Progress Notes (Signed)
Established Patient Office Visit  Subjective   Patient ID: Adriana [name], female    DOB: 04-May-1961  Age: 61 y.o. MRN: [id number]  Chief Complaint  Patient presents with   Hip Pain    Off and on for 2 months   Insomnia    Hasnt been able to sleep for a while    Ms. Adriana [name] is a 61 year old woman presenting for evaluation of left hip pain and insomnia today.  She is followed by Adriana Rua, NP at [facility].  Adriana [name] reports a several week history of left hip/buttock pain.  When asked to point to the location of pain she points to her left buttock.  Pain is worse with ambulation, particularly when walking up an incline or down a hill.  Her pain is relieved with rest.  She denies pain in the anterior or lateral aspects of her hip.  She denies any red flag symptoms of fever/chills, saddle anesthesia, bowel/bladder incontinence, and unintentional weight loss.  She further denies shooting pains in her left leg, left lower extremity weakness, or numbness/tingling in her left leg.  She has no pain in her left knee.  Adriana [name] would like to additionally discuss insomnia.  She states that she has not been able to sleep for quite some time despite trying multiple sleep aids.  She states that she is currently taking sleepy time tea and honey.  At one point she briefly tried melatonin, but states that she thought she remembered learning that melatonin was bad for her body so she stopped taking it.  Adriana [name] has trouble both falling and staying asleep.  She is often drinking liquids up until bedtime and is getting up multiple times nightly to urinate.  She is unsure if she snores at night or not.  Past Medical History:  Diagnosis Date   Anemia    history   Anxiety    Benign positional vertigo 12/19/2013   Depression    Dyspnea    with exertion   Edema    Endometrial polyp    Fibroids    GERD (gastroesophageal reflux disease)    Insomnia    Internal and external hemorrhoids without  complication    Migraine    Morbid obesity ([place])    Plantar fasciitis    PMB (postmenopausal bleeding)    Prediabetes    Rectal bleeding    Reflux    Past Surgical History:  Procedure Laterality Date   DILATATION & CURETTAGE/HYSTEROSCOPY WITH MYOSURE N/A 07/23/2017   Procedure: DILATATION & CURETTAGE/HYSTEROSCOPY WITH MYOSURE;  Surgeon: Salvadore Dom, MD;  Location: [facility];  Service: Gynecology;  Laterality: N/A;  endometrial polyp   DILATION AND CURETTAGE OF UTERUS     HYSTEROSCOPY     None     Social History   Tobacco Use   Smoking status: Never   Smokeless tobacco: Never  Vaping Use   Vaping Use: Never used  Substance Use Topics   Alcohol use: Yes    Comment: occ   Drug use: No   Family History  Problem Relation Age of Onset   Colon cancer Father        diagnosed in late 16s.    Hypertension Father    Heart attack Father    Esophageal cancer Neg Hx    Breast cancer Neg Hx    Allergies  Allergen Reactions   Hydrocodone Nausea And Vomiting    Other reaction(s): Unknown      Review  of Systems  Constitutional:  Negative for chills and fever.       Insomnia  HENT:  Negative for sore throat.   Respiratory:  Negative for cough and shortness of breath.   Cardiovascular:  Negative for chest pain, palpitations and leg swelling.  Gastrointestinal:  Negative for abdominal pain, blood in stool, constipation, diarrhea, nausea and vomiting.  Genitourinary:  Negative for dysuria and hematuria.  Musculoskeletal:  Positive for back pain and joint pain. Negative for myalgias.       Left hip/lower back pain  Skin:  Negative for itching and rash.  Neurological:  Negative for dizziness and headaches.  Psychiatric/Behavioral:  Negative for depression and suicidal ideas.       Objective:     BP 131/83   Pulse 91   Ht 5' 6.5" (1.689 m)   Wt 259 lb (117.5 kg)   LMP 01/16/2014   SpO2 95%   BMI 41.18 kg/m  BP Readings from Last 3 Encounters:   02/08/22 131/83  08/17/21 120/82  07/12/21 118/76      Physical Exam Constitutional:      General: She is not in acute distress.    Appearance: Normal appearance. She is obese. She is not toxic-appearing.  Cardiovascular:     Rate and Rhythm: Normal rate and regular rhythm.     Pulses: Normal pulses.     Heart sounds: Normal heart sounds. No murmur heard.    No friction rub. No gallop.  Pulmonary:     Effort: Pulmonary effort is normal.     Breath sounds: Normal breath sounds. No wheezing, rhonchi or rales.  Abdominal:     General: Abdomen is flat. Bowel sounds are normal. There is no distension.     Palpations: Abdomen is soft.     Tenderness: There is no abdominal tenderness.  Musculoskeletal:     Comments: Left hip/lower back examined.  Forward flexion, extension, and right lateral flexion at the waist are intact.  Left lateral movements and twisting forward and back are limited due to pain in the left buttock.  There is tenderness palpation over the left buttock.  ROM of the left hip is generally intact.  Strength in the left lower extremity is intact.  ROM of the left knee is intact as well.  Negative logroll.  Negative FABER/FADIR testing.    No results found for any visits on 02/08/22.  Last CBC Lab Results  Component Value Date   WBC 6.2 08/17/2021   HGB 13.7 08/17/2021   HCT 40.8 08/17/2021   MCV 93 08/17/2021   MCH 31.2 08/17/2021   RDW 13.1 08/17/2021   PLT 227 13/24/4010   Last metabolic panel Lab Results  Component Value Date   GLUCOSE 82 08/17/2021   NA 139 08/17/2021   K 4.2 08/17/2021   CL 100 08/17/2021   CO2 25 08/17/2021   BUN 11 08/17/2021   CREATININE 0.69 08/17/2021   EGFR 101 08/17/2021   CALCIUM 9.5 08/17/2021   PROT 7.2 08/17/2021   ALBUMIN 4.6 08/17/2021   LABGLOB 2.6 08/17/2021   AGRATIO 1.8 08/17/2021   BILITOT 0.2 08/17/2021   ALKPHOS 80 08/17/2021   AST 15 08/17/2021   ALT 11 08/17/2021   ANIONGAP 8 02/12/2019   Last  lipids Lab Results  Component Value Date   CHOL 181 08/17/2021   HDL 55 08/17/2021   LDLCALC 107 (H) 08/17/2021   TRIG 108 08/17/2021   CHOLHDL 3.3 08/17/2021   Last hemoglobin A1c Lab Results  Component Value Date   HGBA1C 5.8 (H) 08/17/2021   Last thyroid functions Lab Results  Component Value Date   TSH 1.590 08/17/2021   T4TOTAL 7.6 02/01/2016   Last vitamin D Lab Results  Component Value Date   VD25OH 26.3 (L) 08/17/2021    The 10-year ASCVD risk score (Arnett DK, et al., 2019) is: 2.9%    Assessment & Plan:   Problem List Items Addressed This Visit       Other   Left buttock pain    She is presenting for evaluation of what she states is left hip pain, however it seems more consistent with pain in her left buttock, 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  There is no pain elicited with ROM of her left hip.  FABER/FADIR testing is negative as well.  Logroll testing is negative.  However, pain is elicited with palpation over the left buttock.  We discussed treatment options, she is in agreement with conservatively beginning with home PT exercises and continuing heating/icing and Tylenol/anti-inflammatory use. -She has follow-up scheduled for 1 month, at which time we can consider a referral to formal PT or sports medicine if her symptoms have not improved.      Insomnia    She endorses intermittent insomnia over the last 2 months.  She endorses difficulty both falling and staying asleep.  She is currently using honey and sleepy time tea.  We reviewed proper sleep hygiene measures and I provided her with an educational handout.  We also discussed proper use of melatonin, specifically the need to take the medication several hours prior to bedtime in order for her to have the greatest chance of positive affect.  If there is no improvement in her symptoms at follow-up in one month, we can consider starting a low-dose prescription medication for sleep.  If  also be reasonable to consider referring her for a sleep study to screen for OSA.       Return if symptoms worsen or fail to improve.    Johnette Abraham, MD

## 2022-02-08 NOTE — Assessment & Plan Note (Addendum)
She endorses intermittent insomnia over the last 2 months.  She endorses difficulty both falling and staying asleep.  She is currently using honey and sleepy time tea.  We reviewed proper sleep hygiene measures and I provided her with an educational handout.  We also discussed proper use of melatonin, specifically the need to take the medication several hours prior to bedtime in order for her to have the greatest chance of positive affect.  If there is no improvement in her symptoms at follow-up in one month, we can consider starting a low-dose prescription medication for sleep.  If also be reasonable to consider referring her for a sleep study to screen for OSA.

## 2022-02-08 NOTE — Patient Instructions (Signed)
It was a pleasure to see you today.  Thank you for giving [place] the opportunity to be involved in your care.  Below is a brief recap of your visit and next steps.  We will plan to see you again in 1 month for your physical.  Summary I have added home exercises for your low back pain. Please perform these daily in addition to antiinflammatories and heating / icing as needed. I have also added information on appropriate sleep hygiene and I recommend using melatonin as we discussed.  Next steps You have follow up with [facility] scheduled for one month. Please let us know if your symptoms are not improving.

## 2022-03-08 ENCOUNTER — Encounter: Payer: Self-pay | Admitting: Nurse Practitioner

## 2022-03-08 ENCOUNTER — Ambulatory Visit (INDEPENDENT_AMBULATORY_CARE_PROVIDER_SITE_OTHER): Payer: 59 | Admitting: Nurse Practitioner

## 2022-03-08 ENCOUNTER — Ambulatory Visit: Payer: 59 | Admitting: Podiatry

## 2022-03-08 VITALS — BP 138/80 | HR 82 | Ht 66.0 in | Wt 258.0 lb

## 2022-03-08 DIAGNOSIS — R600 Localized edema: Secondary | ICD-10-CM

## 2022-03-08 DIAGNOSIS — E559 Vitamin D deficiency, unspecified: Secondary | ICD-10-CM

## 2022-03-08 DIAGNOSIS — F32A Depression, unspecified: Secondary | ICD-10-CM

## 2022-03-08 DIAGNOSIS — Z2821 Immunization not carried out because of patient refusal: Secondary | ICD-10-CM

## 2022-03-08 DIAGNOSIS — M7661 Achilles tendinitis, right leg: Secondary | ICD-10-CM

## 2022-03-08 DIAGNOSIS — Z1211 Encounter for screening for malignant neoplasm of colon: Secondary | ICD-10-CM

## 2022-03-08 DIAGNOSIS — R7303 Prediabetes: Secondary | ICD-10-CM

## 2022-03-08 DIAGNOSIS — M76821 Posterior tibial tendinitis, right leg: Secondary | ICD-10-CM

## 2022-03-08 DIAGNOSIS — M7989 Other specified soft tissue disorders: Secondary | ICD-10-CM

## 2022-03-08 DIAGNOSIS — R0602 Shortness of breath: Secondary | ICD-10-CM

## 2022-03-08 DIAGNOSIS — F419 Anxiety disorder, unspecified: Secondary | ICD-10-CM | POA: Diagnosis not present

## 2022-03-08 DIAGNOSIS — Z23 Encounter for immunization: Secondary | ICD-10-CM

## 2022-03-08 DIAGNOSIS — Z6841 Body Mass Index (BMI) 40.0 and over, adult: Secondary | ICD-10-CM

## 2022-03-08 DIAGNOSIS — B351 Tinea unguium: Secondary | ICD-10-CM

## 2022-03-08 DIAGNOSIS — M7918 Myalgia, other site: Secondary | ICD-10-CM

## 2022-03-08 MED ORDER — ESCITALOPRAM OXALATE 10 MG PO TABS: 10.0000 mg | ORAL_TABLET | Freq: Every day | ORAL | 0 refills | Status: DC

## 2022-03-08 MED ORDER — METHYLPREDNISOLONE 4 MG PO TBPK: ORAL_TABLET | ORAL | 0 refills | Status: DC

## 2022-03-08 MED ORDER — MELOXICAM 15 MG PO TABS: 15.0000 mg | ORAL_TABLET | Freq: Every day | ORAL | 0 refills | Status: DC

## 2022-03-08 MED ORDER — CICLOPIROX 8 % EX SOLN: Freq: Every day | CUTANEOUS | 0 refills | Status: DC

## 2022-03-08 MED ORDER — CYCLOBENZAPRINE HCL 5 MG PO TABS: 5.0000 mg | ORAL_TABLET | Freq: Three times a day (TID) | ORAL | 0 refills | Status: DC | PRN

## 2022-03-08 NOTE — Assessment & Plan Note (Signed)
Patient educated on CDC recommendation for the varicella vaccine. Verbal consent was obtained from the patient, vaccine administered by nurse, no sign of adverse reactions noted at this time. Patient education on arm soreness and use of tylenol   for this patient  was discussed. Patient educated on the signs and symptoms of adverse effect and advise to contact the office if they occur. Vaccine information sheet given to patient.

## 2022-03-08 NOTE — Patient Instructions (Addendum)
Please schedule your mammogram today   Please take flexeril '5mg'$  three times daily for your pain , tylenol '650mg'$  every 6 hours as needed for pain. Get your xray of your hip done as dicussed   Please get your shingles vaccine today    It is important that you exercise regularly at least 30 minutes 5 times a week.  Think about what you will eat, plan ahead. Choose " clean, green, fresh or frozen" over canned, processed or packaged foods which are more sugary, salty and fatty. 70 to 75% of food eaten should be vegetables and fruit. Three meals at set times with snacks allowed between meals, but they must be fruit or vegetables. Aim to eat over a 12 hour period , example 7 am to 7 pm, and STOP after  your last meal of the day. Drink water,generally about 64 ounces per day, no other drink is as healthy. Fruit juice is best enjoyed in a healthy way, by EATING the fruit.  Thanks for choosing [facility], we consider it a privelige to serve you.

## 2022-03-08 NOTE — Assessment & Plan Note (Signed)
Chronic condition  Her weight might be contributing to this Patient encouraged to lose weight She was evaluated by cardiology recent  echocardiogram was normal

## 2022-03-08 NOTE — Progress Notes (Signed)
Established Patient Office Visit  Subjective:  Patient ID: Adriana [name], female    DOB: 1961/03/13  Age: 61 y.o. MRN: [id number]  CC:  Chief Complaint  Patient presents with   Annual Exam    cpe   Back Pain    Lower back pain left side since around 02/05/22    HPI Adriana [name] is a 61 y.o. female with past medical history of anxiety and depression, obesity, edema, GERD, prediabetes who presents for follow-up for her chronic medical conditions.   Anxiety and depression .     Patient stated that she has been on medications in the past including Prozac and Celexa , she has tried not to take medications due to side effects of medications .  Thinks her depression and anxiety is due to the loss of her job , had a boss that bullied her in the past , she is also  divorced. She denies SI, HI.     Still having left buttock pain, pain is worse with movement, denies numbness tingling,  incontinence, trauma, fever, chills, swelling.  she has not taking any medication for her pain   shingles vaccine given in the office today.  Due for Pap exam patient will get her Pap exam done with OB/GYN in November   Referral Sent for colonoscopy and mammogram today  Past Medical History:  Diagnosis Date   Anemia    history   Anxiety    Benign positional vertigo 12/19/2013   Depression    Dyspnea    with exertion   Edema    Endometrial polyp    Fibroids    GERD (gastroesophageal reflux disease)    Insomnia    Internal and external hemorrhoids without complication    Migraine    Morbid obesity ([place])    Plantar fasciitis    PMB (postmenopausal bleeding)    Prediabetes    Rectal bleeding    Reflux     Past Surgical History:  Procedure Laterality Date   DILATATION & CURETTAGE/HYSTEROSCOPY WITH MYOSURE N/A 07/23/2017   Procedure: DILATATION & CURETTAGE/HYSTEROSCOPY WITH MYOSURE;  Surgeon: Salvadore Dom, MD;  Location: [place];  Service: Gynecology;   Laterality: N/A;  endometrial polyp   DILATION AND CURETTAGE OF UTERUS     HYSTEROSCOPY     None      Family History  Problem Relation Age of Onset   Colon cancer Father        diagnosed in late 47s.    Hypertension Father    Heart attack Father    Esophageal cancer Neg Hx    Breast cancer Neg Hx    Cervical cancer Neg Hx     Social History   Socioeconomic History   Marital status: Divorced    Spouse name: Not on file   Number of children: 1   Years of education: Not on file   Highest education level: Not on file  Occupational History   Occupation: Counsellor: Dance movement psychotherapist  Tobacco Use   Smoking status: Never   Smokeless tobacco: Never  Scientific laboratory technician Use: Never used  Substance and Sexual Activity   Alcohol use: Yes    Comment: occ   Drug use: No   Sexual activity: Yes    Partners: Male    Birth control/protection: Post-menopausal  Other Topics Concern   Not on file  Social History Narrative   Lives with her daughter.  Social Determinants of Health   Financial Resource Strain: Not on file  Food Insecurity: Not on file  Transportation Needs: Not on file  Physical Activity: Not on file  Stress: Not on file  Social Connections: Not on file  Intimate Partner Violence: Not on file    Outpatient Medications Prior to Visit  Medication Sig Dispense Refill   Cholecalciferol (VITAMIN D3) 25 MCG (1000 UT) CAPS Take 1 capsule (1,000 Units total) by mouth daily. 60 capsule 3   Magnesium 400 MG TABS Take 1 tablet by mouth as needed.     MELATONIN PO Take by mouth. As needed for sleep     No facility-administered medications prior to visit.    Allergies  Allergen Reactions   Hydrocodone Nausea And Vomiting    Other reaction(s): Unknown    ROS Review of Systems  Constitutional: Negative.  Negative for activity change, appetite change and chills.  Respiratory:  Positive for shortness of breath. Negative for apnea, choking  and chest tightness.   Cardiovascular:  Positive for leg swelling. Negative for palpitations.  Gastrointestinal: Negative.  Negative for abdominal distention, abdominal pain and anal bleeding.  Genitourinary: Negative.  Negative for flank pain and frequency.  Neurological: Negative.  Negative for dizziness, facial asymmetry, light-headedness and headaches.  Psychiatric/Behavioral:  Negative for agitation, behavioral problems, confusion, decreased concentration, dysphoric mood, self-injury, sleep disturbance and suicidal ideas. The patient is nervous/anxious.       Objective:    Physical Exam Constitutional:      General: She is not in acute distress.    Appearance: She is obese. She is not ill-appearing, toxic-appearing or diaphoretic.  Cardiovascular:     Rate and Rhythm: Normal rate and regular rhythm.     Pulses: Normal pulses.     Heart sounds: Normal heart sounds. No murmur heard.    No friction rub. No gallop.  Pulmonary:     Effort: Pulmonary effort is normal. No respiratory distress.     Breath sounds: Normal breath sounds. No stridor. No wheezing, rhonchi or rales.  Chest:     Chest wall: No tenderness.  Abdominal:     Palpations: Abdomen is soft.     Tenderness: There is no abdominal tenderness.  Musculoskeletal:        General: Tenderness present. No deformity or signs of injury.     Right lower leg: Edema present.     Left lower leg: Edema present.     Comments: Tenderness on palpation of left buttock  Skin:    General: Skin is warm and dry.     Capillary Refill: Capillary refill takes less than 2 seconds.     Coloration: Skin is not jaundiced or pale.     Findings: No bruising, erythema or lesion.  Neurological:     Mental Status: She is alert and oriented to person, place, and time.     Cranial Nerves: No cranial nerve deficit.     Motor: No weakness.     Gait: Gait normal.  Psychiatric:        Mood and Affect: Mood normal.        Behavior: Behavior normal.         Thought Content: Thought content normal.        Judgment: Judgment normal.     BP 138/80 (BP Location: Left Arm, Patient Position: Sitting, Cuff Size: Large)   Pulse 82   Ht 5' 6"  (1.676 m)   Wt 258 lb (117 kg)   LMP  01/16/2014   SpO2 97%   BMI 41.64 kg/m  Wt Readings from Last 3 Encounters:  03/08/22 258 lb (117 kg)  02/08/22 259 lb (117.5 kg)  08/17/21 258 lb (117 kg)    Lab Results  Component Value Date   TSH 1.590 08/17/2021   Lab Results  Component Value Date   WBC 6.2 08/17/2021   HGB 13.7 08/17/2021   HCT 40.8 08/17/2021   MCV 93 08/17/2021   PLT 227 08/17/2021   Lab Results  Component Value Date   NA 139 08/17/2021   K 4.2 08/17/2021   CO2 25 08/17/2021   GLUCOSE 82 08/17/2021   BUN 11 08/17/2021   CREATININE 0.69 08/17/2021   BILITOT 0.2 08/17/2021   ALKPHOS 80 08/17/2021   AST 15 08/17/2021   ALT 11 08/17/2021   PROT 7.2 08/17/2021   ALBUMIN 4.6 08/17/2021   CALCIUM 9.5 08/17/2021   ANIONGAP 8 02/12/2019   EGFR 101 08/17/2021   Lab Results  Component Value Date   CHOL 181 08/17/2021   Lab Results  Component Value Date   HDL 55 08/17/2021   Lab Results  Component Value Date   LDLCALC 107 (H) 08/17/2021   Lab Results  Component Value Date   TRIG 108 08/17/2021   Lab Results  Component Value Date   CHOLHDL 3.3 08/17/2021   Lab Results  Component Value Date   HGBA1C 5.8 (H) 08/17/2021      Assessment & Plan:   Problem List Items Addressed This Visit       Other   DYSPNEA    Chronic condition  Her weight might be contributing to this Patient encouraged to lose weight She was evaluated by cardiology recent  echocardiogram was normal      Anxiety and depression - Primary    PHQ-9 score 15 Denies SI, HI Patient agrees to starting Lexapro 10 mg daily today medication ordered Patient referred for counseling Follow-up in 6 weeks      Relevant Medications   escitalopram (LEXAPRO) 10 MG tablet   Other Relevant  Orders   AMB Referral to Chronic Care Management Services   Obesity    Wt Readings from Last 3 Encounters:  03/08/22 258 lb (117 kg)  02/08/22 259 lb (117.5 kg)  08/17/21 258 lb (117 kg)  She does walking exercise daily.  Need to increase intake of whole food consisting mainly vegetables, protein less carbohydrate drinking at least 64 ounces of water daily engaging in regular moderate to vigorous exercises at least 150 minutes discussed with the patient today.  Important of portion control also discussed. Patient is interested in starting [facility] .patient will check with her insurance company to see if they cover [employer]. Benefits of healthy weight discussed with the patient today she verbalized understanding      Prediabetes    Lab Results  Component Value Date   HGBA1C 5.8 (H) 08/17/2021  Check A1c Avoid sugar sweets soda Lose weight      Relevant Orders   HgB A1c   Bilateral lower extremity edema    Nonpitting edema to bilateral lower extremities  Varicose veins was noted on examination  She would like a referral to vascular surgery to further evaluate her lower extremity edema Patient encouraged to wear compression socks, keep legs elevated when sitting, to help decrease her leg swelling      Screening for colon cancer   Relevant Orders   Ambulatory referral to Gastroenterology   Vitamin D deficiency  Sometimes forgets to take her vitamin D supplement Check vitamin D levels today Last vitamin D Lab Results  Component Value Date   VD25OH 26.3 (L) 08/17/2021        Relevant Orders   Vitamin D (25 hydroxy)   Left buttock pain    Still having left buttock pain She has not taken any medication because she does not like to take medications Patient encouraged to take Flexeril 5 mg 3 times daily as needed, Tylenol 650 mg every 6 hours as needed for her  pain X-ray of the hip ordered      Relevant Medications   cyclobenzaprine (FLEXERIL) 5 MG tablet   Other Relevant  Orders   DG Hip Unilat W OR W/O Pelvis 2-3 Views Left   Refused influenza vaccine   Need for varicella vaccine    Patient educated on CDC recommendation for the varicella vaccine. Verbal consent was obtained from the patient, vaccine administered by nurse, no sign of adverse reactions noted at this time. Patient education on arm soreness and use of tylenol   for this patient  was discussed. Patient educated on the signs and symptoms of adverse effect and advise to contact the office if they occur. Vaccine information sheet given to patient.       Relevant Orders   Varicella-zoster vaccine IM (Completed)    Meds ordered this encounter  Medications   cyclobenzaprine (FLEXERIL) 5 MG tablet    Sig: Take 1 tablet (5 mg total) by mouth 3 (three) times daily as needed for muscle spasms.    Dispense:  30 tablet    Refill:  0   escitalopram (LEXAPRO) 10 MG tablet    Sig: Take 1 tablet (10 mg total) by mouth daily.    Dispense:  90 tablet    Refill:  0    Follow-up: Return in about 6 months (around 09/06/2022) for prediabetes, obesity.Renee Rival, FNP

## 2022-03-08 NOTE — Assessment & Plan Note (Signed)
Nonpitting edema to bilateral lower extremities  Varicose veins was noted on examination  She would like a referral to vascular surgery to further evaluate her lower extremity edema Patient encouraged to wear compression socks, keep legs elevated when sitting, to help decrease her leg swelling

## 2022-03-08 NOTE — Assessment & Plan Note (Signed)
Sometimes forgets to take her vitamin D supplement Check vitamin D levels today Last vitamin D Lab Results  Component Value Date   VD25OH 26.3 (L) 08/17/2021

## 2022-03-08 NOTE — Assessment & Plan Note (Signed)
PHQ-9 score 15 Denies SI, HI Patient agrees to starting Lexapro 10 mg daily today medication ordered Patient referred for counseling Follow-up in 6 weeks

## 2022-03-08 NOTE — Progress Notes (Signed)
Subjective:  Patient ID: Adriana [name], female    DOB: Dec 14, 1960,  MRN: [id number]  Chief Complaint  Patient presents with   Foot Problem     R ft blue on side of ankle and ft - hot to touch, foot fungus on the left foot, 4th digit    61 y.o. female presents with patient states that she has pain and some swelling to the right foot and ankle.  This has been present for weeks.  Recently got worse with some Bruising on the inside of her ankle but this has gone away.  She denies any recent injury that she can recall.  She does have pain at the back of the heel as well as the inside of the ankle.  She has not tried much yet to improve this other than some icing and anti-inflammatory medications like ibuprofen which has helped slightly.  Past Medical History:  Diagnosis Date   Anemia    history   Anxiety    Benign positional vertigo 12/19/2013   Depression    Dyspnea    with exertion   Edema    Endometrial polyp    Fibroids    GERD (gastroesophageal reflux disease)    Insomnia    Internal and external hemorrhoids without complication    Migraine    Morbid obesity ([facility])    Plantar fasciitis    PMB (postmenopausal bleeding)    Prediabetes    Rectal bleeding    Reflux     Allergies  Allergen Reactions   Hydrocodone Nausea And Vomiting    Other reaction(s): Unknown    ROS: Negative except as per HPI above  Objective:  General: AAO x3, NAD  Dermatological: With inspection and palpation of the right and left lower extremities there are no open sores, no preulcerative lesions, no rash or signs of infection present.  Nails with thickening and discoloration bilaterally.  Vascular:  Dorsalis Pedis artery and Posterior Tibial artery pedal pulses are 2/4 bilateral.  Capillary fill time brisk < 3 sec. Pedal hair growth present. No varicosities and no lower extremity edema present bilateral. There is no pain with calf compression, swelling, warmth, erythema.   Neruologic: Grossly  intact via light touch bilateral. Protective threshold intact to all sites bilateral. Patellar and Achilles deep tendon reflexes 2+ bilateral. Negative Babinski reflex.   Musculoskeletal: Pain with palpation at the posterior aspect of the right heel at the insertion of the Achilles tendon.  Also some mild edema and pain along the course the posterior tibial tendon of the right ankle.  Pain increased with inversion of the foot.  Gait: Unassisted, Nonantalgic.   No images are attached to the encounter.  Radiographs:  Deferred at this visit. Assessment:   1. Achilles tendinitis, right leg   2. 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 Onychomycosis      Plan:  Patient was evaluated and treated and all questions answered.  #Insertional Achilles tendinitis right foot. #Posterior tibial tendinitis and PTTD right lower extremity -Recommend we proceed with anti-inflammatory therapy and physical therapy. -Prescribed a methylprednisone 4 mg Dosepak for 6 days take as directed. -Antiinflammatory therapy including meloxicam 15 mg tablet take once daily as needed for pain -Referral to physical therapy  Onychomycosis -Educated on etiology of nail fungus. -eRx for Penlac 8% sent to the patient's pharmacy, apply daily for 3 to 6 months.   Return in about 6 weeks (around 04/19/2022) for PTTD and achilles tendinitis right.  Everitt Amber, DPM [facility]

## 2022-03-08 NOTE — Assessment & Plan Note (Signed)
Lab Results  Component Value Date   HGBA1C 5.8 (H) 08/17/2021  Check A1c Avoid sugar sweets soda Lose weight

## 2022-03-08 NOTE — Assessment & Plan Note (Signed)
Still having left buttock pain She has not taken any medication because she does not like to take medications Patient encouraged to take Flexeril 5 mg 3 times daily as needed, Tylenol 650 mg every 6 hours as needed for her  pain X-ray of the hip ordered

## 2022-03-08 NOTE — Assessment & Plan Note (Addendum)
Wt Readings from Last 3 Encounters:  03/08/22 258 lb (117 kg)  02/08/22 259 lb (117.5 kg)  08/17/21 258 lb (117 kg)  She does walking exercise daily.  Need to increase intake of whole food consisting mainly vegetables, protein less carbohydrate drinking at least 64 ounces of water daily engaging in regular moderate to vigorous exercises at least 150 minutes discussed with the patient today.  Important of portion control also discussed. Patient is interested in starting [facility] .patient will check with her insurance company to see if they cover [employer]. Benefits of healthy weight discussed with the patient today she verbalized understanding

## 2022-03-14 ENCOUNTER — Telehealth: Payer: Self-pay | Admitting: *Deleted

## 2022-03-14 NOTE — Chronic Care Management (AMB) (Signed)
  Care Management   Outreach Note  03/14/2022 Name: MIKIALA FUGETT MRN: [id number] DOB: 1961/05/11  An unsuccessful telephone outreach was attempted today. The patient was referred to the case management team for assistance with care management and care coordination.   Follow Up Plan:  A HIPAA compliant phone message was left for the patient providing contact information and requesting a return call.   Julian Hy, [place] Direct Dial: [phone]

## 2022-03-19 NOTE — Chronic Care Management (AMB) (Signed)
  Care Coordination   Note   03/19/2022 Name: JACQUELENE KOPECKY MRN: [id number] DOB: 1960/12/14  Breta G Lamarre is a 61 y.o. year old female who sees Alvira Monday, [place] for primary care. I reached out to [employer] by phone today to offer care coordination services.  Ms. Sylva was given information about Care Coordination services today including:   The Care Coordination services include support from the care team which includes your Nurse Coordinator, Clinical Social Worker, or Pharmacist.  The Care Coordination team is here to help remove barriers to the health concerns and goals most important to you. Care Coordination services are voluntary, and the patient may decline or stop services at any time by request to their care team member.   Care Coordination Consent Status: Patient agreed to services and verbal consent obtained.   Follow up plan:  Telephone appointment with care coordination team member scheduled for:  03/27/2022  Encounter Outcome:  Pt. Scheduled from referral   Julian Hy, [place] Direct Dial: [phone]

## 2022-03-27 ENCOUNTER — Encounter: Payer: Self-pay | Admitting: *Deleted

## 2022-03-27 ENCOUNTER — Ambulatory Visit: Payer: Self-pay | Admitting: *Deleted

## 2022-03-27 NOTE — Patient Outreach (Signed)
  Care Coordination   Initial Visit Note   03/27/2022  Name: Adriana [name] MRN: [id number] DOB: 1960/12/26  Adriana [name] is a 61 y.o. year old female who sees Alvira Monday, [place] for primary care. I spoke with Adriana [name] by phone today.  What matters to the patients health and wellness today?  Reduce and Manage Symptoms of Anxiety and Depression.    Goals Addressed               This Visit's Progress     Reduce and Manage Symptoms of Anxiety and Depression. (pt-stated)   On track     Care Coordination Interventions:  Assessed Social Determinant of Health Barriers. Provided Education to Patient About Advanced Directives Completion - Not Interested. Discussed Plans with Patient for Ongoing Care Management Follow Up. Provided Patient with Direct Contact Information for Care Management Team. Screening for Signs and Symptoms of Depression Related to Chronic Disease State.  LGX2/JJH4 Depression Screen Completed & Results Reviewed with Patient.  Suicidal Ideation/Homicidal Ideation Assessed - None Present. Solution-Focused Strategies Employed. Deep Breathing Exercises, Relaxation Techniques & Mindfulness Meditation Strategies Taught & Encouraged Daily.   Active Listening/Reflection Utilized.  Emotional Support Provided. Verbalization of Feelings Encouraged.  Problem Solving/Task Centered Solutions Developed.   Provided Psychoeducation for Mental Health Concerns. Reviewed Mental Health Medications & Discussed Importance of Compliance. Quality of Sleep Assessed & Sleep Hygiene Techniques Promoted. Participation in Counseling Encouraged. Discussed Referral to Psychiatrist for Psychotropic Medication Management. Discussed Referral to Therapist for Psychotherapeutic Counseling Services.          SDOH assessments and interventions completed:  Yes.  SDOH Interventions Today    Flowsheet Row Most Recent Value  SDOH Interventions   Food Insecurity  Interventions Intervention Not Indicated  Housing Interventions Intervention Not Indicated  Transportation Interventions Intervention Not Indicated  Utilities Interventions Intervention Not Indicated  Alcohol Usage Interventions Intervention Not Indicated (Score <7)  Depression Interventions/Treatment  Referral to Psychiatry, Counseling  Financial Strain Interventions Intervention Not Indicated  Physical Activity Interventions Patient Refused  Stress Interventions Offered [employer], Provide Counseling  Social Connections Interventions Intervention Not Indicated     SDOH assessments and interventions completed:  Yes.   Care Coordination Interventions Activated:  Yes.    Care Coordination Interventions:  Yes, provided.    Follow up plan: Follow up call scheduled for 04/10/2022 at 12:45 pm.   Encounter Outcome:  Pt. Visit Completed.    [name], BSW, MSW, LCSW  Licensed Education officer, [facility]  Mailing [place] [address], [place], [place] [address] Physical Address-[address] [place], [place] [address] Toll Free Main # [phone] Fax # [phone] Cell # [phone] [email or web address]

## 2022-03-27 NOTE — Patient Instructions (Signed)
Visit Information  Thank you for taking time to visit with me today. Please don't hesitate to contact me if I can be of assistance to you.   Following are the goals we discussed today:   Goals Addressed               This Visit's Progress     Reduce and Manage Symptoms of Anxiety and Depression. (pt-stated)   On track     Care Coordination Interventions:  Assessed Social Determinant of Health Barriers. Provided Education to Patient About Advanced Directives Completion - Not Interested. Discussed Plans with Patient for Ongoing Care Management Follow Up. Provided Patient with Direct Contact Information for Care Management Team. Screening for Signs and Symptoms of Depression Related to Chronic Disease State.  HDQ2/IWL7 Depression Screen Completed & Results Reviewed with Patient.  Suicidal Ideation/Homicidal Ideation Assessed - None Present. Solution-Focused Strategies Employed. Deep Breathing Exercises, Relaxation Techniques & Mindfulness Meditation Strategies Taught & Encouraged Daily.   Active Listening/Reflection Utilized.  Emotional Support Provided. Verbalization of Feelings Encouraged.  Problem Solving/Task Centered Solutions Developed.   Provided Psychoeducation for Mental Health Concerns. Reviewed Mental Health Medications & Discussed Importance of Compliance. Quality of Sleep Assessed & Sleep Hygiene Techniques Promoted. Participation in Counseling Encouraged. Discussed Referral to Psychiatrist for Psychotropic Medication Management. Discussed Referral to Therapist for Psychotherapeutic Counseling Services.          Our next appointment is by telephone on 04/10/2022 at 12:45 pm.  Please call the care guide team at [phone] if you need to cancel or reschedule your appointment.   If you are experiencing a Mental Health or [place] or need someone to talk to, please call the Suicide and Crisis Lifeline: 988 call the [place] National Suicide Prevention  Lifeline: 252-261-6721 or TTY: (343)595-6545 TTY 6105949474) to talk to a trained counselor call 1-800-273-TALK (toll free, 24 hour hotline) go to [facility] [address], [place] [phone]) call the [place]: [phone] call 911  Patient verbalizes understanding of instructions and care plan provided today and agrees to view in [place]. Active MyChart status and patient understanding of how to access instructions and care plan via MyChart confirmed with patient.     Telephone follow up appointment with care management team member scheduled for: 04/10/2022 at 12:45 pm.  [name], BSW, MSW, [place]  Licensed Clinical Social Worker  [place]  Mailing [place]. [address], [place], [place] [address] Physical Address-[address] [place], [place] [address] Toll Free Main # [phone] Fax # [phone] Cell # [phone] [email or web address]

## 2022-04-05 ENCOUNTER — Ambulatory Visit (HOSPITAL_COMMUNITY): Payer: 59 | Attending: Podiatry | Admitting: Physical Therapy

## 2022-04-05 ENCOUNTER — Encounter (HOSPITAL_COMMUNITY): Payer: Self-pay | Admitting: Physical Therapy

## 2022-04-05 DIAGNOSIS — M76821 Posterior tibial tendinitis, right leg: Secondary | ICD-10-CM | POA: Diagnosis not present

## 2022-04-05 DIAGNOSIS — M25571 Pain in right ankle and joints of right foot: Secondary | ICD-10-CM | POA: Diagnosis present

## 2022-04-05 DIAGNOSIS — M7661 Achilles tendinitis, right leg: Secondary | ICD-10-CM | POA: Diagnosis not present

## 2022-04-05 NOTE — Therapy (Signed)
OUTPATIENT PHYSICAL THERAPY LOWER EXTREMITY EVALUATION   Patient Name: Adriana [name] MRN: [id number] DOB:[birth year], 61 y.o., female Today's Date: 04/05/2022   PT End of Session - 04/05/22 1513     Visit Number 1    Number of Visits 12    Date for PT Re-Evaluation 05/17/22    Authorization Type [employer]    Progress Note Due on Visit 10    PT Start Time 1430    PT Stop Time 1513    PT Time Calculation (min) 43 min    Activity Tolerance Patient tolerated treatment well    Behavior During Therapy [facility] for tasks assessed/performed             Past Medical History:  Diagnosis Date   Anemia    history   Anxiety    Benign positional vertigo 12/19/2013   Depression    Dyspnea    with exertion   Edema    Endometrial polyp    Fibroids    GERD (gastroesophageal reflux disease)    Insomnia    Internal and external hemorrhoids without complication    Migraine    Morbid obesity ([place])    Plantar fasciitis    PMB (postmenopausal bleeding)    Prediabetes    Rectal bleeding    Reflux    Past Surgical History:  Procedure Laterality Date   DILATATION & CURETTAGE/HYSTEROSCOPY WITH MYOSURE N/A 07/23/2017   Procedure: DILATATION & CURETTAGE/HYSTEROSCOPY WITH MYOSURE;  Surgeon: Salvadore Dom, MD;  Location: [place];  Service: Gynecology;  Laterality: N/A;  endometrial polyp   DILATION AND CURETTAGE OF UTERUS     HYSTEROSCOPY     None     Patient Active Problem List   Diagnosis Date Noted   Refused influenza vaccine 03/08/2022   Need for varicella vaccine 03/08/2022   Left buttock pain 02/08/2022   Insomnia 02/08/2022   Chest pain 07/12/2021   Bilateral lower extremity edema 07/12/2021   Encounter for screening mammogram for malignant neoplasm of breast 07/12/2021   Screening for colon cancer 07/12/2021   Vitamin D deficiency 07/12/2021   Prediabetes 05/09/2021   Family history of malignant neoplasm of digestive organs 05/09/2021    Colon cancer screening 12/13/2020   Constipation 12/13/2020   Dysphagia 12/13/2020   Family history of malignant neoplasm of gastrointestinal tract 12/13/2020   Obesity 05/18/2017   Anxiety and depression 04/04/2017   Depression 11/15/2013   Rectal bleeding 09/01/2013   Benign paroxysmal positional vertigo 12/25/2012   HEMORRHOIDS, INTERNAL 08/22/2009   GERD 08/22/2009   DYSPHAGIA UNSPECIFIED 08/22/2009   EDEMA 12/03/2008   DYSPNEA 12/03/2008    PCP: Alvira Monday FNP  REFERRING PROVIDER: Yevonne Pax, DPM   REFERRING DIAG: M76.61 (ICD-10-CM) - Achilles tendinitis, right leg M76.821 (ICD-10-CM) - Posterior tibial tendon dysfunction (PTTD) of right lower extremity   THERAPY DIAG:  Pain in right ankle and joints of right foot  Rationale for Evaluation and Treatment Rehabilitation  ONSET DATE: Chronic >20 years   SUBJECTIVE:   SUBJECTIVE STATEMENT: Patient presents to therapy with complaint of RT ankle/ heel pain. This is chronic in nature. She had recent xrays which showed spurring. She has had orthotics which did not help. She has had steroids which did not help. She has not had physical therapy.   PERTINENT HISTORY: NA  PAIN:  Are you having pain? Yes: NPRS scale: 4/10 Pain location: RT medial/ posterior heel  Pain description: dull aching, sharp Aggravating factors: standing, walking  Relieving factors: non WB   PRECAUTIONS: None  WEIGHT BEARING RESTRICTIONS No  FALLS:  Has patient fallen in last 6 months? No  LIVING ENVIRONMENT: Lives with: lives with their family Lives in: Mobile home Stairs: Yes: External: 3 steps; can reach both Has following equipment at home: Single point cane  OCCUPATION: Security   PLOF: Independent  PATIENT GOALS Be able to be on my feet a few hours without pain    OBJECTIVE:   DIAGNOSTIC FINDINGS: NA  PATIENT SURVEYS:  FOTO 49% function   COGNITION:  Overall cognitive status: Within functional limits for  tasks assessed     SENSATION: WFL   PALPATION: Mod TTP to medial RT ankle, achilles insertion   LOWER EXTREMITY ROM:  Active ROM Right eval Left eval  Hip flexion    Hip extension    Hip abduction    Hip adduction    Hip internal rotation    Hip external rotation    Knee flexion    Knee extension    Ankle dorsiflexion 15 18  Ankle plantarflexion 47 50  Ankle inversion    Ankle eversion     (Blank rows = not tested)  LOWER EXTREMITY MMT:  MMT Right eval Left eval  Hip flexion 5 5  Hip extension 5 5  Hip abduction 4+ 4+  Hip adduction    Hip internal rotation    Hip external rotation    Knee flexion    Knee extension 5 5  Ankle dorsiflexion 5 5  Ankle plantarflexion    Ankle inversion 4+ 5  Ankle eversion 4+ 5   (Blank rows = not tested)   GAIT:  Assistive device utilized: None Level of assistance: Complete Independence Comments: slight increase in foot pronation     TODAY'S TREATMENT: Eval  Band PF Band INV Band EV   PATIENT EDUCATION:  Education details: on eval findings, POC and HEP Person educated: Patient Education method: Explanation Education comprehension: verbalized understanding   HOME EXERCISE PROGRAM: Access Code: [email or web address] URL: [email or web address] Date: 04/05/2022 Prepared by: Josue Hector  Exercises - Seated Ankle Plantar Flexion with Resistance Loop  - 3 x daily - 7 x weekly - 3 sets - 10 reps - Seated Figure 4 Ankle Inversion with Resistance  - 3 x daily - 7 x weekly - 3 sets - 10 reps - Seated Ankle Eversion with Resistance  - 3 x daily - 7 x weekly - 3 sets - 10 reps  ASSESSMENT:  CLINICAL IMPRESSION: Patient is a 61 y.o. female who presents to physical therapy with complaint of Rt foot/ ankle pain. Patient demonstrates muscle weakness, reduced ROM, and fascial restrictions which are likely contributing to symptoms of pain and are negatively impacting patient ability to perform ADLs and functional  mobility tasks. Patient will benefit from skilled physical therapy services to address these deficits to reduce pain and improve level of function with ADLs and functional mobility tasks.    OBJECTIVE IMPAIRMENTS Abnormal gait, decreased activity tolerance, decreased mobility, difficulty walking, decreased strength, improper body mechanics, and pain.   ACTIVITY LIMITATIONS standing, squatting, and locomotion level  PARTICIPATION LIMITATIONS: meal prep, cleaning, laundry, driving, shopping, community activity, occupation, and yard work  PERSONAL FACTORS Time since onset of injury/illness/exacerbation are also affecting patient's functional outcome.   REHAB POTENTIAL: Good  CLINICAL DECISION MAKING: Stable/uncomplicated  EVALUATION COMPLEXITY: Low   GOALS: SHORT TERM GOALS: Target date: 05/03/2022  Patient will be independent with initial HEP and self-management strategies to improve functional outcomes  Baseline:  Goal status: INITIAL   LONG TERM GOALS: Target date: 05/17/2022  Patient will be independent with advanced HEP and self-management strategies to improve functional outcomes Baseline:  Goal status: INITIAL  2.  Patient will improve FOTO score to predicted value to indicate improvement in functional outcomes Baseline: 49% function  Goal status: INITIAL  3.  Patient will report reduction of foot pain to <3/10 when walking or improved quality of life and ability to perform ADLs  Baseline: 8/10 Goal status: INITIAL  4. Patient will be able to stand/ walk at least 1 hour with pain not to exceed 3/10 for improved ADLs and daily function  Baseline: 8/10 Goal status: INITIAL   PLAN: PT FREQUENCY: 1-2x/week  PT DURATION: 6 weeks  PLANNED INTERVENTIONS: Therapeutic exercises, Therapeutic activity, Neuromuscular re-education, Balance training, Gait training, Patient/Family education, Joint manipulation, Joint mobilization, Stair training, Aquatic Therapy, Dry Needling,  Electrical stimulation, Spinal manipulation, Spinal mobilization, Cryotherapy, Moist heat, scar mobilization, Taping, Traction, Ultrasound, Biofeedback, Ionotophoresis '4mg'$ /ml Dexamethasone, and Manual therapy.   PLAN FOR NEXT SESSION: Progress foot and ankle strengthening   3:14 PM, 04/05/22 Josue Hector PT DPT  Physical Therapist with [facility]  [phone]

## 2022-04-10 ENCOUNTER — Encounter: Payer: Self-pay | Admitting: *Deleted

## 2022-04-10 ENCOUNTER — Ambulatory Visit: Payer: Self-pay | Admitting: *Deleted

## 2022-04-10 NOTE — Patient Instructions (Signed)
Visit Information  Thank you for taking time to visit with me today. Please don't hesitate to contact me if I can be of assistance to you.   Following are the goals we discussed today:   Goals Addressed               This Visit's Progress     Reduce and Manage Symptoms of Anxiety and Depression. (pt-stated)   On track     Care Coordination Interventions:  Deep Breathing Exercises, Relaxation Techniques & Mindfulness Meditation Strategies Reviewed & Encouraged Daily.   Active Listening/Reflection Utilized.  Emotional Support Provided. Verbalization of Feelings Encouraged.  Problem Solving/Task Centered Solutions Employed.   Review the Following List of [name] and Resources, Emailed on 04/10/2022:  ~ [place]  ~ Marriage, Relationship, Individual and Family Counseling Services  ~ Counseling Agencies in [place] Please Be Prepared to Review and Discuss List of [name] and Resources During Our Next [employer], in An Effort to [employer].           Our next appointment is by telephone on 04/24/2022 at 11:15 am.  Please call the care guide team at [phone] if you need to cancel or reschedule your appointment.   If you are experiencing a Mental Health or [place] or need someone to talk to, please call the Suicide and Crisis Lifeline: 988 call the [place] National Suicide Prevention Lifeline: (562)753-3027 or TTY: 602-824-9570 TTY 407-316-2141) to talk to a trained counselor call 1-800-273-TALK (toll free, 24 hour hotline) go to [facility] [address], [place] [phone]) call the [place]: [phone] call 911  Patient verbalizes understanding of instructions and care plan provided today and agrees to view in [place]. Active MyChart status and patient understanding of how to access instructions and care plan via  MyChart confirmed with patient.     Telephone follow up appointment with care management team member scheduled for:  04/24/2022 at 11:15 am.  [name], BSW, MSW, [place]  Licensed Clinical Social Worker  [place]  Mailing [place]. [address] [place], [place] [address] Physical Address-[address] [place], [place] [address] Toll Free Main # [phone] Fax # [phone] Cell # [phone] [email or web address]

## 2022-04-10 NOTE — Patient Outreach (Signed)
  Care Coordination   Follow Up Visit Note   04/10/2022  Name: Adriana [name] MRN: [id number] DOB: [birth year]  Adriana [name] is a 61 y.o. year old female who sees Alvira Monday, [place] for primary care. I spoke with Adriana [name] by phone today.  What matters to the patients health and wellness today?  Reduce and Manage Symptoms of Anxiety and Depression.    Goals Addressed               This Visit's Progress     Reduce and Manage Symptoms of Anxiety and Depression. (pt-stated)   On track     Care Coordination Interventions:  Deep Breathing Exercises, Relaxation Techniques & Mindfulness Meditation Strategies Reviewed & Encouraged Daily.   Active Listening/Reflection Utilized.  Emotional Support Provided. Verbalization of Feelings Encouraged.  Problem Solving/Task Centered Solutions Employed.   Review the Following List of [name] and Resources, Emailed on 04/10/2022:  ~ [place]  ~ Marriage, Relationship, Individual and Family Counseling Services  ~ Counseling Agencies in [place] Please Be Prepared to Review and Discuss List of [name] and Resources During Our Next [employer], in An Effort to [employer].         SDOH assessments and interventions completed:  Yes.   Care Coordination Interventions Activated:  Yes.    Care Coordination Interventions:  Yes, provided.    Follow up plan: Follow up call scheduled for 04/24/2022 at 11:15 am.   Encounter Outcome:  Pt. Visit Completed.    [name], BSW, MSW, LCSW  Licensed Education officer, environmental [facility] System  Mailing [place]. [address], [place], [place] [address] Physical Address-[address] [place],  [address] Toll Free Main # [phone] Fax # [phone] Cell # [phone] [email or web address]

## 2022-04-12 ENCOUNTER — Encounter (HOSPITAL_COMMUNITY): Payer: 59

## 2022-04-19 ENCOUNTER — Telehealth (HOSPITAL_COMMUNITY): Payer: Self-pay

## 2022-04-19 ENCOUNTER — Encounter (HOSPITAL_COMMUNITY): Payer: 59

## 2022-04-19 ENCOUNTER — Ambulatory Visit: Payer: 59 | Admitting: Podiatry

## 2022-04-19 NOTE — Telephone Encounter (Signed)
No show, attempted to call with no answer and mailbox is full, unable to leave message concerning missed apt.   Ihor [place], LPTA/CLT; Delana Meyer [phone]

## 2022-04-24 ENCOUNTER — Encounter: Payer: Self-pay | Admitting: *Deleted

## 2022-04-24 ENCOUNTER — Ambulatory Visit: Payer: Self-pay | Admitting: *Deleted

## 2022-04-24 NOTE — Patient Instructions (Signed)
Visit Information  Thank you for taking time to visit with me today. Please don't hesitate to contact me if I can be of assistance to you.   Following are the goals we discussed today:   Goals Addressed               This Visit's Progress     Reduce and Manage Symptoms of Anxiety and Depression. (pt-stated)   On track     Care Coordination Interventions:  Deep Breathing Exercises, Relaxation Techniques & Mindfulness Meditation Strategies Encouraged Daily.   Active Listening Utilized.  Emotional Support Provided. Verbalization of Feelings Encouraged.  Task-Centered Solutions Employed.   Cognitive Behavioral Therapy Initiated. Reviewed the Following List of Counseling Agencies and Resources:  ~ [place]  ~ Marriage, Relationship, Individual and Family Counseling Services  ~ [name] in [place], in An Effort to [employer].         Our next appointment is by telephone on 04/07/2022 at 10:30 am.  Please call the care guide team at [phone] if you need to cancel or reschedule your appointment.   If you are experiencing a Mental Health or [place] or need someone to talk to, please call the Suicide and Crisis Lifeline: 988 call the [place] National Suicide Prevention Lifeline: 8307342836 or TTY: 213-360-4566 TTY 959-746-4333) to talk to a trained counselor call 1-800-273-TALK (toll free, 24 hour hotline) go to [facility] [address] [place] [phone]) call the [place]: [phone] call 911  Patient verbalizes understanding of instructions and care plan provided today and agrees to view in [place]. Active MyChart status and patient understanding of how to access instructions and care plan via MyChart confirmed with patient.     Telephone follow up appointment with care management team member  scheduled for:  04/07/2022 at 10:30 am.  [name], BSW, MSW, [place]  Licensed Clinical Social Worker  [place]  Mailing [place]. [address], [place], [place] [address] Physical Address[address] [place], [place] [address] Toll Free Main # [phone] Fax # [phone] Cell # [phone] [email or web address]

## 2022-04-24 NOTE — Patient Outreach (Signed)
  Care Coordination   Follow Up Visit Note   04/24/2022  Name: Adriana [name] MRN: [id number] DOB: [birth year]  Adriana [name] is a 61 y.o. year old female who sees Alvira Monday, [place] for primary care. I spoke with Adriana [name] by phone today.  What matters to the patients health and wellness today?  Reduce and Manage Symptoms of Anxiety and Depression.    Goals Addressed               This Visit's Progress     Reduce and Manage Symptoms of Anxiety and Depression. (pt-stated)   On track     Care Coordination Interventions:  Deep Breathing Exercises, Relaxation Techniques & Mindfulness Meditation Strategies Encouraged Daily.   Active Listening Utilized.  Emotional Support Provided. Verbalization of Feelings Encouraged.  Task-Centered Solutions Employed.   Cognitive Behavioral Therapy Initiated. Reviewed the Following List of Counseling Agencies and Resources:  ~ [place]  ~ Marriage, Relationship, Individual and Family Counseling Services  ~ [name] in [place], in An Effort to [employer].         SDOH assessments and interventions completed:  Yes.   Care Coordination Interventions Activated:  Yes.    Care Coordination Interventions:  Yes, provided.    Follow up plan: Follow up call scheduled for 05/08/2022 at 10:30 am.   Encounter Outcome:  Pt. Visit Completed.    [name], BSW, MSW, LCSW  Licensed Education officer, environmental Health System  Mailing [place]. [address] [place], [place] [address] Physical Address-[address], [place], [place] [address] Toll Free Main # [phone] Fax # [phone] Cell # [phone] [email or web address]

## 2022-04-25 ENCOUNTER — Ambulatory Visit: Payer: 59 | Admitting: Family Medicine

## 2022-04-26 ENCOUNTER — Encounter (HOSPITAL_COMMUNITY): Payer: 59 | Admitting: Physical Therapy

## 2022-04-26 ENCOUNTER — Encounter (HOSPITAL_COMMUNITY): Payer: Self-pay | Admitting: Physical Therapy

## 2022-04-26 NOTE — Therapy (Unsigned)
2nd No show   Contacted Pt. Reminded her of her next appointment and to please call 24 hr in advance if she needs to cancel.  Adriana [name], [place] CLT [phone]

## 2022-04-27 ENCOUNTER — Encounter: Payer: Self-pay | Admitting: Family Medicine

## 2022-05-03 ENCOUNTER — Encounter (HOSPITAL_COMMUNITY): Payer: 59 | Admitting: Physical Therapy

## 2022-05-03 NOTE — Progress Notes (Signed)
61 y.o. [id number] Divorced White or Caucasian Not Hispanic or Latino female here for annual exam.  No vaginal bleeding. Sexually active, no pain. Same long term partner, not living together.   She has some abdominal bloating. She has been hungry and eating more. She is having a BM every 1-2 days, no change.   She is complaining of flank pain on the left for 2 months. She has lower back pain as well. No urinary frequency, urgency or dysuria.  She has nocturia 2-3 x a night, voids large amounts. Mild GSI, stable, tolerable.   She c/o a couple week h/o an increase in yellow/clear vaginal d/c and itching, no odor.    H/O HSV, she gets about one outbreak every 5 years.   Patient's last menstrual period was 01/16/2014.          Sexually active: Yes.    The current method of family planning is post menopausal status.    Exercising: No.  The patient does not participate in regular exercise at present. Smoker:  no  Health Maintenance: Pap:  03/26/17 normal HPV Neg , 01/26/15 neg. HR HPV:neg   History of abnormal Pap:  Years ago, had a colposcopy, no h/o surgery  MMG:  09/12/18 density C Bi-rads 1 neg  BMD:   none  Colonoscopy: 04/25/18 f/u 3 years  TDaP:  03/26/2017 Gardasil: na   reports that she has never smoked. She has never been exposed to tobacco smoke. She has never used smokeless tobacco. She reports current alcohol use. She reports that she does not use drugs. Just occasional ETOH. Works for the [employer] at [employer] in [employer]. Daughter is grown, moved home for a little while.    Past Medical History:  Diagnosis Date   Anemia    history   Anxiety    Benign positional vertigo 12/19/2013   Depression    Dyspnea    with exertion   Edema    Endometrial polyp    Fibroids    GERD (gastroesophageal reflux disease)    Insomnia    Internal and external hemorrhoids without complication    Migraine    Morbid obesity ([place])    Plantar fasciitis    PMB (postmenopausal  bleeding)    Prediabetes    Rectal bleeding    Reflux     Past Surgical History:  Procedure Laterality Date   DILATATION & CURETTAGE/HYSTEROSCOPY WITH MYOSURE N/A 07/23/2017   Procedure: DILATATION & CURETTAGE/HYSTEROSCOPY WITH MYOSURE;  Surgeon: Salvadore Dom, MD;  Location: [facility];  Service: Gynecology;  Laterality: N/A;  endometrial polyp   DILATION AND CURETTAGE OF UTERUS     HYSTEROSCOPY     None      Current Outpatient Medications  Medication Sig Dispense Refill   Cholecalciferol (VITAMIN D3) 25 MCG (1000 UT) CAPS Take 1 capsule (1,000 Units total) by mouth daily. 60 capsule 3   Magnesium 400 MG TABS Take 1 tablet by mouth as needed.     MELATONIN PO Take by mouth. As needed for sleep     No current facility-administered medications for this visit.    Family History  Problem Relation Age of Onset   Colon cancer Father        diagnosed in late 55s.    Hypertension Father    Heart attack Father    Esophageal cancer Neg Hx    Breast cancer Neg Hx    Cervical cancer Neg Hx     Review  of Systems  Genitourinary:  Positive for flank pain.    Exam:   BP 134/82   Pulse 66   Ht 5' 6.5" (1.689 m) Comment: patient reported due to hair.  Wt 261 lb (118.4 kg)   LMP 01/16/2014   SpO2 100%   BMI 41.50 kg/m   Weight change: '@WEIGHTCHANGE'$ @ Height:   Height: 5' 6.5" (168.9 cm) (patient reported due to hair.)  Ht Readings from Last 3 Encounters:  05/14/22 5' 6.5" (1.689 m)  03/08/22 '5\' 6"'$  (1.676 m)  02/08/22 5' 6.5" (1.689 m)    General appearance: alert, cooperative and appears stated age Head: Normocephalic, without obvious abnormality, atraumatic Neck: no adenopathy, supple, symmetrical, trachea midline and thyroid normal to inspection and palpation Lungs: clear to auscultation bilaterally Cardiovascular: regular rate and rhythm Breasts: normal appearance, no masses or tenderness Abdomen: soft, non-tender; non distended,  no masses,  no  organomegaly Extremities: extremities normal, atraumatic, no cyanosis or edema Skin: Skin color, texture, turgor normal. No rashes or lesions Lymph nodes: Cervical, supraclavicular, and axillary nodes normal. No abnormal inguinal nodes palpated Neurologic: Grossly normal   Pelvic: External genitalia:  no lesions              Urethra:  normal appearing urethra with no masses, tenderness or lesions              Bartholins and Skenes: normal                 Vagina: normal appearing vagina with normal color and discharge, no lesions              Cervix: no lesions               Bimanual Exam:  Uterus:   no masses or tenderness              Adnexa: no mass, fullness, tenderness               Rectovaginal: Confirms               Anus:  normal sphincter tone, no lesions  Wandra Scot, RMA chaperoned for the exam.   1. Well woman exam Discussed breast self exam Discussed calcium and vit D intake Mammogram overdue, she will schedule Screening labs with primary  2. Screening for cervical cancer - Cytology - PAP  3. Colon cancer screening Colonoscopy is overdue - Ambulatory referral to Gastroenterology  4. Acute vaginitis - betamethasone valerate ointment (VALISONE) 0.1 %; Apply 1 Application topically 2 (two) times daily. Use for up to 1-2 weeks as needed.  Dispense: 30 g; Refill: 0  5. Bacterial vaginitis - metroNIDAZOLE (METROGEL) 0.75 % vaginal gel; Place 1 Applicatorful vaginally at bedtime. Use nightly x 5 nights  Dispense: 70 g; Refill: 0  6. Screening examination for STD (sexually transmitted disease) - RPR - HIV Antibody (routine testing w rflx) - Hepatitis C antibody - SURESWAB CT/NG/T. vaginalis  7. History of herpes genitalis - valACYclovir (VALTREX) 500 MG tablet; Take one tablet po BID x 3 days prn  Dispense: 30 tablet; Refill: 1  8. Prediabetes - Hemoglobin A1c  9. Vitamin D deficiency Not consistently taking vit d - VITAMIN D 25 Hydroxy (Vit-D Deficiency,  Fractures)  10. Flank pain Suspect MS pain, also with lower back pain - Urinalysis, Complete: negative -F/U with primary

## 2022-05-08 ENCOUNTER — Ambulatory Visit: Payer: Self-pay | Admitting: *Deleted

## 2022-05-08 ENCOUNTER — Encounter: Payer: Self-pay | Admitting: *Deleted

## 2022-05-08 NOTE — Patient Instructions (Signed)
Visit Information  Thank you for taking time to visit with me today. Please don't hesitate to contact me if I can be of assistance to you.   Following are the goals we discussed today:   Goals Addressed               This Visit's Progress     Reduce and Manage Symptoms of Anxiety and Depression. (pt-stated)   On track     Care Coordination Interventions:  Active Listening/Reflection Utilized.  Emotional Support Provided. Verbalization of Feelings Encouraged.  Solution-Focused Strategies Employed.   Cognitive Behavioral Therapy Initiated. Client-Centered Therapy Performed. Continue Contacting Counseling Agencies and Resources of Interest, in An Effort to Establish Ongoing Counseling and Supportive Services.   Review Educational Material on Depression, Emailed on 05/08/2022:  ~ 12 Symptoms of Depression That Shouldn't Be Ignored  ~ Loneliness and Isolation - Health and safety inspector Be Prepared to Review Educational Material on Depression, During Next Scheduled Telephone [employer], to [employer].        Our next appointment is by telephone on 05/22/2022 at 10:30 am.  Please call the care guide team at [phone] if you need to cancel or reschedule your appointment.   If you are experiencing a Mental Health or [place] or need someone to talk to, please call the Suicide and Crisis Lifeline: 988 call the [place] National Suicide Prevention Lifeline: 908-197-1365 or TTY: 4403426430 TTY (343) 500-3961) to talk to a trained counselor call 1-800-273-TALK (toll free, 24 hour hotline) go to [facility] [address] [place] [phone]) call the [place]: [phone] call 911  Patient verbalizes understanding of instructions and care plan provided today and agrees to view in [place]. Active MyChart status and patient understanding of how to access instructions and care plan via MyChart  confirmed with patient.     Telephone follow up appointment with care management team member scheduled for:  05/22/2022 at 10:30 am.  [name], BSW, MSW, [place]  Licensed Clinical Social Worker  [place]  Mailing [place]. [address] [place], [place] [address] Physical Address[address], [place], [place] [address] Toll Free Main # [phone] Fax # [phone] Cell # [phone] [email or web address]

## 2022-05-08 NOTE — Patient Outreach (Signed)
  Care Coordination   Follow Up Visit Note   05/08/2022  Name: Adriana [name] MRN: [id number] DOB: 13-Aug-1960  Adriana [name] is a 61 y.o. year old female who sees Alvira Monday, [place] for primary care. I spoke with Adriana [name] by phone today.  What matters to the patients health and wellness today?   Reduce and Manage Symptoms of Anxiety and Depression.   Goals Addressed               This Visit's Progress     Reduce and Manage Symptoms of Anxiety and Depression. (pt-stated)   On track     Care Coordination Interventions:  Active Listening/Reflection Utilized.  Emotional Support Provided. Verbalization of Feelings Encouraged.  Solution-Focused Strategies Employed.   Cognitive Behavioral Therapy Initiated. Client-Centered Therapy Performed. Continue Contacting Counseling Agencies and Resources of Interest, in An Effort to Establish Ongoing Counseling and Supportive Services.   Review Educational Material on Depression, Emailed on 05/08/2022:  ~ 12 Symptoms of Depression That Shouldn't Be Ignored  ~ Loneliness and Isolation - Health and safety inspector Be Prepared to Review Educational Material on Depression, During Next Scheduled Telephone [employer], to [employer].        SDOH assessments and interventions completed:  Yes.   Care Coordination Interventions Activated:  Yes.    Care Coordination Interventions:  Yes, provided.    Follow up plan: Follow up call scheduled for 05/22/2022 at 10:30 am.   Encounter Outcome:  Pt. Visit Completed.    [name], BSW, MSW, LCSW  Licensed Education officer, environmental [facility]  Mailing [place] [address] [place], [place] [address] Physical Address[address] [place], [place] [address] Toll Free Main # [phone] Fax # [phone] Cell # [phone] [email or web address]

## 2022-05-14 ENCOUNTER — Encounter: Payer: Self-pay | Admitting: Obstetrics and Gynecology

## 2022-05-14 ENCOUNTER — Ambulatory Visit (INDEPENDENT_AMBULATORY_CARE_PROVIDER_SITE_OTHER): Payer: 59 | Admitting: Obstetrics and Gynecology

## 2022-05-14 ENCOUNTER — Other Ambulatory Visit (HOSPITAL_COMMUNITY)
Admission: RE | Admit: 2022-05-14 | Discharge: 2022-05-14 | Disposition: A | Discharge status: Home / Self Care | Payer: 59 | Source: Ambulatory Visit | Attending: Obstetrics and Gynecology | Admitting: Obstetrics and Gynecology

## 2022-05-14 VITALS — BP 134/82 | HR 66 | Ht 66.5 in | Wt 261.0 lb

## 2022-05-14 DIAGNOSIS — E559 Vitamin D deficiency, unspecified: Secondary | ICD-10-CM | POA: Diagnosis not present

## 2022-05-14 DIAGNOSIS — R7303 Prediabetes: Secondary | ICD-10-CM | POA: Diagnosis not present

## 2022-05-14 DIAGNOSIS — Z113 Encounter for screening for infections with a predominantly sexual mode of transmission: Secondary | ICD-10-CM

## 2022-05-14 DIAGNOSIS — B9689 Other specified bacterial agents as the cause of diseases classified elsewhere: Secondary | ICD-10-CM

## 2022-05-14 DIAGNOSIS — Z8619 Personal history of other infectious and parasitic diseases: Secondary | ICD-10-CM | POA: Diagnosis not present

## 2022-05-14 DIAGNOSIS — Z1211 Encounter for screening for malignant neoplasm of colon: Secondary | ICD-10-CM

## 2022-05-14 DIAGNOSIS — N760 Acute vaginitis: Secondary | ICD-10-CM | POA: Diagnosis not present

## 2022-05-14 DIAGNOSIS — Z124 Encounter for screening for malignant neoplasm of cervix: Secondary | ICD-10-CM | POA: Insufficient documentation

## 2022-05-14 DIAGNOSIS — Z01419 Encounter for gynecological examination (general) (routine) without abnormal findings: Secondary | ICD-10-CM

## 2022-05-14 DIAGNOSIS — R109 Unspecified abdominal pain: Secondary | ICD-10-CM | POA: Diagnosis not present

## 2022-05-14 LAB — URINALYSIS, COMPLETE
Bacteria, UA: NONE SEEN /HPF
Bilirubin Urine: NEGATIVE
Glucose, UA: NEGATIVE
Hyaline Cast: NONE SEEN /LPF
Ketones, ur: NEGATIVE
Leukocytes,Ua: NEGATIVE
Nitrite: NEGATIVE
Protein, ur: NEGATIVE
RBC / HPF: NONE SEEN /HPF (ref 0–2)
Specific Gravity, Urine: 1.01 (ref 1.001–1.035)
WBC, UA: NONE SEEN /HPF (ref 0–5)
pH: 5.5 (ref 5.0–8.0)

## 2022-05-14 LAB — WET PREP FOR TRICH, YEAST, CLUE

## 2022-05-14 MED ORDER — VALACYCLOVIR HCL 500 MG PO TABS: ORAL_TABLET | ORAL | 1 refills | Status: DC

## 2022-05-14 MED ORDER — METRONIDAZOLE 0.75 % VA GEL: 1.0000 | Freq: Every day | VAGINAL | 0 refills | Status: DC

## 2022-05-14 MED ORDER — BETAMETHASONE VALERATE 0.1 % EX OINT: 1.0000 | TOPICAL_OINTMENT | Freq: Two times a day (BID) | CUTANEOUS | 0 refills | Status: DC

## 2022-05-14 NOTE — Patient Instructions (Signed)
EXERCISE   We recommended that you start or continue a regular exercise program for good health. Physical activity is anything that gets your body moving, some is better than none. The CDC recommends 150 minutes per week of Moderate-Intensity Aerobic Activity and 2 or more days of Muscle Strengthening Activity.  Benefits of exercise are limitless: helps weight loss/weight maintenance, improves mood and energy, helps with depression and anxiety, improves sleep, tones and strengthens muscles, improves balance, improves bone density, protects from chronic conditions such as heart disease, high blood pressure and diabetes and so much more. To learn more visit: https://www.cdc.gov/physicalactivity/index.html  DIET: Good nutrition starts with a healthy diet of fruits, vegetables, whole grains, and lean protein sources. Drink plenty of water for hydration. Minimize empty calories, sodium, sweets. For more information about dietary recommendations visit: https://health.gov/our-work/nutrition-physical-activity/dietary-guidelines and https://www.myplate.gov/  ALCOHOL:  Women should limit their alcohol intake to no more than 7 drinks/beers/glasses of wine (combined, not each!) per week. Moderation of alcohol intake to this level decreases your risk of breast cancer and liver damage.  If you are concerned that you may have a problem, or your friends have told you they are concerned about your drinking, there are many resources to help. A well-known program that is free, effective, and available to all people all over the nation is Alcoholics Anonymous.  Check out this site to learn more: https://www.aa.org/   CALCIUM AND VITAMIN D:  Adequate intake of calcium and Vitamin D are recommended for bone health.  You should be getting between 1000-1200 mg of calcium and 800 units of Vitamin D daily between diet and supplements  PAP SMEARS:  Pap smears, to check for cervical cancer or precancers,  have traditionally been  done yearly, scientific advances have shown that most women can have pap smears less often.  However, every woman still should have a physical exam from her gynecologist every year. It will include a breast check, inspection of the vulva and vagina to check for abnormal growths or skin changes, a visual exam of the cervix, and then an exam to evaluate the size and shape of the uterus and ovaries. We will also provide age appropriate advice regarding health maintenance, like when you should have certain vaccines, screening for sexually transmitted diseases, bone density testing, colonoscopy, mammograms, etc.   MAMMOGRAMS:  All women over 40 years old should have a routine mammogram.   COLON CANCER SCREENING: Now recommend starting at age 45. At this time colonoscopy is not covered for routine screening until 50. There are take home tests that can be done between 45-49.   COLONOSCOPY:  Colonoscopy to screen for colon cancer is recommended for all women at age 50.  We know, you hate the idea of the prep.  We agree, BUT, having colon cancer and not knowing it is worse!!  Colon cancer so often starts as a polyp that can be seen and removed at colonscopy, which can quite literally save your life!  And if your first colonoscopy is normal and you have no family history of colon cancer, most women don't have to have it again for 10 years.  Once every ten years, you can do something that may end up saving your life, right?  We will be happy to help you get it scheduled when you are ready.  Be sure to check your insurance coverage so you understand how much it will cost.  It may be covered as a preventative service at no cost, but you should check   your particular policy.      Breast Self-Awareness Breast self-awareness means being familiar with how your breasts look and feel. It involves checking your breasts regularly and reporting any changes to your health care provider. Practicing breast self-awareness is  important. A change in your breasts can be a sign of a serious medical problem. Being familiar with how your breasts look and feel allows you to find any problems early, when treatment is more likely to be successful. All women should practice breast self-awareness, including women who have had breast implants. How to do a breast self-exam One way to learn what is normal for your breasts and whether your breasts are changing is to do a breast self-exam. To do a breast self-exam: Look for Changes  Remove all the clothing above your waist. Stand in front of a mirror in a room with good lighting. Put your hands on your hips. Push your hands firmly downward. Compare your breasts in the mirror. Look for differences between them (asymmetry), such as: Differences in shape. Differences in size. Puckers, dips, and bumps in one breast and not the other. Look at each breast for changes in your skin, such as: Redness. Scaly areas. Look for changes in your nipples, such as: Discharge. Bleeding. Dimpling. Redness. A change in position. Feel for Changes Carefully feel your breasts for lumps and changes. It is best to do this while lying on your back on the floor and again while sitting or standing in the shower or tub with soapy water on your skin. Feel each breast in the following way: Place the arm on the side of the breast you are examining above your head. Feel your breast with the other hand. Start in the nipple area and make  inch (2 cm) overlapping circles to feel your breast. Use the pads of your three middle fingers to do this. Apply light pressure, then medium pressure, then firm pressure. The light pressure will allow you to feel the tissue closest to the skin. The medium pressure will allow you to feel the tissue that is a little deeper. The firm pressure will allow you to feel the tissue close to the ribs. Continue the overlapping circles, moving downward over the breast until you feel your  ribs below your breast. Move one finger-width toward the center of the body. Continue to use the  inch (2 cm) overlapping circles to feel your breast as you move slowly up toward your collarbone. Continue the up and down exam using all three pressures until you reach your armpit.  Write Down What You Find  Write down what is normal for each breast and any changes that you find. Keep a written record with breast changes or normal findings for each breast. By writing this information down, you do not need to depend only on memory for size, tenderness, or location. Write down where you are in your menstrual cycle, if you are still menstruating. If you are having trouble noticing differences in your breasts, do not get discouraged. With time you will become more familiar with the variations in your breasts and more comfortable with the exam. How often should I examine my breasts? Examine your breasts every month. If you are breastfeeding, the best time to examine your breasts is after a feeding or after using a breast pump. If you menstruate, the best time to examine your breasts is 5-7 days after your period is over. During your period, your breasts are lumpier, and it may be more   difficult to notice changes. When should I see my health care provider? See your health care provider if you notice: A change in shape or size of your breasts or nipples. A change in the skin of your breast or nipples, such as a reddened or scaly area. Unusual discharge from your nipples. A lump or thick area that was not there before. Pain in your breasts. Anything that concerns you. Chronic Back Pain When back pain lasts longer than 3 months, it is called chronic back pain. The cause of your back pain may not be known. Some common causes include: Wear and tear (degenerative disease) of the bones, ligaments, or disks in your back. Inflammation and stiffness in your back (arthritis). People who have chronic back pain  often go through certain periods in which the pain is more intense (flare-ups). Many people can learn to manage the pain with home care. Follow these instructions at home: Pay attention to any changes in your symptoms. Take these actions to help with your pain: Managing pain and stiffness     If directed, apply ice to the painful area. Your health care provider may recommend applying ice during the first 24-48 hours after a flare-up begins. To do this: Put ice in a plastic bag. Place a towel between your skin and the bag. Leave the ice on for 20 minutes, 2-3 times per day. If directed, apply heat to the affected area as often as told by your health care provider. Use the heat source that your health care provider recommends, such as a moist heat pack or a heating pad. Place a towel between your skin and the heat source. Leave the heat on for 20-30 minutes. Remove the heat if your skin turns bright red. This is especially important if you are unable to feel pain, heat, or cold. You may have a greater risk of getting burned. Try soaking in a warm tub. Activity  Avoid bending and other activities that make the problem worse. Maintain a proper position when standing or sitting: When standing, keep your upper back and neck straight, with your shoulders pulled back. Avoid slouching. When sitting, keep your back straight and relax your shoulders. Do not round your shoulders or pull them backward. Do not sit or stand in one place for long periods of time. Take brief periods of rest throughout the day. This will reduce your pain. Resting in a lying or standing position is usually better than sitting to rest. When you are resting for longer periods, mix in some mild activity or stretching between periods of rest. This will help to prevent stiffness and pain. Get regular exercise. Ask your health care provider what activities are safe for you. Do not lift anything that is heavier than 10 lb (4.5 kg),  or the limit that you are told, until your health care provider says that it is safe. Always use proper lifting technique, which includes: Bending your knees. Keeping the load close to your body. Avoiding twisting. Sleep on a firm mattress in a comfortable position. Try lying on your side with your knees slightly bent. If you lie on your back, put a pillow under your knees. Medicines Treatment may include medicines for pain and inflammation taken by mouth or applied to the skin, prescription pain medicine, or muscle relaxants. Take over-the-counter and prescription medicines only as told by your health care provider. Ask your health care provider if the medicine prescribed to you: Requires you to avoid driving or using machinery. Can  cause constipation. You may need to take these actions to prevent or treat constipation: Drink enough fluid to keep your urine pale yellow. Take over-the-counter or prescription medicines. Eat foods that are high in fiber, such as beans, whole grains, and fresh fruits and vegetables. Limit foods that are high in fat and processed sugars, such as fried or sweet foods. General instructions Do not use any products that contain nicotine or tobacco, such as cigarettes, e-cigarettes, and chewing tobacco. If you need help quitting, ask your health care provider. Keep all follow-up visits as told by your health care provider. This is important. Contact a health care provider if: You have pain that is not relieved with rest or medicine. Your pain gets worse, or you have new pain. You have a high fever. You have rapid weight loss. You have trouble doing your normal activities. Get help right away if: You have weakness or numbness in one or both of your legs or feet. You have trouble controlling your bladder or your bowels. You have severe back pain and have any of the following: Nausea or vomiting. Pain in your abdomen. Shortness of breath or you  faint. Summary Chronic back pain is back pain that lasts longer than 3 months. When a flare-up begins, apply ice to the painful area for the first 24-48 hours. Apply a moist heat pad or use a heating pad on the painful area as directed by your health care provider. When you are resting for longer periods, mix in some mild activity or stretching between periods of rest. This will help to prevent stiffness and pain. This information is not intended to replace advice given to you by your health care provider. Make sure you discuss any questions you have with your health care provider. Document Revised: 07/15/2019 Document Reviewed: 07/15/2019 Elsevier Patient Education  [place].

## 2022-05-14 NOTE — Addendum Note (Signed)
Addended by: Lorine Bears on: 05/14/2022 03:21 PM   Modules accepted: Orders

## 2022-05-15 LAB — CYTOLOGY - PAP
Comment: NEGATIVE
Diagnosis: NEGATIVE
High risk HPV: NEGATIVE

## 2022-05-15 LAB — HEMOGLOBIN A1C
Hgb A1c MFr Bld: 5.9 % of total Hgb — ABNORMAL HIGH (ref <5.7)
Mean Plasma Glucose: 123 mg/dL
eAG (mmol/L): 6.8 mmol/L

## 2022-05-15 LAB — SURESWAB CT/NG/T. VAGINALIS
C. trachomatis RNA, TMA: NOT DETECTED
N. gonorrhoeae RNA, TMA: NOT DETECTED
Trichomonas vaginalis RNA: NOT DETECTED

## 2022-05-15 LAB — HIV ANTIBODY (ROUTINE TESTING W REFLEX): HIV 1&2 Ab, 4th Generation: NONREACTIVE

## 2022-05-15 LAB — RPR: RPR Ser Ql: NONREACTIVE

## 2022-05-15 LAB — HEPATITIS C ANTIBODY: Hepatitis C Ab: NONREACTIVE

## 2022-05-15 LAB — VITAMIN D 25 HYDROXY (VIT D DEFICIENCY, FRACTURES): Vit D, 25-Hydroxy: 21 ng/mL — ABNORMAL LOW (ref 30–100)

## 2022-05-21 ENCOUNTER — Ambulatory Visit: Payer: Self-pay | Admitting: *Deleted

## 2022-05-21 NOTE — Patient Outreach (Signed)
  Care Coordination   05/21/2022  Name: Adriana [name] MRN: [id number] DOB: [birth year]   Care Coordination Outreach Attempts:  An unsuccessful telephone outreach was attempted today to offer the patient information about available care coordination services as a benefit of their health plan. HIPAA compliant message left on voicemail, providing contact information for CSW, encouraging patient to return CSW's call at her earliest convenience.  Follow Up Plan:  Additional outreach attempts will be made to offer the patient care coordination information and services.   Encounter Outcome:  No Answer.   Care Coordination Interventions:  No, not indicated.    [name], BSW, MSW, LCSW  Licensed Education officer, environmental [facility]  Mailing [place] [address], [place], [place] [address] Physical Address[address] [place], [place] [address] Toll Free Main # [phone] Fax # [phone] Cell # [phone] [email or web address]

## 2022-05-22 ENCOUNTER — Encounter: Payer: 59 | Admitting: *Deleted

## 2022-05-23 ENCOUNTER — Telehealth: Payer: Self-pay | Admitting: *Deleted

## 2022-05-23 NOTE — Progress Notes (Signed)
  Care Coordination Note  05/23/2022 Name: FATUMA DOWERS MRN: [id number] DOB: 1960-09-17  Lisset G Ratti is a 61 y.o. year old female who is a primary care patient of Alvira Monday, [place] and is actively engaged with the care management team. I reached out to [place] by phone today to assist with re-scheduling a follow up visit with the Licensed Clinical Social Worker  Follow up plan: Unsuccessful telephone outreach attempt made. A HIPAA compliant phone message was left for the patient providing contact information and requesting a return call.   [place]  Direct Dial: [phone]

## 2022-05-30 NOTE — Progress Notes (Signed)
  Care Coordination Note  05/30/2022 Name: Adriana [name] MRN: [id number] DOB: [birth year]  Adriana [name] is a 61 y.o. year old female who is a primary care patient of Alvira Monday, [place] and is actively engaged with the care management team. I reached out to [place] by phone today to assist with re-scheduling a follow up visit with the Licensed Clinical Social Worker  Follow up plan: Telephone appointment with care management team member scheduled for:06/06/22  [place]  Direct Dial: [phone]

## 2022-06-06 ENCOUNTER — Encounter: Payer: Self-pay | Admitting: *Deleted

## 2022-06-06 ENCOUNTER — Ambulatory Visit: Payer: Self-pay | Admitting: *Deleted

## 2022-06-06 NOTE — Patient Instructions (Signed)
Visit Information  Thank you for taking time to visit with me today. Please don't hesitate to contact me if I can be of assistance to you.   Following are the goals we discussed today:   Goals Addressed               This Visit's Progress     Reduce and Manage Symptoms of Anxiety and Depression. (pt-stated)   On track     Care Coordination Interventions:  Active Listening/Reflection Utilized.  Verbalization of Feelings Encouraged.  Emotional Support Provided. Problem-Solving Interventions Activated. Solution-Focused Strategies Employed.   Task-Centered Solutions Developed. Cognitive Behavioral Therapy Initiated. Continue to Receive Ongoing Counseling and Supportive Services through CSW, with [facility] [phone]). Thoroughly Reviewed Educational Material on Depression, to Ensure Understanding.  Please review "How to Manage Depression During the Holidays", emailed to you on 06/06/2022: ~ 1. Stay Active and Get Outdoors ~ 2. Share How You're Feeling With Trusted Loved Ones ~ 3. Assess Your Relationships and Set Boundaries ~ 4. Consider Volunteering Throughout the Holiday Season ~ 5. Create a Coping Sheet or Depression Toolkit Consider implementing other self-care activities you could add to your toolkit, which may include some of the following interventions: ~ Journaling ~ Acupuncture ~ Listening to Music ~ Spirituality ~ Positive Self-Talk       Our next appointment is by telephone on 06/20/2022 at 1:30 pm.  Please call the care guide team at [phone] if you need to cancel or reschedule your appointment.   If you are experiencing a Mental Health or [place] or need someone to talk to, please call the Suicide and Crisis Lifeline: 988 call the [place] National Suicide Prevention Lifeline: (873)306-9159 or TTY: 502-234-8779 TTY 670-559-1847) to talk to a trained counselor call 1-800-273-TALK (toll free, 24 hour hotline) go to [facility] [address], [place] [phone]) call the [place]: [phone] call 911  Patient verbalizes understanding of instructions and care plan provided today and agrees to view in [place]. Active MyChart status and patient understanding of how to access instructions and care plan via MyChart confirmed with patient.     Telephone follow up appointment with care management team member scheduled for:  06/20/2022 at 1:30 pm.  [name], BSW, MSW, [place]  Licensed Clinical Social Worker  [place]  Mailing [place]. [address] [place], [place] [address] Physical Address[address], [place], [place] [address] Toll Free Main # [phone] Fax # [phone] Cell # [phone] Di [email or web address]

## 2022-06-06 NOTE — Patient Outreach (Signed)
  Care Coordination   Follow Up Visit Note   06/06/2022  Name: Adriana [name] MRN: [id number] DOB: 1961-02-10  Adriana [name] is a 61 y.o. year old female who sees Alvira Monday, [place] for primary care. I spoke with [name] by phone today.  What matters to the patients health and wellness today?  Reduce and Manage Symptoms of Anxiety and Depression.   Goals Addressed               This Visit's Progress     Reduce and Manage Symptoms of Anxiety and Depression. (pt-stated)   On track     Care Coordination Interventions:  Active Listening/Reflection Utilized.  Verbalization of Feelings Encouraged.  Emotional Support Provided. Problem-Solving Interventions Activated. Solution-Focused Strategies Employed.   Task-Centered Solutions Developed. Cognitive Behavioral Therapy Initiated. Continue to Receive Ongoing Counseling and Supportive Services through CSW, with [facility] [phone]). Thoroughly Reviewed Educational Material on Depression, to Ensure Understanding.  Please review "How to Manage Depression During the Holidays", emailed to you on 06/06/2022: ~ 1. Stay Active and Get Outdoors ~ 2. Share How You're Feeling With Trusted Loved Ones ~ 3. Assess Your Relationships and Set Boundaries ~ 4. Consider Volunteering Throughout the Holiday Season ~ 5. Create a Coping Sheet or Depression Toolkit Consider implementing other self-care activities you could add to your toolkit, which may include some of the following interventions: ~ Journaling ~ Acupuncture ~ Listening to Music ~ Spirituality ~ Positive Self-Talk       SDOH assessments and interventions completed:  Yes.  Care Coordination Interventions:  Yes, provided.   Follow up plan: Follow up call scheduled for 06/20/2022 at 1:30 pm.  Encounter Outcome:  Pt. Visit Completed.   Adriana [name], BSW, MSW, LCSW  Licensed Brewing technologist [facility]  Mailing [place] [address], [place], [place] [address] Physical Address[address], [place], [place] [address] Toll Free Main # [phone] Fax # [phone] Cell # [phone] Di [email or web address]

## 2022-06-20 ENCOUNTER — Ambulatory Visit: Payer: Self-pay | Admitting: *Deleted

## 2022-06-20 NOTE — Patient Outreach (Signed)
  Care Coordination   06/20/2022  Name: Adriana [name] MRN: [id number] DOB: 1960-07-24   Care Coordination Outreach Attempts:  An unsuccessful telephone outreach was attempted today to offer the patient information about available care coordination services as a benefit of their health plan. HIPAA compliant message left on voicemail, providing contact information for CSW, encouraging patient to return CSW's call at her earliest convenience.   Follow Up Plan:  Additional outreach attempts will be made to offer the patient care coordination information and services.    Encounter Outcome:  No Answer.    Care Coordination Interventions:  No, not indicated.     [name], BSW, MSW, LCSW  Licensed Education officer, environmental Health System  Mailing [place] [address], [place], [place] [address] Physical Address-[address], [place], [place] [address] Toll Free Main # [phone] Fax # [phone] Cell # [phone] Di [email or web address]

## 2022-06-27 ENCOUNTER — Encounter: Payer: Self-pay | Admitting: Nurse Practitioner

## 2022-06-29 ENCOUNTER — Encounter: Payer: Self-pay | Admitting: *Deleted

## 2022-06-29 ENCOUNTER — Ambulatory Visit: Payer: Self-pay | Admitting: *Deleted

## 2022-06-30 NOTE — Patient Instructions (Signed)
Visit Information  Thank you for taking time to visit with me today. Please don't hesitate to contact me if I can be of assistance to you.   Following are the goals we discussed today:   Goals Addressed               This Visit's Progress     Reduce and Manage Symptoms of Anxiety and Depression. (pt-stated)   On track     Care Coordination Interventions:  Active Listening & Reflection Utilized.  Verbalization of Feelings Encouraged.  Emotional Support Provided. Problem-Solving Interventions Activated. Solution-Focused Strategies Employed.   Cognitive Behavioral Therapy Initiated. Client-Centered Solutions Developed. Continue to Receive Ongoing Counseling & Supportive Services through CSW, with [facility] [phone]). Please Begin Implementing the Following Self-Care Activities: ~ Journaling ~ Acupuncture ~ Listening to Music ~ Spirituality ~ Positive Self-Talk Begin Participating in Activities of Interest, From List Provided and Discussed.       Our next appointment is by telephone on 07/13/2022 at 9:00 am.  Please call the care guide team at [phone] if you need to cancel or reschedule your appointment.   If you are experiencing a Mental Health or [place] or need someone to talk to, please call the Suicide and Crisis Lifeline: 988 call the [place] National Suicide Prevention Lifeline: (409) 171-5646 or TTY: 343-515-7166 TTY (515)174-6484) to talk to a trained counselor call 1-800-273-TALK (toll free, 24 hour hotline) go to [facility] [address] [place] [phone]) call the [place]: [phone] call 911  Patient verbalizes understanding of instructions and care plan provided today and agrees to view in [place]. Active MyChart status and patient understanding of how to access instructions and care plan via MyChart confirmed with patient.     Telephone follow up  appointment with care management team member scheduled for:  07/13/2022 at 9:00 am.  [name], BSW, MSW, [place]  Licensed Clinical Social Worker  [place]  Mailing [place]. [address], [place], [place] [address] Physical Address-[address] [place], [place] [address] Toll Free Main # [phone] Fax # [phone] Cell # [phone] Di [email or web address]

## 2022-06-30 NOTE — Patient Outreach (Signed)
  Care Coordination   Follow Up Visit Note   06/30/2022  Name: Adriana [name] MRN: [id number] DOB: 08/13/1960  Adriana [name] is a 62 y.o. year old female who sees Adriana [name], [place] for primary care. I spoke with Adriana [name] by phone today.  What matters to the patients health and wellness today?  Reduce and Manage Symptoms of Anxiety and Depression.    Goals Addressed               This Visit's Progress     Reduce and Manage Symptoms of Anxiety and Depression. (pt-stated)   On track     Care Coordination Interventions:  Active Listening & Reflection Utilized.  Verbalization of Feelings Encouraged.  Emotional Support Provided. Problem-Solving Interventions Activated. Solution-Focused Strategies Employed.   Cognitive Behavioral Therapy Initiated. Client-Centered Solutions Developed. Continue to Receive Ongoing Counseling & Supportive Services through CSW, with [facility] [phone]). Please Begin Implementing the Following Self-Care Activities: ~ Journaling ~ Acupuncture ~ Listening to Music ~ Spirituality ~ Positive Self-Talk Begin Participating in Activities of Interest, From List Provided and Discussed.       SDOH assessments and interventions completed:  Yes.  Care Coordination Interventions:  Yes, provided.   Follow up plan: Follow up call scheduled for 07/13/2022 at 9:00 am.  Encounter Outcome:  Pt. Visit Completed.   [name], BSW, MSW, LCSW  Licensed Education officer, environmental Health System  Mailing [place] [address] [place], [place] [address] Physical Address-[address], [place], [place] [address] Toll Free Main # [phone] Fax # [phone] Cell # [phone] Di [email or web address]

## 2022-07-02 ENCOUNTER — Encounter: Payer: 59 | Admitting: *Deleted

## 2022-07-13 ENCOUNTER — Encounter: Payer: Self-pay | Admitting: *Deleted

## 2022-07-13 ENCOUNTER — Telehealth: Payer: Self-pay | Admitting: Nurse Practitioner

## 2022-07-13 ENCOUNTER — Ambulatory Visit: Payer: Self-pay | Admitting: *Deleted

## 2022-07-13 NOTE — Patient Outreach (Signed)
Care Coordination   Follow Up Visit Note   07/13/2022  Name: Adriana [name] MRN: [id number] DOB: 06/12/1961  Adriana [name] is a 62 y.o. year old female who sees Adriana [name], [place] for primary care. I spoke with Adriana [name] by phone today.  What matters to the patients health and wellness today? Reduce and Manage Symptoms of Anxiety and Depression.   Goals Addressed               This Visit's Progress     Reduce and Manage Symptoms of Anxiety and Depression. (pt-stated)   On track     Care Coordination Interventions:  Active Listening & Reflection Utilized.  Verbalization of Feelings Encouraged.  Emotional Support Provided. Caregiver Stress Acknowledged. Caregiver Resources Reviewed. Caregiver Support Groups Discussed. Self-Enrollment in Caregiver Support Group of Interest Emphasized. Problem-Solving Interventions Activated. Solution-Focused Strategies Employed.   Cognitive Behavioral Therapy Initiated. Client-Centered Solutions Implemented. Acceptance & Commitment Therapy Introduced. Deep Breathing Exercises, Relaxation Techniques & Mindfulness Meditation Strategies Reviewed & Encouraged Daily. Please Continue Implementing the Following Self-Care Activities: ~ Journaling ~ Acupuncture ~ Listening to Music ~ Spirituality ~ Positive Self-Talk Please Begin Reviewing, "How to Heal A Mother-Daughter Relationship" & Begin Implementing the Following Interventions:                  ~ Let Go of Resentment Often, mother-daughter relationships have simmering acrimony stemming from unresolved disagreements. However, holding years-long grudges is terrible for your mental and physical well-being. An inability to forgive and forget increases blood pressure, heart rate and nervous system activity. On the other hand, letting go of anger and bitterness can reduce your stress levels. ~ Agree to Disagree Mothers and daughters must remember that they are separate people  with unique identities. They grew up in different generations and have had distinct milestones and memories shaping their lives. Moms and their children might have longstanding disagreements. It's vital to pinpoint where neither party is willing to budge and agree to accept those choices without judgment or hostility. ~ Set Reasonable, Effective Boundaries Boundaries are the foundation of healthy relationships, and maintaining them is the secret to nipping mother-daughter drama in the bud before it starts. When defining boundaries, be sure to outline any behavior you view as unacceptable, then explain the consequences for stepping over the line. Be specific about the insensitive things your mom or daughter says or does. You can also let her know that if she doesn't change her attitude, you'll start visiting her less to protect your mental health. ~ Find Shared Interests If your mother-daughter relationship has become strained, the idea of spending time together might seem overwhelming. Still, one way to heal is to find a leisure activity you have in common and build a bond around that. If you don't have any hobbies in common, try new things until you find something you both enjoy. ~ Work With a Transport planner The mother-daughter relationship is central to [employer] identities and self-image. If you have a fraught relationship with your mother and fail to address it, you risk perpetuating the cycle of intergenerational trauma. Children who grow up with unaffectionate or emotionally distant mothers tend to be more vulnerable to developing mental and behavioral health problems later in life. A therapist can help you get past any guilt or shame you feel about your perceived failures or inadequacies, so you stop blaming yourself for your relationship difficulties.       SDOH assessments and interventions completed:  Yes.  Care Coordination  Interventions:  Yes, provided.   Follow up plan: Follow up call scheduled  for 08/02/2022 at 11:00 am.  Encounter Outcome:  Pt. Visit Completed.   [name], BSW, MSW, LCSW  Licensed Education officer, environmental Health System  Mailing [place] [address] [place], [place] [address] Physical Address[address], [place], [place] [address] Toll Free Main # [phone] Fax # [phone] Cell # [phone] Di [email or web address]

## 2022-07-13 NOTE — Patient Instructions (Signed)
Visit Information  Thank you for taking time to visit with me today. Please don't hesitate to contact me if I can be of assistance to you.   Following are the goals we discussed today:   Goals Addressed               This Visit's Progress     Reduce and Manage Symptoms of Anxiety and Depression. (pt-stated)   On track     Care Coordination Interventions:  Active Listening & Reflection Utilized.  Verbalization of Feelings Encouraged.  Emotional Support Provided. Caregiver Stress Acknowledged. Caregiver Resources Reviewed. Caregiver Support Groups Discussed. Self-Enrollment in Caregiver Support Group of Interest Emphasized. Problem-Solving Interventions Activated. Solution-Focused Strategies Employed.   Cognitive Behavioral Therapy Initiated. Client-Centered Solutions Implemented. Acceptance & Commitment Therapy Introduced. Deep Breathing Exercises, Relaxation Techniques & Mindfulness Meditation Strategies Reviewed & Encouraged Daily. Please Continue Implementing the Following Self-Care Activities: ~ Journaling ~ Acupuncture ~ Listening to Music ~ Spirituality ~ Positive Self-Talk Please Begin Reviewing, "How to Heal A Mother-Daughter Relationship" & Begin Implementing the Following Interventions:           ~ Let Go of Resentment Often, mother-daughter relationships have simmering acrimony stemming from unresolved disagreements. However, holding years-long grudges is terrible for your mental and physical well-being. An inability to forgive and forget increases blood pressure, heart rate and nervous system activity. On the other hand, letting go of anger and bitterness can reduce your stress levels. ~ Agree to Disagree Mothers and daughters must remember that they are separate people with unique identities. They grew up in different generations and have had distinct milestones and memories shaping their lives. Moms and their children might have longstanding disagreements. It's  vital to pinpoint where neither party is willing to budge and agree to accept those choices without judgment or hostility. ~ Set Reasonable, Effective Boundaries Boundaries are the foundation of healthy relationships, and maintaining them is the secret to nipping mother-daughter drama in the bud before it starts. When defining boundaries, be sure to outline any behavior you view as unacceptable, then explain the consequences for stepping over the line. Be specific about the insensitive things your mom or daughter says or does. You can also let her know that if she doesn't change her attitude, you'll start visiting her less to protect your mental health. ~ Find Shared Interests If your mother-daughter relationship has become strained, the idea of spending time together might seem overwhelming. Still, one way to heal is to find a leisure activity you have in common and build a bond around that. If you don't have any hobbies in common, try new things until you find something you both enjoy. ~ Work With a Transport planner The mother-daughter relationship is central to [employer] identities and self-image. If you have a fraught relationship with your mother and fail to address it, you risk perpetuating the cycle of intergenerational trauma. Children who grow up with unaffectionate or emotionally distant mothers tend to be more vulnerable to developing mental and behavioral health problems later in life. A therapist can help you get past any guilt or shame you feel about your perceived failures or inadequacies, so you stop blaming yourself for your relationship difficulties.       Our next appointment is by telephone on 08/02/2022 at 11:00 am.  Please call the care guide team at [phone] if you need to cancel or reschedule your appointment.   If you are experiencing a Mental Health or [place] or need someone to talk  to, please call the Suicide and Crisis Lifeline: 988 call the [place] National  Suicide Prevention Lifeline: 906-153-8075 or TTY: 605 534 5338 TTY 9393821282) to talk to a trained counselor call 1-800-273-TALK (toll free, 24 hour hotline) go to [facility] [address], [place] [phone]) call the [place]: [phone] call 911  Patient verbalizes understanding of instructions and care plan provided today and agrees to view in . Active MyChart status and patient understanding of how to access instructions and care plan via MyChart confirmed with patient.     Telephone follow up appointment with care management team member scheduled for:  08/02/2022 at 11:00 am.  [name], BSW, MSW, [place]  Licensed Clinical Social Worker  [place]  Mailing [place]. [address], [place], [place] [address] Physical Address-[address] [place], [place] [address] Toll Free Main # [phone] Fax # [phone] Cell # [phone] Di [email or web address]

## 2022-07-13 NOTE — Telephone Encounter (Signed)
Patient has an appoint on 1/31 and wants to know if she can possibly have it done over the phone. Please advise.

## 2022-07-13 NOTE — Telephone Encounter (Signed)
Spoke with pt. Let pt know that Nevin Bloodgood doesn't usually do virtual appointments and we could reschedule to another day. Pt stated she would be able to make in-person appointment that is scheduled.

## 2022-07-18 ENCOUNTER — Encounter: Payer: Self-pay | Admitting: Nurse Practitioner

## 2022-07-18 ENCOUNTER — Ambulatory Visit: Payer: 59 | Admitting: Nurse Practitioner

## 2022-07-18 VITALS — BP 120/88 | HR 73 | Ht 66.0 in | Wt 256.0 lb

## 2022-07-18 DIAGNOSIS — K219 Gastro-esophageal reflux disease without esophagitis: Secondary | ICD-10-CM | POA: Diagnosis not present

## 2022-07-18 DIAGNOSIS — Z86010 Personal history of colonic polyps: Secondary | ICD-10-CM

## 2022-07-18 MED ORDER — OMEPRAZOLE 40 MG PO CPDR: DELAYED_RELEASE_CAPSULE | ORAL | 5 refills | Status: DC

## 2022-07-18 MED ORDER — NA SULFATE-K SULFATE-MG SULF 17.5-3.13-1.6 GM/177ML PO SOLN: 1.0000 | Freq: Once | ORAL | 0 refills | Status: AC

## 2022-07-18 NOTE — Progress Notes (Signed)
Assessment    Patient profile:  Adriana [name] is a 62 y.o. year old female , known to Dr. Silverio [name] with a past medical history of GERD /esophagitis, fundic gland polyps and colon polyps.  Patient is referred by GYN for colon cancer screening. See PMH / [place] for additional history   # 62 yo female with a history of adenomatous colon polyps. Three tubular adenomas ( < 10 mm) removed in 2019 . She is overdue for recommended three year follow up colonoscopy.   # Chronic GERD / history LA grade C esophagitis in 2019. She stopped Omeprazole several years ago due to concerns about long term side effects of PPIs. She gets almost daily heartburn off treatment.   # Chronic solid food dysphagia .  Not dilated at time of EGD in 2019 (presumably due to the degree of esophagitis that was present)    Plan:    She is concerned about possible side effects of long term PPIs. which we discussed. Also discussed that there are risks of untreated acid reflux.  Resume Omeprazole 40 mg 30 minutes prior to breakfast.  Discussed anti-reflux measures such as avoidance of late meals / bedtime snacks, HOB elevation (or use of wedge pillow), weight reduction ( if applicable)  / maintaining a healthy BMI ( body mass index),  and avoidance of trigger foods and caffeine.  Schedule surveillance colonoscopy  Schedule for EGD at time of colonoscopy . The risks and benefits of EGD and colonoscopy with possible biopsies were discussed with the patient who agrees to proceed. Hopefully back on PPI there will not be any significant esophagitis precluding esophageal dilation.    HPI:    Chief Complaint: Due for colonoscopy, also wants to discuss reflux  Patient is here today to get scheduled for her surveillance colonoscopy. No bowel changes. No blood in stool. No interval changes in Adriana [name].   Adriana [name] also asked about doing an upper endoscopy at the time of colonoscopy.  She continues to have intermittent solid food  dysphagia.  She stopped PPI several years ago due to concerns about potential side effects of long-term treatment.  She gets heartburn almost on a daily basis.  Lately she has been using vinegar to treat her reflux symptoms.  Previous Labs / Imaging::    Latest Ref Rng & Units 08/17/2021    2:35 PM 02/24/2021   11:29 AM 11/13/2019   11:12 AM  CBC  WBC 3.4 - 10.8 x10E3/uL 6.2  4.7  4.4   Hemoglobin 11.1 - 15.9 g/dL 13.7  12.6  12.5   Hematocrit 34.0 - 46.6 % 40.8  38.5  37.8   Platelets 150 - 450 x10E3/uL 227   214     Previous GI Evaluation   Screening colonoscopy Nov 2019    EGD Nov 2019 for dysphagia and GERD -LA grade C reflux esophagitis.  Biopsied.  Gastritis.  Biopsied.  Multiple gastric polyps.  Resected and retrieved.  Diagnosis 1. Surgical [P], random gastric - CHRONIC INACTIVE GASTRITIS, MILD. - THERE IS NO EVIDENCE OF DYSPLASIA OR MALIGNANCY. - SEE COMMENT. 2. Surgical [P], gastric polyps - FUNDIC GLAND POLYP(S). - THERE IS NO EVIDENCE OF MALIGNANCY IN THE SECTIONS EXAMINED. 3. Surgical [P], ascending x3, polyp (3) - TUBULAR ADENOMA(S). - HIGH GRADE DYSPLASIA IS NOT IDENTIFIED. 4. Surgical [P], sigmoid x2, polyp (2) - HYPERPLASTIC POLYP(S). - THERE IS NO EVIDENCE OF MALIGNANCY.  Past Medical History:  Diagnosis Date   Anemia    history  Anxiety    Benign positional vertigo 12/19/2013   Depression    Dyspnea    with exertion   Edema    Endometrial polyp    Fibroids    GERD (gastroesophageal reflux disease)    Insomnia    Internal and external hemorrhoids without complication    Migraine    Morbid obesity ([place])    Plantar fasciitis    PMB (postmenopausal bleeding)    Prediabetes    Rectal bleeding    Reflux    Past Surgical History:  Procedure Laterality Date   DILATATION & CURETTAGE/HYSTEROSCOPY WITH MYOSURE N/A 07/23/2017   Procedure: DILATATION & CURETTAGE/HYSTEROSCOPY WITH MYOSURE;  Surgeon: Salvadore Dom, MD;  Location: [facility];  Service: Gynecology;  Laterality: N/A;  endometrial polyp   DILATION AND CURETTAGE OF UTERUS     HYSTEROSCOPY     None     Family History  Problem Relation Age of Onset   Colon cancer Father        diagnosed in late 5s.    Hypertension Father    Heart attack Father    Esophageal cancer Neg Hx    Breast cancer Neg Hx    Cervical cancer Neg Hx    Social History   Tobacco Use   Smoking status: Never    Passive exposure: Never   Smokeless tobacco: Never  Vaping Use   Vaping Use: Never used  Substance Use Topics   Alcohol use: Yes    Comment: occ   Drug use: No   Current Outpatient Medications  Medication Sig Dispense Refill   betamethasone valerate ointment (VALISONE) 0.1 % Apply 1 Application topically 2 (two) times daily. Use for up to 1-2 weeks as needed. (Patient not taking: Reported on 07/18/2022) 30 g 0   Cholecalciferol (VITAMIN D3) 25 MCG (1000 UT) CAPS Take 1 capsule (1,000 Units total) by mouth daily. (Patient not taking: Reported on 07/18/2022) 60 capsule 3   Magnesium 400 MG TABS Take 1 tablet by mouth as needed. (Patient not taking: Reported on 07/18/2022)     MELATONIN PO Take by mouth. As needed for sleep (Patient not taking: Reported on 07/18/2022)     metroNIDAZOLE (METROGEL) 0.75 % vaginal gel Place 1 Applicatorful vaginally at bedtime. Use nightly x 5 nights (Patient not taking: Reported on 07/18/2022) 70 g 0   valACYclovir (VALTREX) 500 MG tablet Take one tablet po BID x 3 days prn (Patient not taking: Reported on 07/18/2022) 30 tablet 1   No current facility-administered medications for this visit.   Allergies  Allergen Reactions   Hydrocodone Nausea And Vomiting    Other reaction(s): Unknown     Review of Systems: All systems reviewed and negative except where noted in HPI.   Wt Readings from Last 3 Encounters:  07/18/22 256 lb (116.1 kg)  05/14/22 261 lb (118.4 kg)  03/08/22 258 lb (117 kg)    Physical Exam   BP 120/88   Pulse  73   Ht '5\' 6"'$  (1.676 m)   Wt 256 lb (116.1 kg)   LMP 01/16/2014   SpO2 98%   BMI 41.32 kg/m  Constitutional:  Generally well appearing female in no acute distress. Psychiatric: Pleasant. Normal mood and affect. Behavior is normal. EENT: Pupils normal.  Conjunctivae are normal. No scleral icterus. Neck supple.  Cardiovascular: Normal rate, regular rhythm.  Pulmonary/chest: Effort normal and breath sounds normal. No wheezing, rales or rhonchi. Abdominal: Soft, nondistended, nontender. Bowel sounds active throughout. There  are no masses palpable. No hepatomegaly. Neurological: Alert and oriented to person place and time. Skin: Skin is warm and dry. No rashes noted.  Tye Savoy, NP  07/18/2022, 11:39 AM  Cc:  Referring Provider Sumner Boast, MD

## 2022-07-18 NOTE — Patient Instructions (Addendum)
_______________________________________________________  If your blood pressure at your visit was 140/90 or greater, please contact your primary care physician to follow up on this.  _______________________________________________________  If you are age 60 or older, your body mass index should be between 23-30. Your Body mass index is 41.32 kg/m. If this is out of the aforementioned range listed, please consider follow up with your Primary Care Provider.  If you are age 18 or younger, your body mass index should be between 19-25. Your Body mass index is 41.32 kg/m. If this is out of the aformentioned range listed, please consider follow up with your Primary Care Provider.   ________________________________________________________  The [facility] providers would like to encourage you to use [facility] to communicate with providers for non-urgent requests or questions.  Due to long hold times on the telephone, sending your provider a message by [facility] may be a faster and more efficient way to get a response.  Please allow 48 business hours for a response.  Please remember that this is for non-urgent requests.  _______________________________________________________  Dennis Bast have been scheduled for an endoscopy and colonoscopy. Please follow the written instructions given to you at your visit today. Please pick up your prep supplies at the pharmacy within the next 1-3 days. If you use inhalers (even only as needed), please bring them with you on the day of your procedure.   Acid Reflux  Below are some measures you can take to possibly improve acid reflux symptoms . We may have discussed some of these today in the office. Not everything on this list may apply to you   --If you are taking anti-reflux ( GERD) medication be sure to take it 30 minutes before breakfast and if taking twice daily then also second dose should be 30 minutes before dinner.   --Avoid late meals / bedtime snacks.   --Avoid  trigger foods ( foods which you know tend to aggravate you reflux symptoms). Some common trigger foods include spicy foods, fatty foods, acidic foods, chocolate and caffeine.  --Elevate the head of bed 6-8 inches on blocks or bricks. If not able to elevate the head of the bed consider purchasing a wedge pillow to sleep on.    --Weight reduction / maintain a healthy BMI ( body mass index) may be help with reflux symptoms  --Sometimes with the above mentioned "lifestyle changes" patients are able to reduce the amount of GERD medications they take. Our goal is to have you on the lowest effective dose of medication  It was a pleasure to see you today!  Thank you for trusting me with your gastrointestinal care!

## 2022-07-19 ENCOUNTER — Telehealth: Payer: Self-pay

## 2022-07-19 ENCOUNTER — Other Ambulatory Visit: Payer: Self-pay

## 2022-07-19 DIAGNOSIS — B9689 Other specified bacterial agents as the cause of diseases classified elsewhere: Secondary | ICD-10-CM

## 2022-07-19 DIAGNOSIS — Z8619 Personal history of other infectious and parasitic diseases: Secondary | ICD-10-CM

## 2022-07-19 DIAGNOSIS — N760 Acute vaginitis: Secondary | ICD-10-CM

## 2022-07-19 MED ORDER — BETAMETHASONE VALERATE 0.1 % EX OINT: 1.0000 | TOPICAL_OINTMENT | Freq: Two times a day (BID) | CUTANEOUS | 0 refills | Status: DC

## 2022-07-19 MED ORDER — METRONIDAZOLE 0.75 % VA GEL: 1.0000 | Freq: Every day | VAGINAL | 0 refills | Status: DC

## 2022-07-19 MED ORDER — VALACYCLOVIR HCL 500 MG PO TABS: ORAL_TABLET | ORAL | 1 refills | Status: DC

## 2022-07-19 NOTE — Telephone Encounter (Signed)
Error - duplicate

## 2022-07-19 NOTE — Telephone Encounter (Signed)
Patient called states at last visit 05/14/22  3 Rx's were sent to [facility].   4. Acute vaginitis - betamethasone valerate ointment (VALISONE) 0.1 %; Apply 1 Application topically 2 (two) times daily. Use for up to 1-2 weeks as needed.  Dispense: 30 g; Refill: 0   5. Bacterial vaginitis - metroNIDAZOLE (METROGEL) 0.75 % vaginal gel; Place 1 Applicatorful vaginally at bedtime. Use nightly x 5 nights  Dispense: 70 g; Refill: 0  7. History of herpes genitalis - valACYclovir (VALTREX) 500 MG tablet; Take one tablet po BID x 3 days prn  Dispense: 30 tablet; Refill: 1  Patient said that [facility] would not fill them because her insurance required she [facility].  She would [facility] to [facility]. Pharmacy set. Rxs attached to encounter for signature.

## 2022-07-20 NOTE — Telephone Encounter (Signed)
Patient notified. Encounter closed

## 2022-08-02 ENCOUNTER — Encounter: Payer: Self-pay | Admitting: *Deleted

## 2022-08-02 ENCOUNTER — Ambulatory Visit: Payer: Self-pay | Admitting: *Deleted

## 2022-08-02 NOTE — Patient Outreach (Addendum)
Care Coordination   Follow Up Visit Note   08/02/2022  Name: Adriana [name] MRN: [id number] DOB: Apr 08, 1961  Adriana [name] is a 62 y.o. year old female who sees Alvira Monday, [place] for primary care. I spoke with Connor G Chakraborty by phone today.  What matters to the patients health and wellness today?  Reduce and Manage Symptoms of Anxiety and Depression.    Goals Addressed               This Visit's Progress     Reduce and Manage Symptoms of Anxiety and Depression. (pt-stated)   On track     Care Coordination Interventions:  Interventions Today    Flowsheet Row Most Recent Value  Chronic Disease   Chronic disease during today's visit Other  [Anxiety, Depression & Insomnia]  General Interventions   General Interventions Discussed/Reviewed General Interventions Discussed, General Interventions Reviewed, Annual Eye Exam, Labs, Durable Medical Equipment (DME), Vaccines, Health Screening, [employer], Doctor Visits, Communication with  [employer] Care Provider]  Labs Hgb A1c annually  Vaccines COVID-19, Flu, Pneumonia, RSV, Shingles  [Encouraged]  Doctor Visits Discussed/Reviewed Doctor Visits Discussed, Doctor Visits Reviewed, Annual Wellness Visits, PCP, Specialist  Health Screening Colonoscopy, Mammogram  [Encouraged]  PCP/Specialist Visits Compliance with follow-up visit  Communication with PCP/Specialists  Exercise Interventions   Exercise Discussed/Reviewed Exercise Discussed, Exercise Reviewed, Physical Activity, Weight Managment, Assistive device use and maintanence  Physical Activity Discussed/Reviewed Physical Activity Discussed, Physical Activity Reviewed, Types of exercise  Weight Management Weight loss  Education Interventions   Education Provided Provided Printed Education, Provided Education  Provided Verbal Education On Nutrition, Eye Care, Labs, Mental Health/Coping with Illness, Exercise, Medication, When to see the doctor, [place] Discussed/Reviewed Mental Health Discussed, Mental Health Reviewed, Coping Strategies, Crisis, Anxiety, Depression  Nutrition Interventions   Nutrition Discussed/Reviewed Nutrition Discussed, Nutrition Reviewed, Portion sizes, Decreasing sugar intake, Decreasing fats, Decreasing salt  Pharmacy Interventions   Pharmacy Dicussed/Reviewed Pharmacy Topics Discussed, Pharmacy Topics Reviewed, Medication Adherence, Affording Medications  Safety Interventions   Safety Discussed/Reviewed Safety Discussed, Safety Reviewed  Advanced Directive Interventions   Advanced Directives Discussed/Reviewed Advanced Directives Discussed, Advanced Directives Reviewed     Active Listening & Reflection Utilized.  Verbalization of Feelings Encouraged.  Emotional Support Provided. Caregiver Stress Acknowledged. Caregiver Resources Reviewed. Caregiver Support Groups Discussed. Self-Enrollment in Caregiver Support Group of Interest Emphasized. Problem-Solving Interventions Activated. Task-Centered Activities Enacted. Solution-Focused Strategies Employed.   Cognitive Behavioral Therapy Initiated. Client-Centered Solutions Implemented. Acceptance & Commitment Therapy Performed. Deep Breathing Exercises, Relaxation Techniques & Mindfulness Meditation Strategies Encouraged Daily. Thorough Review of "How to Heal A Mother-Daughter Relationship" & Continued Implementation of Interventions Encouraged:                   ~ Let Go of Resentment Often, mother-daughter relationships have simmering acrimony stemming from unresolved disagreements. However, holding years-long grudges is terrible for your mental and physical well-being. An inability to forgive and forget increases blood pressure, heart rate and nervous system activity. On the other hand, letting go of anger and bitterness can reduce your stress levels. ~ Agree to Disagree Mothers and daughters must remember that they  are separate people with unique identities. They grew up in different generations and have had distinct milestones and memories shaping their lives. Moms and their children might have longstanding disagreements. It's vital to pinpoint where neither party is willing to budge and agree to accept those choices without judgment or  hostility. ~ Set Reasonable, Effective Boundaries Boundaries are the foundation of healthy relationships, and maintaining them is the secret to nipping mother-daughter drama in the bud before it starts. When defining boundaries, be sure to outline any behavior you view as unacceptable, then explain the consequences for stepping over the line. Be specific about the insensitive things your mom or daughter says or does. You can also let her know that if she doesn't change her attitude, you'll start visiting her less to protect your mental health. ~ Find Shared Interests If your mother-daughter relationship has become strained, the idea of spending time together might seem overwhelming. Still, one way to heal is to find a leisure activity you have in common and build a bond around that. If you don't have any hobbies in common, try new things until you find something you both enjoy. ~ Work With a Transport planner The mother-daughter relationship is central to [employer] identities and self-image. If you have a fraught relationship with your mother and fail to address it, you risk perpetuating the cycle of intergenerational trauma. Children who grow up with unaffectionate or emotionally distant mothers tend to be more vulnerable to developing mental and behavioral health problems later in life. A therapist can help you get past any guilt or shame you feel about your perceived failures or inadequacies, so you stop blaming yourself for your relationship difficulties.       SDOH assessments and interventions completed:  Yes.  Care Coordination Interventions:  Yes, provided.   Follow up plan:  Follow up call scheduled for 08/16/2022 at 10:15 am.  Encounter Outcome:  Pt. Visit Completed.   [name], BSW, MSW, LCSW  Licensed Education officer, [facility]  Mailing [place] [address] [place], [place] [address] Physical Address-[address], [place], [place] [address] Toll Free Main # [phone] Fax # [phone] Cell # [phone] [email or web address]

## 2022-08-02 NOTE — Patient Instructions (Signed)
Visit Information  Thank you for taking time to visit with me today. Please don't hesitate to contact me if I can be of assistance to you.   Following are the goals we discussed today:   Goals Addressed               This Visit's Progress     Reduce and Manage Symptoms of Anxiety and Depression. (pt-stated)   On track     Care Coordination Interventions:  Interventions Today    Flowsheet Row Most Recent Value  Chronic Disease   Chronic disease during today's visit Other  [Anxiety, Depression & Insomnia]  General Interventions   General Interventions Discussed/Reviewed General Interventions Discussed, General Interventions Reviewed, Annual Eye Exam, Labs, Durable Medical Equipment (DME), Vaccines, Health Screening, [employer], Doctor Visits, Communication with  [employer] Care Provider]  Labs Hgb A1c annually  Vaccines COVID-19, Flu, Pneumonia, RSV, Shingles  [Encouraged]  Doctor Visits Discussed/Reviewed Doctor Visits Discussed, Doctor Visits Reviewed, Annual Wellness Visits, PCP, Specialist  Health Screening Colonoscopy, Mammogram  [Encouraged]  PCP/Specialist Visits Compliance with follow-up visit  Communication with PCP/Specialists  Exercise Interventions   Exercise Discussed/Reviewed Exercise Discussed, Exercise Reviewed, Physical Activity, Weight Managment, Assistive device use and maintanence  Physical Activity Discussed/Reviewed Physical Activity Discussed, Physical Activity Reviewed, Types of exercise  Weight Management Weight loss  Education Interventions   Education Provided Provided Printed Education, Provided Education  Provided Verbal Education On Nutrition, Eye Care, Labs, Mental Health/Coping with Illness, Exercise, Medication, When to see the doctor, [place] Discussed/Reviewed Mental Health Discussed, Mental Health Reviewed, Coping Strategies, Crisis, Anxiety, Depression  Nutrition Interventions    Nutrition Discussed/Reviewed Nutrition Discussed, Nutrition Reviewed, Portion sizes, Decreasing sugar intake, Decreasing fats, Decreasing salt  Pharmacy Interventions   Pharmacy Dicussed/Reviewed Pharmacy Topics Discussed, Pharmacy Topics Reviewed, Medication Adherence, Affording Medications  Safety Interventions   Safety Discussed/Reviewed Safety Discussed, Safety Reviewed  Advanced Directive Interventions   Advanced Directives Discussed/Reviewed Advanced Directives Discussed, Advanced Directives Reviewed     Active Listening & Reflection Utilized.  Verbalization of Feelings Encouraged.  Emotional Support Provided. Caregiver Stress Acknowledged. Caregiver Resources Reviewed. Caregiver Support Groups Discussed. Self-Enrollment in Caregiver Support Group of Interest Emphasized. Problem-Solving Interventions Activated. Task-Centered Activities Enacted. Solution-Focused Strategies Employed.   Cognitive Behavioral Therapy Initiated. Client-Centered Solutions Implemented. Acceptance & Commitment Therapy Performed. Deep Breathing Exercises, Relaxation Techniques & Mindfulness Meditation Strategies Encouraged Daily. Thorough Review of "How to Heal A Mother-Daughter Relationship" & Continued Implementation of Interventions Encouraged:                   ~ Let Go of Resentment Often, mother-daughter relationships have simmering acrimony stemming from unresolved disagreements. However, holding years-long grudges is terrible for your mental and physical well-being. An inability to forgive and forget increases blood pressure, heart rate and nervous system activity. On the other hand, letting go of anger and bitterness can reduce your stress levels. ~ Agree to Disagree Mothers and daughters must remember that they are separate people with unique identities. They grew up in different generations and have had distinct milestones and memories shaping their lives. Moms and their children might have  longstanding disagreements. It's vital to pinpoint where neither party is willing to budge and agree to accept those choices without judgment or hostility. ~ Set Reasonable, Effective Boundaries Boundaries are the foundation of healthy relationships, and maintaining them is the secret to nipping mother-daughter drama in the bud before it starts. When defining boundaries,  be sure to outline any behavior you view as unacceptable, then explain the consequences for stepping over the line. Be specific about the insensitive things your mom or daughter says or does. You can also let her know that if she doesn't change her attitude, you'll start visiting her less to protect your mental health. ~ Find Shared Interests If your mother-daughter relationship has become strained, the idea of spending time together might seem overwhelming. Still, one way to heal is to find a leisure activity you have in common and build a bond around that. If you don't have any hobbies in common, try new things until you find something you both enjoy. ~ Work With a Transport planner The mother-daughter relationship is central to [employer] identities and self-image. If you have a fraught relationship with your mother and fail to address it, you risk perpetuating the cycle of intergenerational trauma. Children who grow up with unaffectionate or emotionally distant mothers tend to be more vulnerable to developing mental and behavioral health problems later in life. A therapist can help you get past any guilt or shame you feel about your perceived failures or inadequacies, so you stop blaming yourself for your relationship difficulties.       Our next appointment is by telephone on 08/16/2022 at 10:15 am.  Please call the care guide team at [phone] if you need to cancel or reschedule your appointment.   If you are experiencing a Mental Health or [place] or need someone to talk to, please call the Suicide and Crisis Lifeline:  988 call the [place] National Suicide Prevention Lifeline: 231-359-3804 or TTY: 352-034-6213 TTY (313)618-3925) to talk to a trained counselor call 1-800-273-TALK (toll free, 24 hour hotline) go to [facility] [address] [place] [address] [place] [phone]) call the [place]: [phone] call 911  Patient verbalizes understanding of instructions and care plan provided today and agrees to view in [place]. Active MyChart status and patient understanding of how to access instructions and care plan via MyChart confirmed with patient.     Telephone follow up appointment with care management team member scheduled for:  08/16/2022 at 10:15 am.  [name], BSW, MSW, [place]  Licensed Clinical Social Worker  [place]  Mailing [place]. [address] [place], [place] [address] Physical Address-[address], [place], [place] [address] Toll Free Main # [phone] Fax # [phone] Cell # [phone] [email or web address]

## 2022-08-16 ENCOUNTER — Ambulatory Visit: Payer: Self-pay | Admitting: *Deleted

## 2022-08-16 NOTE — Patient Outreach (Signed)
  Care Coordination   08/16/2022  Name: Adriana [name] MRN: [id number] DOB: 09-18-1960   Care Coordination Outreach Attempts:  An unsuccessful telephone outreach was attempted today to offer the patient information about available care coordination services as a benefit of their health plan. HIPAA compliant messages left on voicemail, providing contact information for CSW, encouraging patient to return CSW's call at her earliest convenience.  Follow Up Plan:  Additional outreach attempts will be made to offer the patient care coordination information and services.   Encounter Outcome:  No Answer.   Care Coordination Interventions:  No, not indicated.    [name], BSW, MSW, LCSW  Licensed Education officer, [facility]  Mailing [place] [address] [place], [place] [address] Physical Address-[address] [place],  [address] Toll Free Main # [phone] Fax # [phone] Cell # [phone] [email or web address]

## 2022-08-27 ENCOUNTER — Encounter: Payer: Self-pay | Admitting: *Deleted

## 2022-08-27 ENCOUNTER — Ambulatory Visit: Payer: Self-pay | Admitting: *Deleted

## 2022-08-27 NOTE — Patient Outreach (Signed)
Care Coordination   Follow Up Visit Note   08/27/2022  Name: Adriana [name] MRN: [id number] DOB: 1960/10/27  Adriana [name] is a 62 y.o. year old female who sees Alvira Monday, [place] for primary care. I spoke with Daralyn G Batta by phone today.  What matters to the patients health and wellness today?   Reduce and Manage Symptoms of Anxiety and Depression.   Goals Addressed               This Visit's Progress     Reduce and Manage Symptoms of Anxiety and Depression. (pt-stated)   On track     Care Coordination Interventions:  Interventions Today    Flowsheet Row Most Recent Value  Chronic Disease   Chronic disease during today's visit Other  [Anxiety, Depression & Insomnia]  General Interventions   General Interventions Discussed/Reviewed General Interventions Discussed, General Interventions Reviewed, Annual Eye Exam, Labs, Durable Medical Equipment (DME), Vaccines, Health Screening, [employer], Doctor Visits, Communication with  [employer] Care Provider]  Labs Hgb A1c annually  Vaccines COVID-19, Flu, Pneumonia, RSV, Shingles  [Encouraged]  Doctor Visits Discussed/Reviewed Doctor Visits Discussed, Doctor Visits Reviewed, Annual Wellness Visits, PCP, Specialist  Health Screening Colonoscopy, Mammogram  [Encouraged]  PCP/Specialist Visits Compliance with follow-up visit  Communication with PCP/Specialists  Exercise Interventions   Exercise Discussed/Reviewed Exercise Discussed, Exercise Reviewed, Physical Activity, Weight Managment, Assistive device use and maintanence  Physical Activity Discussed/Reviewed Physical Activity Discussed, Physical Activity Reviewed, Types of exercise  Weight Management Weight loss  Education Interventions   Education Provided Provided Printed Education, Provided Education  Provided Verbal Education On Nutrition, Eye Care, Labs, Mental Health/Coping with Illness, Exercise, Medication, When to see the doctor, [place] Discussed/Reviewed Mental Health Discussed, Mental Health Reviewed, Coping Strategies, Crisis, Anxiety, Depression  Nutrition Interventions   Nutrition Discussed/Reviewed Nutrition Discussed, Nutrition Reviewed, Portion sizes, Decreasing sugar intake, Decreasing fats, Decreasing salt  Pharmacy Interventions   Pharmacy Dicussed/Reviewed Pharmacy Topics Discussed, Pharmacy Topics Reviewed, Medication Adherence, Affording Medications  Safety Interventions   Safety Discussed/Reviewed Safety Discussed, Safety Reviewed  Advanced Directive Interventions   Advanced Directives Discussed/Reviewed Advanced Directives Discussed, Advanced Directives Reviewed     Active Listening & Reflection Utilized.  Verbalization of Feelings Encouraged.  Emotional Support Provided. Caregiver Stress Acknowledged. Caregiver Resources Reviewed. Caregiver Support Groups Discussed. Self-Enrollment in Caregiver Support Group of Interest Emphasized. Problem-Solving Interventions Activated. Task-Centered Activities Enacted. Solution-Focused Strategies Employed.   Cognitive Behavioral Therapy Initiated. Client-Centered Solutions Implemented. Acceptance & Commitment Therapy Performed. Deep Breathing Exercises, Relaxation Techniques & Mindfulness Meditation Strategies Encouraged Daily. Continued Review of "How to Heal A Mother-Daughter Relationship" & Continued Implementation of Interventions Encouraged:                   ~ Let Go of Resentment. Often, mother-daughter relationships have simmering acrimony stemming from unresolved disagreements. However, holding years-long grudges is terrible for your mental and physical well-being. An inability to forgive and forget increases blood pressure, heart rate and nervous system activity. On the other hand, letting go of anger and bitterness can reduce your stress levels. ~ Agree to Disagree. Mothers and daughters must remember that  they are separate people with unique identities. They grew up in different generations and have had distinct milestones and memories shaping their lives. Moms and their children might have longstanding disagreements. It's vital to pinpoint where neither party is willing to budge and agree to accept those choices without judgment or  hostility. ~ Set Reasonable, Effective Boundaries. Boundaries are the foundation of healthy relationships, and maintaining them is the secret to nipping mother-daughter drama in the bud before it starts. When defining boundaries, be sure to outline any behavior you view as unacceptable, then explain the consequences for stepping over the line. Be specific about the insensitive things your mom or daughter says or does. You can also let her know that if she doesn't change her attitude, you'll start visiting her less to protect your mental health. ~ Find Shared Interests. If your mother-daughter relationship has become strained, the idea of spending time together might seem overwhelming. Still, one way to heal is to find a leisure activity you have in common and build a bond around that. If you don't have any hobbies in common, try new things until you find something you both enjoy. ~ Work With a Transport planner. The mother-daughter relationship is central to [employer] identities and self-image. If you have a fraught relationship with your mother and fail to address it, you risk perpetuating the cycle of intergenerational trauma. Children who grow up with unaffectionate or emotionally distant mothers tend to be more vulnerable to developing mental and behavioral health problems later in life. A therapist can help you get past any guilt or shame you feel about your perceived failures or inadequacies, so you stop blaming yourself for your relationship difficulties. Please Review "12 Ways to Deal With a Disrespectful Grown Child", Emailed on 08/27/2022 & Be Prepared to Discuss During Next  Scheduled Telephone Outreach Call with CSW: ~ Take a Deep Breath Before Responding. ~ Remain Respectful. ~ Set Realistic Boundaries. ~ Acknowledge Your Mistakes. ~ Be Open to Listening & Empathizing. ~ Focus on The Present. ~ Examine Your Parenting Style. ~ Set Realistic Expectations. ~ Be a [place]. ~ Know Your Value. ~ Be Consistent ~ Examine If Behavior Is a Cry For Help.      SDOH assessments and interventions completed:  Yes.  Care Coordination Interventions:  Yes, provided.   Follow up plan: Follow up call scheduled for 09/10/2022 9:45 am.  Encounter Outcome:  Pt. Visit Completed.   [name], BSW, MSW, LCSW  Licensed Education officer, environmental Health System  Mailing [place] [address] [place], [place] [address] Physical Address-[address] [place], [place] [address] Toll Free Main # [phone] Fax # [phone] Cell # [phone] Di [email or web address]

## 2022-08-27 NOTE — Patient Instructions (Signed)
Visit Information  Thank you for taking time to visit with me today. Please don't hesitate to contact me if I can be of assistance to you.   Following are the goals we discussed today:   Goals Addressed               This Visit's Progress     Reduce and Manage Symptoms of Anxiety and Depression. (pt-stated)   On track     Care Coordination Interventions:  Interventions Today    Flowsheet Row Most Recent Value  Chronic Disease   Chronic disease during today's visit Other  [Anxiety, Depression & Insomnia]  General Interventions   General Interventions Discussed/Reviewed General Interventions Discussed, General Interventions Reviewed, Annual Eye Exam, Labs, Durable Medical Equipment (DME), Vaccines, Health Screening, [employer], Doctor Visits, Communication with  [employer] Care Provider]  Labs Hgb A1c annually  Vaccines COVID-19, Flu, Pneumonia, RSV, Shingles  [Encouraged]  Doctor Visits Discussed/Reviewed Doctor Visits Discussed, Doctor Visits Reviewed, Annual Wellness Visits, PCP, Specialist  Health Screening Colonoscopy, Mammogram  [Encouraged]  PCP/Specialist Visits Compliance with follow-up visit  Communication with PCP/Specialists  Exercise Interventions   Exercise Discussed/Reviewed Exercise Discussed, Exercise Reviewed, Physical Activity, Weight Managment, Assistive device use and maintanence  Physical Activity Discussed/Reviewed Physical Activity Discussed, Physical Activity Reviewed, Types of exercise  Weight Management Weight loss  Education Interventions   Education Provided Provided Printed Education, Provided Education  Provided Verbal Education On Nutrition, Eye Care, Labs, Mental Health/Coping with Illness, Exercise, Medication, When to see the doctor, [place] Discussed/Reviewed Mental Health Discussed, Mental Health Reviewed, Coping Strategies, Crisis, Anxiety, Depression  Nutrition Interventions    Nutrition Discussed/Reviewed Nutrition Discussed, Nutrition Reviewed, Portion sizes, Decreasing sugar intake, Decreasing fats, Decreasing salt  Pharmacy Interventions   Pharmacy Dicussed/Reviewed Pharmacy Topics Discussed, Pharmacy Topics Reviewed, Medication Adherence, Affording Medications  Safety Interventions   Safety Discussed/Reviewed Safety Discussed, Safety Reviewed  Advanced Directive Interventions   Advanced Directives Discussed/Reviewed Advanced Directives Discussed, Advanced Directives Reviewed     Active Listening & Reflection Utilized.  Verbalization of Feelings Encouraged.  Emotional Support Provided. Caregiver Stress Acknowledged. Caregiver Resources Reviewed. Caregiver Support Groups Discussed. Self-Enrollment in Caregiver Support Group of Interest Emphasized. Problem-Solving Interventions Activated. Task-Centered Activities Enacted. Solution-Focused Strategies Employed.   Cognitive Behavioral Therapy Initiated. Client-Centered Solutions Implemented. Acceptance & Commitment Therapy Performed. Deep Breathing Exercises, Relaxation Techniques & Mindfulness Meditation Strategies Encouraged Daily. Continued Review of "How to Heal A Mother-Daughter Relationship" & Continued Implementation of Interventions Encouraged:                   ~ Let Go of Resentment. Often, mother-daughter relationships have simmering acrimony stemming from unresolved disagreements. However, holding years-long grudges is terrible for your mental and physical well-being. An inability to forgive and forget increases blood pressure, heart rate and nervous system activity. On the other hand, letting go of anger and bitterness can reduce your stress levels. ~ Agree to Disagree. Mothers and daughters must remember that they are separate people with unique identities. They grew up in different generations and have had distinct milestones and memories shaping their lives. Moms and their children might have  longstanding disagreements. It's vital to pinpoint where neither party is willing to budge and agree to accept those choices without judgment or hostility. ~ Set Reasonable, Effective Boundaries. Boundaries are the foundation of healthy relationships, and maintaining them is the secret to nipping mother-daughter drama in the bud before it starts. When defining boundaries,  be sure to outline any behavior you view as unacceptable, then explain the consequences for stepping over the line. Be specific about the insensitive things your mom or daughter says or does. You can also let her know that if she doesn't change her attitude, you'll start visiting her less to protect your mental health. ~ Find Shared Interests. If your mother-daughter relationship has become strained, the idea of spending time together might seem overwhelming. Still, one way to heal is to find a leisure activity you have in common and build a bond around that. If you don't have any hobbies in common, try new things until you find something you both enjoy. ~ Work With a Transport planner. The mother-daughter relationship is central to [employer] identities and self-image. If you have a fraught relationship with your mother and fail to address it, you risk perpetuating the cycle of intergenerational trauma. Children who grow up with unaffectionate or emotionally distant mothers tend to be more vulnerable to developing mental and behavioral health problems later in life. A therapist can help you get past any guilt or shame you feel about your perceived failures or inadequacies, so you stop blaming yourself for your relationship difficulties. Please Review "12 Ways to Deal With a Disrespectful Grown Child", Emailed on 08/27/2022 & Be Prepared to Discuss During Next Scheduled Telephone Outreach Call with CSW: ~ Take a Deep Breath Before Responding. ~ Remain Respectful. ~ Set Realistic Boundaries. ~ Acknowledge Your Mistakes. ~ Be Open to Listening &  Empathizing. ~ Focus on The Present. ~ Examine Your Parenting Style. ~ Set Realistic Expectations. ~ Be a [place]. ~ Know Your Value. ~ Be Consistent ~ Examine If Behavior Is a Cry For Help.      Our next appointment is by telephone on 09/10/2022 9:45 am.  Please call the care guide team at [phone] if you need to cancel or reschedule your appointment.   If you are experiencing a Mental Health or [place] or need someone to talk to, please call the Suicide and Crisis Lifeline: 988 call the [place] National Suicide Prevention Lifeline: 939-741-1663 or TTY: (517)443-5033 TTY 506-692-0328) to talk to a trained counselor call 1-800-273-TALK (toll free, 24 hour hotline) go to [facility] [address] [place] [phone]) call the [place]: [phone] call 911  Patient verbalizes understanding of instructions and care plan provided today and agrees to view in [place]. Active MyChart status and patient understanding of how to access instructions and care plan via MyChart confirmed with patient.     Telephone follow up appointment with care management team member scheduled for:  09/10/2022 9:45 am.  [name], BSW, MSW, [place]  Licensed Clinical Social Worker  [place]  Mailing [place]. [address] [place], [place] [address] Physical Address-[address] [place],  [address] Toll Free Main # [phone] Fax # [phone] Cell # [phone] Di [email or web address]

## 2022-09-10 ENCOUNTER — Ambulatory Visit: Payer: Self-pay | Admitting: *Deleted

## 2022-09-10 ENCOUNTER — Encounter: Payer: Self-pay | Admitting: *Deleted

## 2022-09-10 NOTE — Patient Outreach (Signed)
Care Coordination   Follow Up Visit Note   09/10/2022  Name: Adriana [name] MRN: [id number] DOB: Mar 15, 1961  Adriana [name] is a 62 y.o. year old female who sees Alvira Monday, [place] for primary care. I spoke with Adriana [name] by phone today.  What matters to the patients health and wellness today?  Reduce and Manage Symptoms of Anxiety and Depression.   Goals Addressed               This Visit's Progress     Reduce and Manage Symptoms of Anxiety and Depression. (pt-stated)   On track     Care Coordination Interventions:  Interventions Today    Flowsheet Row Most Recent Value  Chronic Disease   Chronic disease during today's visit Other  [Anxiety, Depression & Insomnia]  General Interventions   General Interventions Discussed/Reviewed General Interventions Discussed, General Interventions Reviewed, Annual Eye Exam, Labs, Durable Medical Equipment (DME), Vaccines, Health Screening, [employer], Doctor Visits, Communication with  [employer] Care Provider]  Labs Hgb A1c annually  Vaccines COVID-19, Flu, Pneumonia, RSV, Shingles  [Encouraged]  Doctor Visits Discussed/Reviewed Doctor Visits Discussed, Doctor Visits Reviewed, Annual Wellness Visits, PCP, Specialist  Health Screening Colonoscopy, Mammogram  [Encouraged]  PCP/Specialist Visits Compliance with follow-up visit  Communication with PCP/Specialists  Exercise Interventions   Exercise Discussed/Reviewed Exercise Discussed, Exercise Reviewed, Physical Activity, Weight Managment, Assistive device use and maintanence  Physical Activity Discussed/Reviewed Physical Activity Discussed, Physical Activity Reviewed, Types of exercise  Weight Management Weight loss  Education Interventions   Education Provided Provided Printed Education, Provided Education  Provided Verbal Education On Nutrition, Eye Care, Labs, Mental Health/Coping with Illness, Exercise, Medication, When to see the doctor, [place] Discussed/Reviewed Mental Health Discussed, Mental Health Reviewed, Coping Strategies, Crisis, Anxiety, Depression  Nutrition Interventions   Nutrition Discussed/Reviewed Nutrition Discussed, Nutrition Reviewed, Portion sizes, Decreasing sugar intake, Decreasing fats, Decreasing salt  Pharmacy Interventions   Pharmacy Dicussed/Reviewed Pharmacy Topics Discussed, Pharmacy Topics Reviewed, Medication Adherence, Affording Medications  Safety Interventions   Safety Discussed/Reviewed Safety Discussed, Safety Reviewed  Advanced Directive Interventions   Advanced Directives Discussed/Reviewed Advanced Directives Discussed, Advanced Directives Reviewed     Active Listening & Reflection Utilized.  Verbalization of Feelings Encouraged.  Emotional Support Provided. Caregiver Stress Acknowledged. Caregiver Resources Reviewed. Caregiver Support Groups Discussed. Self-Enrollment in Caregiver Support Group of Interest Emphasized. Problem-Solving Interventions Activated. Task-Centered Activities Enacted. Solution-Focused Strategies Employed.   Cognitive Behavioral Therapy Performed. Client-Centered Solutions Implemented. Acceptance & Commitment Therapy Initiated. Deep Breathing Exercises, Relaxation Techniques & Mindfulness Meditation Strategies Encouraged Daily. Confirmed Receipt & Thoroughly Reviewed, "12 Ways to Deal With a Disrespectful Grown Child": ~ Take a Deep Breath Before Responding. ~ Remain Respectful. ~ Set Realistic Boundaries. ~ Acknowledge Your Mistakes. ~ Be Open to Listening & Empathizing. ~ Focus on The Present. ~ Examine Your Parenting Style. ~ Set Realistic Expectations. ~ Be a [place]. ~ Know Your Value. ~ Be Consistent. ~ Examine If Behavior Is a Cry For Help. Please Contact CSW Directly (# [phone]), if You Have Questions, Need Assistance, or If Additional Social Work Needs Are Identified Between Now & Our Next  Scheduled Follow-Up Outreach Call.      SDOH assessments and interventions completed:  Yes.  Care Coordination Interventions:  Yes, provided.   Follow up plan: Follow up call scheduled for 09/24/2022 at 9:30 am.  Encounter Outcome:  Pt. Visit Completed.   [name], [place], MSW, [employer]  Licensed  Clinical Social Worker  [facility]  Mailing [place] [address] [place], [place] [address] Physical Address-[address], [place], [place] [address] Toll Free Main # [phone] Fax # [phone] Cell # [phone] Di [email or web address]

## 2022-09-10 NOTE — Patient Instructions (Signed)
Visit Information  Thank you for taking time to visit with me today. Please don't hesitate to contact me if I can be of assistance to you.   Following are the goals we discussed today:   Goals Addressed               This Visit's Progress     Reduce and Manage Symptoms of Anxiety and Depression. (pt-stated)   On track     Care Coordination Interventions:  Interventions Today    Flowsheet Row Most Recent Value  Chronic Disease   Chronic disease during today's visit Other  [Anxiety, Depression & Insomnia]  General Interventions   General Interventions Discussed/Reviewed General Interventions Discussed, General Interventions Reviewed, Annual Eye Exam, Labs, Durable Medical Equipment (DME), Vaccines, Health Screening, [employer], Doctor Visits, Communication with  [employer] Care Provider]  Labs Hgb A1c annually  Vaccines COVID-19, Flu, Pneumonia, RSV, Shingles  [Encouraged]  Doctor Visits Discussed/Reviewed Doctor Visits Discussed, Doctor Visits Reviewed, Annual Wellness Visits, PCP, Specialist  Health Screening Colonoscopy, Mammogram  [Encouraged]  PCP/Specialist Visits Compliance with follow-up visit  Communication with PCP/Specialists  Exercise Interventions   Exercise Discussed/Reviewed Exercise Discussed, Exercise Reviewed, Physical Activity, Weight Managment, Assistive device use and maintanence  Physical Activity Discussed/Reviewed Physical Activity Discussed, Physical Activity Reviewed, Types of exercise  Weight Management Weight loss  Education Interventions   Education Provided Provided Printed Education, Provided Education  Provided Verbal Education On Nutrition, Eye Care, Labs, Mental Health/Coping with Illness, Exercise, Medication, When to see the doctor, [place] Discussed/Reviewed Mental Health Discussed, Mental Health Reviewed, Coping Strategies, Crisis, Anxiety, Depression  Nutrition Interventions    Nutrition Discussed/Reviewed Nutrition Discussed, Nutrition Reviewed, Portion sizes, Decreasing sugar intake, Decreasing fats, Decreasing salt  Pharmacy Interventions   Pharmacy Dicussed/Reviewed Pharmacy Topics Discussed, Pharmacy Topics Reviewed, Medication Adherence, Affording Medications  Safety Interventions   Safety Discussed/Reviewed Safety Discussed, Safety Reviewed  Advanced Directive Interventions   Advanced Directives Discussed/Reviewed Advanced Directives Discussed, Advanced Directives Reviewed     Active Listening & Reflection Utilized.  Verbalization of Feelings Encouraged.  Emotional Support Provided. Caregiver Stress Acknowledged. Caregiver Resources Reviewed. Caregiver Support Groups Discussed. Self-Enrollment in Caregiver Support Group of Interest Emphasized. Problem-Solving Interventions Activated. Task-Centered Activities Enacted. Solution-Focused Strategies Employed.   Cognitive Behavioral Therapy Performed. Client-Centered Solutions Implemented. Acceptance & Commitment Therapy Initiated. Deep Breathing Exercises, Relaxation Techniques & Mindfulness Meditation Strategies Encouraged Daily. Confirmed Receipt & Thoroughly Reviewed, "12 Ways to Deal With a Disrespectful Grown Child": ~ Take a Deep Breath Before Responding. ~ Remain Respectful. ~ Set Realistic Boundaries. ~ Acknowledge Your Mistakes. ~ Be Open to Listening & Empathizing. ~ Focus on The Present. ~ Examine Your Parenting Style. ~ Set Realistic Expectations. ~ Be a [place] Front. ~ Know Your Value. ~ Be Consistent. ~ Examine If Behavior Is a Cry For Help. Please Contact CSW Directly (# [phone]), if You Have Questions, Need Assistance, or If Additional Social Work Needs Are Identified Between Now & Our Next Scheduled Follow-Up Outreach Call.      Our next appointment is by telephone on 09/24/2022 at 9:30 am.  Please call the care guide team at [phone] if you need to cancel or  reschedule your appointment.   If you are experiencing a Mental Health or [place] or need someone to talk to, please call the Suicide and Crisis Lifeline: 988 call the [place] National Suicide Prevention Lifeline: 404-354-0440 or TTY: 782-115-4219 TTY 3804609569) to talk to a trained counselor  call 1-800-273-TALK (toll free, 24 hour hotline) go to [facility] Urgent [facility] [address] [place] [id number]) call the [place]: [phone] call 911  Patient verbalizes understanding of instructions and care plan provided today and agrees to view in [place]. Active MyChart status and patient understanding of how to access instructions and care plan via MyChart confirmed with patient.     Telephone follow up appointment with care management team member scheduled for:  09/24/2022 at 9:30 am.  [name], BSW, MSW, [place]  Licensed Clinical Social Worker  [place]  Mailing [place]. [address], [place], [place] [address] Physical Address[address] [place], [place] [address] Toll Free Main # [phone] Fax # [phone] Cell # [phone] [email or web address]

## 2022-09-12 ENCOUNTER — Ambulatory Visit: Payer: 59 | Admitting: Family Medicine

## 2022-09-17 ENCOUNTER — Telehealth: Payer: Self-pay | Admitting: Gastroenterology

## 2022-09-17 ENCOUNTER — Encounter: Payer: Self-pay | Admitting: Gastroenterology

## 2022-09-17 NOTE — Telephone Encounter (Signed)
PT needs a new ambulatory  referral sent. She rescheduled procedure for [facility]

## 2022-09-24 ENCOUNTER — Ambulatory Visit: Payer: Self-pay | Admitting: *Deleted

## 2022-09-24 ENCOUNTER — Encounter: Payer: Self-pay | Admitting: *Deleted

## 2022-09-24 NOTE — Patient Instructions (Signed)
Visit Information  Thank you for taking time to visit with me today. Please don't hesitate to contact me if I can be of assistance to you.   Following are the goals we discussed today:   Goals Addressed               This Visit's Progress     Reduce and Manage Symptoms of Anxiety and Depression. (pt-stated)   On track     Care Coordination Interventions:  Interventions Today    Flowsheet Row Most Recent Value  Chronic Disease   Chronic disease during today's visit Other  [Anxiety, Depression & Insomnia]  General Interventions   General Interventions Discussed/Reviewed General Interventions Discussed, General Interventions Reviewed, Annual Eye Exam, Labs, Durable Medical Equipment (DME), Vaccines, Health Screening, [employer], Doctor Visits, Communication with  [employer] [facility] Care Provider]  Labs Hgb A1c annually  Vaccines COVID-19, Flu, Pneumonia, RSV, Shingles  [Encouraged]  Doctor Visits Discussed/Reviewed Doctor Visits Discussed, Doctor Visits Reviewed, Annual Wellness Visits, PCP, Specialist  Health Screening Colonoscopy, Mammogram  [Encouraged]  PCP/Specialist Visits Compliance with follow-up visit  Communication with PCP/Specialists  Exercise Interventions   Exercise Discussed/Reviewed Exercise Discussed, Exercise Reviewed, Physical Activity, Weight Managment, Assistive device use and maintanence  Physical Activity Discussed/Reviewed Physical Activity Discussed, Physical Activity Reviewed, Types of exercise  Weight Management Weight loss  Education Interventions   Education Provided Provided Printed Education, Provided Education  Provided Verbal Education On Nutrition, Eye Care, Labs, Mental Health/Coping with Illness, Exercise, Medication, When to see the doctor, Community Resources  Mental Health Interventions   Mental Health Discussed/Reviewed Mental Health Discussed, Mental Health Reviewed, Coping Strategies, Crisis, Anxiety, Depression  Nutrition Interventions    Nutrition Discussed/Reviewed Nutrition Discussed, Nutrition Reviewed, Portion sizes, Decreasing sugar intake, Decreasing fats, Decreasing salt  Pharmacy Interventions   Pharmacy Dicussed/Reviewed Pharmacy Topics Discussed, Pharmacy Topics Reviewed, Medication Adherence, Affording Medications  Safety Interventions   Safety Discussed/Reviewed Safety Discussed, Safety Reviewed  Advanced Directive Interventions   Advanced Directives Discussed/Reviewed Advanced Directives Discussed, Advanced Directives Reviewed     Active Listening & Reflection Utilized.  Verbalization of Feelings Encouraged.  Emotional Support Provided. Caregiver Stress Acknowledged. Caregiver Resources Reviewed. Caregiver Support Groups Discussed. Self-Enrollment in Caregiver Support Group of Interest Emphasized. Problem-Solving Interventions Activated. Task-Centered Activities Indicated. Solution-Focused Strategies Employed.   Cognitive Behavioral Therapy Initiated. Client-Centered Solutions Implemented. Acceptance & Commitment Therapy Performed. Continued Review of "12 Ways to Deal With a Disrespectful Grown Child", to Ensure Understanding: ~ Take a Deep Breath Before Responding. ~ Remain Respectful. ~ Set Realistic Boundaries. ~ Acknowledge Your Mistakes. ~ Be Open to Listening & Empathizing. ~ Focus on The Present. ~ Examine Your Parenting Style. ~ Set Realistic Expectations. ~ Be a [place] Front. ~ Know Your Value. ~ Be Consistent. ~ Examine If Behavior Is a Cry For Help. Continue to Implement Activities of Interest & Interventions That Appear to Be Effective, in An Effort to Obtain & Achieve Desired Outcomes & Behaviors. Please Contact CSW Directly (# [phone]), if You Have Questions, Need Assistance, or If Additional Social Work Needs Are Identified Between Now & Our Next Scheduled Follow-Up Outreach Call.      Our next appointment is by telephone on 10/08/2022 at 9:45 am.   Please call the care  guide team at [phone] if you need to cancel or reschedule your appointment.   If you are experiencing a Mental Health or Behavioral Health Crisis or need someone to talk to, please call the Suicide and Crisis Lifeline: 988 call the [place] National  Suicide Prevention Lifeline: (570)562-0961 or TTY: 941-430-2005 TTY (475)017-9186) to talk to a trained counselor call 1-800-273-TALK (toll free, 24 hour hotline) go to [facility]gent Care [address] [place] [phone]) call the [facility] Crisis Line: [phone] call 911  Patient verbalizes understanding of instructions and care plan provided today and agrees to view in MyChart. Active MyChart status and patient understanding of how to access instructions and care plan via MyChart confirmed with patient.     Telephone follow up appointment with care management team member scheduled for:  10/08/2022 at 9:45 am.   [name], BSW, MSW, LCSW  Licensed Clinical Social Worker  [facility]  Mailing [place]. [address], [place], [place] [address] Physical Address-[address] [place], [place] [address] Toll Free Main # [phone] Fax # [phone] Cell # [phone] [email or web address]

## 2022-09-24 NOTE — Patient Outreach (Signed)
Care Coordination   Follow Up Visit Note   09/24/2022  Name: Adriana [name] MRN: [id number] DOB: 1960-08-01  Adriana [name] is a 62 y.o. year old female who sees Gilmore Laroche, FNP for primary care. I spoke with Adriana [name] by phone today.  What matters to the patients health and wellness today?  Reduce and Manage Symptoms of Anxiety and Depression.   Goals Addressed               This Visit's Progress     Reduce and Manage Symptoms of Anxiety and Depression. (pt-stated)   On track     Care Coordination Interventions:  Interventions Today    Flowsheet Row Most Recent Value  Chronic Disease   Chronic disease during today's visit Other  [Anxiety, Depression & Insomnia]  General Interventions   General Interventions Discussed/Reviewed General Interventions Discussed, General Interventions Reviewed, Annual Eye Exam, Labs, Durable Medical Equipment (DME), Vaccines, Health Screening, [employer], Doctor Visits, Communication with  [employer] [facility] Care Provider]  Labs Hgb A1c annually  Vaccines COVID-19, Flu, Pneumonia, RSV, Shingles  [Encouraged]  Doctor Visits Discussed/Reviewed Doctor Visits Discussed, Doctor Visits Reviewed, Annual Wellness Visits, PCP, Specialist  Health Screening Colonoscopy, Mammogram  [Encouraged]  PCP/Specialist Visits Compliance with follow-up visit  Communication with PCP/Specialists  Exercise Interventions   Exercise Discussed/Reviewed Exercise Discussed, Exercise Reviewed, Physical Activity, Weight Managment, Assistive device use and maintanence  Physical Activity Discussed/Reviewed Physical Activity Discussed, Physical Activity Reviewed, Types of exercise  Weight Management Weight loss  Education Interventions   Education Provided Provided Printed Education, Provided Education  Provided Verbal Education On Nutrition, Eye Care, Labs, Mental Health/Coping with Illness, Exercise, Medication, When to see the doctor, Community Resources   Mental Health Interventions   Mental Health Discussed/Reviewed Mental Health Discussed, Mental Health Reviewed, Coping Strategies, Crisis, Anxiety, Depression  Nutrition Interventions   Nutrition Discussed/Reviewed Nutrition Discussed, Nutrition Reviewed, Portion sizes, Decreasing sugar intake, Decreasing fats, Decreasing salt  Pharmacy Interventions   Pharmacy Dicussed/Reviewed Pharmacy Topics Discussed, Pharmacy Topics Reviewed, Medication Adherence, Affording Medications  Safety Interventions   Safety Discussed/Reviewed Safety Discussed, Safety Reviewed  Advanced Directive Interventions   Advanced Directives Discussed/Reviewed Advanced Directives Discussed, Advanced Directives Reviewed     Active Listening & Reflection Utilized.  Verbalization of Feelings Encouraged.  Emotional Support Provided. Caregiver Stress Acknowledged. Caregiver Resources Reviewed. Caregiver Support Groups Discussed. Self-Enrollment in Caregiver Support Group of Interest Emphasized. Problem-Solving Interventions Activated. Task-Centered Activities Indicated. Solution-Focused Strategies Employed.   Cognitive Behavioral Therapy Initiated. Client-Centered Solutions Implemented. Acceptance & Commitment Therapy Performed. Continued Review of "12 Ways to Deal With a Disrespectful Grown Child", to Ensure Understanding: ~ Take a Deep Breath Before Responding. ~ Remain Respectful. ~ Set Realistic Boundaries. ~ Acknowledge Your Mistakes. ~ Be Open to Listening & Empathizing. ~ Focus on The Present. ~ Examine Your Parenting Style. ~ Set Realistic Expectations. ~ Be a [place] Front. ~ Know Your Value. ~ Be Consistent. ~ Examine If Behavior Is a Cry For Help. Continue to Implement Activities of Interest & Interventions That Appear to Be Effective, in An Effort to Obtain & Achieve Desired Outcomes & Behaviors. Please Contact CSW Directly (# [phone]), if You Have Questions, Need Assistance, or If Additional  Social Work Needs Are Identified Between Now & Our Next Scheduled Follow-Up Outreach Call.      SDOH assessments and interventions completed:  Yes.  Care Coordination Interventions:  Yes, provided.   Follow up plan: Follow up call scheduled for 10/08/2022 at 9:45 am.  Encounter Outcome:  Pt. Visit Completed.   [name], BSW, MSW, LCSW  Licensed Education officer, [facility]  Mailing [employer] [address], [place], [place] [address] Physical Address-[address], [place], [place] [address] Toll Free Main # [phone] Fax # [phone] Cell # [phone] [email or web address]

## 2022-09-26 ENCOUNTER — Encounter: Payer: 59 | Admitting: Gastroenterology

## 2022-10-08 ENCOUNTER — Encounter: Payer: Self-pay | Admitting: *Deleted

## 2022-10-08 ENCOUNTER — Ambulatory Visit: Payer: Self-pay | Admitting: *Deleted
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
## 2022-10-22 ENCOUNTER — Encounter: Payer: Self-pay | Admitting: *Deleted

## 2022-10-22 ENCOUNTER — Ambulatory Visit: Payer: Self-pay | Admitting: *Deleted

## 2022-10-22 NOTE — Patient Outreach (Signed)
Care Coordination   Follow Up Visit Note   10/22/2022  Name: Adriana [name] MRN: [id number] DOB: Jan 15, 1961  Adriana [name] is a 62 y.o. year old female who sees Gilmore Laroche, FNP for primary care. I spoke with Adaliz G Gunawan by phone today.  What matters to the patients health and wellness today?  Reduce and Manage Symptoms of Anxiety and Depression.   Goals Addressed               This Visit's Progress     Reduce and Manage Symptoms of Anxiety and Depression. (pt-stated)   On track     Care Coordination Interventions:  Interventions Today    Flowsheet Row Most Recent Value  Chronic Disease   Chronic disease during today's visit Other  [Anxiety, Depression & Insomnia]  General Interventions   General Interventions Discussed/Reviewed General Interventions Discussed, General Interventions Reviewed, Annual Eye Exam, Labs, Durable Medical Equipment (DME), Vaccines, Health Screening, [facility], Doctor Visits, Communication with  [facility] Care Provider]  Labs Hgb A1c annually  Vaccines COVID-19, Flu, Pneumonia, RSV, Shingles  [Encouraged]  Doctor Visits Discussed/Reviewed Doctor Visits Discussed, Doctor Visits Reviewed, Annual Wellness Visits, PCP, Specialist  Health Screening Colonoscopy, Mammogram  [Encouraged]  PCP/Specialist Visits Compliance with follow-up visit  Communication with PCP/Specialists  Exercise Interventions   Exercise Discussed/Reviewed Exercise Discussed, Exercise Reviewed, Physical Activity, Weight Managment, Assistive device use and maintanence  Physical Activity Discussed/Reviewed Physical Activity Discussed, Physical Activity Reviewed, Types of exercise  Weight Management Weight loss  Education Interventions   Education Provided Provided Printed Education, Provided Education  Provided Verbal Education On Nutrition, Eye Care, Labs, Mental Health/Coping with Illness, Exercise, Medication, When to see the doctor, Community Resources   Mental Health Interventions   Mental Health Discussed/Reviewed Mental Health Discussed, Mental Health Reviewed, Coping Strategies, Crisis, Anxiety, Depression  Nutrition Interventions   Nutrition Discussed/Reviewed Nutrition Discussed, Nutrition Reviewed, Portion sizes, Decreasing sugar intake, Decreasing fats, Decreasing salt  Pharmacy Interventions   Pharmacy Dicussed/Reviewed Pharmacy Topics Discussed, Pharmacy Topics Reviewed, Medication Adherence, Affording Medications  Safety Interventions   Safety Discussed/Reviewed Safety Discussed, Safety Reviewed  Advanced Directive Interventions   Advanced Directives Discussed/Reviewed Advanced Directives Discussed, Advanced Directives Reviewed     Active Listening & Reflection Utilized.  Verbalization of Feelings Encouraged.  Emotional Support Provided. Symptoms of Grief Acknowledged. Feelings of Sadness Validated.   Grief & Loss Support Groups Discussed. Self-Enrollment in Grief & Loss Support Group of Interest Emphasized. Problem-Solving Interventions Activated. Task-Centered Activities Implemented. Solution-Focused Strategies Employed.   Cognitive Behavioral Therapy Indicated. Client-Centered Solutions Performed. Acceptance & Commitment Therapy Initiated. Please Review Educational Material on "Coping with Grief", Emailed on 10/22/2022:          ~ Acknowledge Your Grief & Give Yourself Permission to Express It.  ~ Allow Yourself to Cry. ~ Suppressing Your Feelings of Sadness Can Prolong Your Grief Process. ~ Focus on The Life You Shared with Your Dog & Some of Your Favorite      Memories with Him. ~ Reach Out to Others Who Can Lend a Sympathetic Ear.  ~ Memorialize Your Dog through a Bereavement Ritual.           ~ Practice Your Own Culturally Significant Expression of Grief.          ~ Write About Your Feelings or Write a Letter to Your Dog About All the                 Things You Would Like to Say  to Him. ~ Write an Ileene Hutchinson for  Your Dog. ~ Share Photos & Memories of Your Dog Via Social Media. Please Consider Utilizing the Following List of Grief & Loss Support Programs for Loss of Pet, Emailed on 10/22/2022: ~ The Pet Compassion Careline, Provides 24/7 Grief Support with Trained Pet     Grief Counselors (# [phone]). ~ [employer], Provides Grief Courses & 50-Minute One-on-One Support     Sessions with A Grief Counselor [phone]). ~ [employer] (# (317)606-1991). ~ Association for Pet Loss & Bereavement Support Groups, Available at     Specific Times Throughout the Week (# Z3807416). Please Contact CSW Directly (# [phone]), if You Have Questions, Need Assistance, or If Additional Social Work Needs Are Identified Between Now & Our Next Scheduled Follow-Up Outreach Call.      SDOH assessments and interventions completed:  Yes.  Care Coordination Interventions:  Yes, provided.   Follow up plan: Follow up call scheduled for 11/05/2022 at 10:30 am.   Encounter Outcome:  Pt. Visit Completed.   [name], BSW, MSW, LCSW  Licensed Restaurant manager, [employer] [facility]  Mailing [place] [address], [place], [place] [address] Physical Address-[address], [place], [place] [address] Toll Free Main # [phone] Fax # [phone] Cell # [phone] [email or web address]

## 2022-10-22 NOTE — Patient Instructions (Signed)
Visit Information  Thank you for taking time to visit with me today. Please don't hesitate to contact me if I can be of assistance to you.   Following are the goals we discussed today:   Goals Addressed               This Visit's Progress     Reduce and Manage Symptoms of Anxiety and Depression. (pt-stated)   On track     Care Coordination Interventions:  Interventions Today    Flowsheet Row Most Recent Value  Chronic Disease   Chronic disease during today's visit Other  [Anxiety, Depression & Insomnia]  General Interventions   General Interventions Discussed/Reviewed General Interventions Discussed, General Interventions Reviewed, Annual Eye Exam, Labs, Durable Medical Equipment (DME), Vaccines, Health Screening, [facility], Doctor Visits, Communication with  [employer] Care Provider]  Labs Hgb A1c annually  Vaccines COVID-19, Flu, Pneumonia, RSV, Shingles  [Encouraged]  Doctor Visits Discussed/Reviewed Doctor Visits Discussed, Doctor Visits Reviewed, Annual Wellness Visits, PCP, Specialist  Health Screening Colonoscopy, Mammogram  [Encouraged]  PCP/Specialist Visits Compliance with follow-up visit  Communication with PCP/Specialists  Exercise Interventions   Exercise Discussed/Reviewed Exercise Discussed, Exercise Reviewed, Physical Activity, Weight Managment, Assistive device use and maintanence  Physical Activity Discussed/Reviewed Physical Activity Discussed, Physical Activity Reviewed, Types of exercise  Weight Management Weight loss  Education Interventions   Education Provided Provided Printed Education, Provided Education  Provided Verbal Education On Nutrition, Eye Care, Labs, Mental Health/Coping with Illness, Exercise, Medication, When to see the doctor, Community Resources  Mental Health Interventions   Mental Health Discussed/Reviewed Mental Health Discussed, Mental Health Reviewed, Coping Strategies, Crisis, Anxiety, Depression  Nutrition Interventions    Nutrition Discussed/Reviewed Nutrition Discussed, Nutrition Reviewed, Portion sizes, Decreasing sugar intake, Decreasing fats, Decreasing salt  Pharmacy Interventions   Pharmacy Dicussed/Reviewed Pharmacy Topics Discussed, Pharmacy Topics Reviewed, Medication Adherence, Affording Medications  Safety Interventions   Safety Discussed/Reviewed Safety Discussed, Safety Reviewed  Advanced Directive Interventions   Advanced Directives Discussed/Reviewed Advanced Directives Discussed, Advanced Directives Reviewed     Active Listening & Reflection Utilized.  Verbalization of Feelings Encouraged.  Emotional Support Provided. Symptoms of Grief Acknowledged. Feelings of Sadness Validated.   Grief & Loss Support Groups Discussed. Self-Enrollment in Grief & Loss Support Group of Interest Emphasized. Problem-Solving Interventions Activated. Task-Centered Activities Implemented. Solution-Focused Strategies Employed.   Cognitive Behavioral Therapy Indicated. Client-Centered Solutions Performed. Acceptance & Commitment Therapy Initiated. Please Review Educational Material on "Coping with Grief", Emailed on 10/22/2022:          ~ Acknowledge Your Grief & Give Yourself Permission to Express It.  ~ Allow Yourself to Cry. ~ Suppressing Your Feelings of Sadness Can Prolong Your Grief Process. ~ Focus on The Life You Shared with Your Dog & Some of Your Favorite     Memories with Him. ~ Reach Out to Others Who Can Lend a Sympathetic Ear.  ~ Memorialize Your Dog through a Bereavement Ritual.           ~ Practice Your Own Culturally Significant Expression of Grief.          ~ Write About Your Feelings or Write a Letter to Your Dog About All the                 Things You Would Like to Say to Him. ~ Write an Ileene Hutchinson for Your Dog. ~ Share Photos & Memories of Your Dog Via Social Media. Please Consider Utilizing the Following List of Grief & Loss Support Programs  for Loss of Pet, Emailed on 10/22/2022: ~  The Pet Compassion Careline, Provides 24/7 Grief Support with Trained Pet     Grief Counselors (# ([phone]). ~ [employer], Provides Grief Courses & 50-Minute One-on-One Support      Sessions with A Grief Counselor [phone]). ~ [employer] (# 7203709763). ~ Association for Pet Loss & Bereavement Support Groups, Available at     Specific Times Throughout the Week (# Z3807416). Please Contact CSW Directly (# [phone]), if You Have Questions, Need Assistance, or If Additional Social Work Needs Are Identified Between Now & Our Next Scheduled Follow-Up Outreach Call.      Our next appointment is by telephone on 11/05/2022 at 10:30 am.  Please call the care guide team at [phone] if you need to cancel or reschedule your appointment.   If you are experiencing a Mental Health or Behavioral Health Crisis or need someone to talk to, please call the Suicide and Crisis Lifeline: 988 call the [place] National Suicide Prevention Lifeline: (331)613-2571 or TTY: (413)342-1476 TTY (225)453-1409) to talk to a trained counselor call 1-800-273-TALK (toll free, 24 hour hotline) go to [facility] [address] [place] [phone]) call the [facility] Crisis Line: [phone] call 911  Patient verbalizes understanding of instructions and care plan provided today and agrees to view in MyChart. Active MyChart status and patient understanding of how to access instructions and care plan via MyChart confirmed with patient.     Telephone follow up appointment with care management team member scheduled for:  11/05/2022 at 10:30 am.  [name], BSW, MSW, LCSW  Licensed Clinical Social Worker  [facility]  Mailing [place]. [address], [place], [place] [address] Physical Address-[address] [place], [place] [address] Toll Free Main # [phone] Fax # [phone] Cell #  [phone] [email or web address]

## 2022-11-05 ENCOUNTER — Encounter: Payer: Self-pay | Admitting: *Deleted

## 2022-11-05 ENCOUNTER — Ambulatory Visit: Payer: Self-pay | Admitting: *Deleted

## 2022-11-05 NOTE — Patient Instructions (Signed)
Visit Information  Thank you for taking time to visit with me today. Please don't hesitate to contact me if I can be of assistance to you.   Following are the goals we discussed today:   Goals Addressed               This Visit's Progress     Reduce and Manage Symptoms of Anxiety and Depression. (pt-stated)   On track     Care Coordination Interventions:  Interventions Today    Flowsheet Row Most Recent Value  Chronic Disease   Chronic disease during today's visit Other  [Anxiety, Depression & Insomnia]  General Interventions   General Interventions Discussed/Reviewed General Interventions Discussed, General Interventions Reviewed, Annual Eye Exam, Labs, Durable Medical Equipment (DME), Vaccines, Health Screening, [facility], Doctor Visits, Communication with  [employer] [facility] Care Provider]  Labs Hgb A1c annually  Vaccines COVID-19, Flu, Pneumonia, RSV, Shingles  [Encouraged]  Doctor Visits Discussed/Reviewed Doctor Visits Discussed, Doctor Visits Reviewed, Annual Wellness Visits, PCP, Specialist  Health Screening Colonoscopy, Mammogram  [Encouraged]  PCP/Specialist Visits Compliance with follow-up visit  Communication with PCP/Specialists  Exercise Interventions   Exercise Discussed/Reviewed Exercise Discussed, Exercise Reviewed, Physical Activity, Weight Managment, Assistive device use and maintanence  Physical Activity Discussed/Reviewed Physical Activity Discussed, Physical Activity Reviewed, Types of exercise  Weight Management Weight loss  Education Interventions   Education Provided Provided Printed Education, Provided Education  Provided Verbal Education On Nutrition, Eye Care, Labs, Mental Health/Coping with Illness, Exercise, Medication, When to see the doctor, Community Resources  Mental Health Interventions   Mental Health Discussed/Reviewed Mental Health Discussed, Mental Health Reviewed, Coping Strategies, Crisis, Anxiety, Depression  Nutrition Interventions    Nutrition Discussed/Reviewed Nutrition Discussed, Nutrition Reviewed, Portion sizes, Decreasing sugar intake, Decreasing fats, Decreasing salt  Pharmacy Interventions   Pharmacy Dicussed/Reviewed Pharmacy Topics Discussed, Pharmacy Topics Reviewed, Medication Adherence, Affording Medications  Safety Interventions   Safety Discussed/Reviewed Safety Discussed, Safety Reviewed  Advanced Directive Interventions   Advanced Directives Discussed/Reviewed Advanced Directives Discussed, Advanced Directives Reviewed     Active Listening & Reflection Utilized.  Verbalization of Feelings Encouraged.  Emotional Support Provided. Symptoms of Grief Acknowledged. Feelings of Sadness Validated.   Grief & Loss Support Groups Discussed. Self-Enrollment in Grief & Loss Support Group of Interest Emphasized. Problem-Solving Interventions Activated. Task-Centered Activities Indicated. Solution-Focused Strategies Employed.   Cognitive Behavioral Therapy Initiated. Client-Centered Solutions Performed. Acceptance & Commitment Therapy Implemented. Confirmed Receipt & Thoroughly Reviewed Educational Material on "Coping with Grief", to Ensure Understanding & Entertain Questions:          ~ Acknowledge Your Grief & Give Yourself Permission to Express It.  ~ Allow Yourself to Cry. ~ Suppressing Your Feelings of Sadness Can Prolong Your Grief Process. ~ Focus on The Life You Shared with Your Dog & Some of Your Favorite     Memories with Him. ~ Reach Out to Others Who Can Lend a Sympathetic Ear.  ~ Memorialize Your Dog through a Bereavement Ritual.           ~ Practice Your Own Culturally Significant Expression of Grief.          ~ Write About Your Feelings or Write a Letter to Your Dog About All the                 Things You Would Like to Say to Him. ~ Write an Ileene Hutchinson for Your Dog. ~ Share Photos & Memories of Your Dog Via Social Media. Continue to Consider Utilizing the Following  List of Grief & Loss  Support Programs for Loss of Pet: ~ The Pet Compassion Careline, Provides 24/7 Grief Support with Trained Pet     Grief Counselors (# 9058740737). ~ [employer], Provides Grief Courses & 50-Minute One-on-One Support     Sessions with A Grief Counselor [phone]). ~ [employer] (# 8640274983). ~ Association for Pet Loss & Bereavement Support Groups, Available at     Specific Times Throughout the Week (# Z3807416). Please Contact CSW Directly (# [phone]), if You Have Questions, Need Assistance, or If Additional Social Work Needs Are Identified Between Now & Our Next Scheduled Follow-Up Outreach Call.      Our next appointment is by telephone on 11/19/2022 at 10:30 am.  Please call the care guide team at [phone] if you need to cancel or reschedule your appointment.   If you are experiencing a Mental Health or Behavioral Health Crisis or need someone to talk to, please call the Suicide and Crisis Lifeline: 988 call the [place] National Suicide Prevention Lifeline: 402-632-0604 or TTY: 763-434-6501 TTY (269) 683-0452) to talk to a trained counselor call 1-800-273-TALK (toll free, 24 hour hotline) go to [facility] [address] [place] [phone]) call the [facility] Crisis Line: [phone] call 911  Patient verbalizes understanding of instructions and care plan provided today and agrees to view in MyChart. Active MyChart status and patient understanding of how to access instructions and care plan via MyChart confirmed with patient.     Telephone follow up appointment with care management team member scheduled for:  11/19/2022 at 10:30 am.  [name], BSW, MSW, LCSW  Licensed Clinical Social Worker  [facility]  Mailing [place]. [address] [place], [place] [address] Physical Address[address], [place], [place] [address] Toll Free Main #  [phone] Fax # [phone] Cell # [phone] [email or web address]

## 2022-11-05 NOTE — Patient Outreach (Signed)
Care Coordination   Follow Up Visit Note   11/05/2022  Name: Adriana [name] MRN: [id number] DOB: [birth year]  Adriana [name] is a 62 y.o. year old female who sees Gilmore Laroche, FNP for primary care. I spoke with Adriana [name] by phone today.  What matters to the patients health and wellness today?  Reduce and Manage Symptoms of Anxiety and Depression.   Goals Addressed               This Visit's Progress     Reduce and Manage Symptoms of Anxiety and Depression. (pt-stated)   On track     Care Coordination Interventions:  Interventions Today    Flowsheet Row Most Recent Value  Chronic Disease   Chronic disease during today's visit Other  [Anxiety, Depression & Insomnia]  General Interventions   General Interventions Discussed/Reviewed General Interventions Discussed, General Interventions Reviewed, Annual Eye Exam, Labs, Durable Medical Equipment (DME), Vaccines, Health Screening, [facility], Doctor Visits, Communication with  [employer] [facility] Care Provider]  Labs Hgb A1c annually  Vaccines COVID-19, Flu, Pneumonia, RSV, Shingles  [Encouraged]  Doctor Visits Discussed/Reviewed Doctor Visits Discussed, Doctor Visits Reviewed, Annual Wellness Visits, PCP, Specialist  Health Screening Colonoscopy, Mammogram  [Encouraged]  PCP/Specialist Visits Compliance with follow-up visit  Communication with PCP/Specialists  Exercise Interventions   Exercise Discussed/Reviewed Exercise Discussed, Exercise Reviewed, Physical Activity, Weight Managment, Assistive device use and maintanence  Physical Activity Discussed/Reviewed Physical Activity Discussed, Physical Activity Reviewed, Types of exercise  Weight Management Weight loss  Education Interventions   Education Provided Provided Printed Education, Provided Education  Provided Verbal Education On Nutrition, Eye Care, Labs, Mental Health/Coping with Illness, Exercise, Medication, When to see the doctor, Community Resources   Mental Health Interventions   Mental Health Discussed/Reviewed Mental Health Discussed, Mental Health Reviewed, Coping Strategies, Crisis, Anxiety, Depression  Nutrition Interventions   Nutrition Discussed/Reviewed Nutrition Discussed, Nutrition Reviewed, Portion sizes, Decreasing sugar intake, Decreasing fats, Decreasing salt  Pharmacy Interventions   Pharmacy Dicussed/Reviewed Pharmacy Topics Discussed, Pharmacy Topics Reviewed, Medication Adherence, Affording Medications  Safety Interventions   Safety Discussed/Reviewed Safety Discussed, Safety Reviewed  Advanced Directive Interventions   Advanced Directives Discussed/Reviewed Advanced Directives Discussed, Advanced Directives Reviewed     Active Listening & Reflection Utilized.  Verbalization of Feelings Encouraged.  Emotional Support Provided. Symptoms of Grief Acknowledged. Feelings of Sadness Validated.   Grief & Loss Support Groups Discussed. Self-Enrollment in Grief & Loss Support Group of Interest Emphasized. Problem-Solving Interventions Activated. Task-Centered Activities Indicated. Solution-Focused Strategies Employed.   Cognitive Behavioral Therapy Initiated. Client-Centered Solutions Performed. Acceptance & Commitment Therapy Implemented. Confirmed Receipt & Thoroughly Reviewed Educational Material on "Coping with Grief", to Ensure Understanding & Entertain Questions:          ~ Acknowledge Your Grief & Give Yourself Permission to Express It.  ~ Allow Yourself to Cry. ~ Suppressing Your Feelings of Sadness Can Prolong Your Grief Process. ~ Focus on The Life You Shared with Your Dog & Some of Your Favorite     Memories with Him. ~ Reach Out to Others Who Can Lend a Sympathetic Ear.  ~ Memorialize Your Dog through a Bereavement Ritual.           ~ Practice Your Own Culturally Significant Expression of Grief.          ~ Write About Your Feelings or Write a Letter to Your Dog About All the                 Things  You  Would Like to Say to Him. ~ Write an Ileene Hutchinson for Your Dog. ~ Share Photos & Memories of Your Dog Via Social Media. Continue to Consider Utilizing the Following List of Grief & Loss Support Programs for Loss of Pet: ~ The Pet Compassion Careline, Provides 24/7 Grief Support with Trained Pet     Grief Counselors (# [phone]). ~ [employer], Provides Grief Courses & 50-Minute One-on-One Support     Sessions with A Grief Counselor [phone]). ~ [employer] (# 515-330-6721). ~ Association for Pet Loss & Bereavement Support Groups, Available at     Specific Times Throughout the Week (# Z3807416). Please Contact CSW Directly (# [phone]), if You Have Questions, Need Assistance, or If Additional Social Work Needs Are Identified Between Now & Our Next Scheduled Follow-Up Outreach Call.      SDOH assessments and interventions completed:  Yes.  Care Coordination Interventions:  Yes, provided.   Follow up plan: Follow up call scheduled for 11/19/2022 at 10:30 am.  Encounter Outcome:  Pt. Visit Completed.   [name], BSW, MSW, LCSW  Licensed Restaurant manager, [employer] [facility]  Mailing [place] [address] [place], [place] [address] Physical Address-[address] [place], [place] [address] Toll Free Main # [phone] Fax # [phone] Cell # [phone] [email or web address]

## 2022-11-14 ENCOUNTER — Encounter: Payer: 59 | Admitting: Gastroenterology

## 2022-11-19 ENCOUNTER — Ambulatory Visit: Payer: Self-pay | Admitting: *Deleted

## 2022-11-19 ENCOUNTER — Encounter: Payer: Self-pay | Admitting: *Deleted

## 2022-11-19 NOTE — Patient Outreach (Signed)
Care Coordination   Follow Up Visit Note   11/19/2022  Name: Adriana [name] MRN: [id number] DOB: 04/19/1961  Adriana [name] is a 62 y.o. year old female who sees Adriana Laroche, FNP for primary care. I spoke with Adriana [name] by phone today.  What matters to the patients health and wellness today?  Reduce and Manage Symptoms of Anxiety and Depression.   Goals Addressed               This Visit's Progress     Reduce and Manage Symptoms of Anxiety and Depression. (pt-stated)   On track     Care Coordination Interventions:  Interventions Today    Flowsheet Row Most Recent Value  Chronic Disease   Chronic disease during today's visit Other  [Anxiety, Depression & Insomnia]  General Interventions   General Interventions Discussed/Reviewed General Interventions Discussed, General Interventions Reviewed, Annual Eye Exam, Labs, Durable Medical Equipment (DME), Vaccines, Health Screening, [facility], Doctor Visits, Communication with  [facility] Provider]  Labs Hgb A1c annually  Vaccines COVID-19, Flu, Pneumonia, RSV, Shingles  [Encouraged]  Doctor Visits Discussed/Reviewed Doctor Visits Discussed, Doctor Visits Reviewed, Annual Wellness Visits, PCP, Specialist  Health Screening Colonoscopy, Mammogram  [Encouraged]  PCP/Specialist Visits Compliance with follow-up visit  Communication with PCP/Specialists  Exercise Interventions   Exercise Discussed/Reviewed Exercise Discussed, Exercise Reviewed, Physical Activity, Weight Managment, Assistive device use and maintanence  Physical Activity Discussed/Reviewed Physical Activity Discussed, Physical Activity Reviewed, Types of exercise  Weight Management Weight loss  Education Interventions   Education Provided Provided Printed Education, Provided Education  Provided Verbal Education On Nutrition, Eye Care, Labs, Mental Health/Coping with Illness, Exercise, Medication, When to see the doctor, Community Resources   Mental Health Interventions   Mental Health Discussed/Reviewed Mental Health Discussed, Mental Health Reviewed, Coping Strategies, Crisis, Anxiety, Depression  Nutrition Interventions   Nutrition Discussed/Reviewed Nutrition Discussed, Nutrition Reviewed, Portion sizes, Decreasing sugar intake, Decreasing fats, Decreasing salt  Pharmacy Interventions   Pharmacy Dicussed/Reviewed Pharmacy Topics Discussed, Pharmacy Topics Reviewed, Medication Adherence, Affording Medications  Safety Interventions   Safety Discussed/Reviewed Safety Discussed, Safety Reviewed  Advanced Directive Interventions   Advanced Directives Discussed/Reviewed Advanced Directives Discussed, Advanced Directives Reviewed     Active Listening & Reflection Utilized.  Verbalization of Feelings Encouraged.  Emotional Support Provided. Relief from Symptoms of Anxiety Acknowledged. Feelings of Sadness Over Loss of Family Pet & Long-Term Friendship Validated.   Grief & Loss Support Groups Discussed. Self-Enrollment in Grief & Loss Support Group of Interest Emphasized, from List Provided: ~ The Pet Compassion Careline (# (380) 445-9912) ~ Lap of Love Counseling (# (760) 548-1003) ~ Everlife Support Group (# 989-583-4022) ~ Association for Pet Loss & Bereavement Support Groups (# Z3807416) Problem-Solving Interventions Revised. Task-Centered Activities Activated. Solution-Focused Strategies Employed.   Cognitive Behavioral Therapy Initiated. Client-Centered Solutions Performed. Acceptance & Commitment Therapy Implemented. Continued Review of Educational Material on "Coping with Grief" & Implementation of Interventions Encouraged:  ~ Acknowledge Your Grief & Give Yourself Permission to Express It.  ~ Allow Yourself to Cry. ~ Suppressing Your Feelings of Sadness Can Prolong Your Grief Process. ~ Focus on The Life You Shared with Your Dog & Some of Your Favorite Memories with Him. ~ Reach Out to Others Who Can Lend a  Sympathetic Ear.  ~ Memorialize Your Dog through a Bereavement Ritual.   ~ Practice Your Own Culturally Significant Expression of Grief.  ~ Write About Your Feelings or Write a Letter to Your Dog About All the Things You Would  Like     to Say to Him. ~ Write an Ileene Hutchinson for Your Dog. ~ Share Photos & Memories of Your Dog Via Social Media. Please Review Educational Material on "How to Leave a Toxic Relationship", Emailed on 11/19/2022: ~ If You Have Tried Setting Boundaries & The Other Person Refuses to Respect Them, It May     Be Time to End the Relationship.  ~ Though It Can Be Challenging To End a Toxic Relationship, Remember That the Most      Important Thing Is Prioritizing Yourself, Your Needs, & Your Health. ~ How You Choose to End the Relationship Depends on Your Situation & How Safe You Feel.  ~ Tell The Person Directly That You Are Choosing to End the Relationship & List Your Reasons. ~ Let The Relationship Fade Away Over Time, Slowly Communicating with This Person Less &     Less. ~ Discontinue Communication Immediately (Particularly If The Relationship is Threatening     Your Safety). ~ If You Choose to Communicate to The Person Directly, You Can Take Accountability for Your     Feelings. ~ Try to Avoid Blaming Them or Getting Defensive.  ~ You Cannot Control How They React, But You Can Try to Use Strategies to Avoid Escalating     The Discussion. ~ You May Need to Develop a Support Network in Order to [employer]  ~ If You Are Concerned About How the Person Will React, You May Choose to Speak with     Them In A Public Place.  ~ Let a Trusted Person Know When This Will Take Place & Where You Will Be, So You Can Plan     To Meet Up with Them Afterward. ~ It is Important to Focus on Your Health & Well-Being.  ~ If You Are Dealing with Someone Who Drains You of Your Energy & Happiness, Consider     Removing Them From Your Life, Or At Least Limiting Your Time Spent with Them.   ~ If You Are Experiencing Emotional or Physical Abuse, Get Help Right Away. Please Contact CSW Directly (# [phone]), if You Have Questions, Need Assistance, or If Additional Social Work Needs Are Identified Between Now & Our Next Scheduled Follow-Up Outreach Call.      SDOH assessments and interventions completed:  Yes.  Care Coordination Interventions:  Yes, provided.   Follow up plan: Follow up call scheduled for 12/03/2022 at 9:45 am.  Encounter Outcome:  Pt. Visit Completed.   [name], BSW, MSW, LCSW  Licensed Restaurant manager, [facility]  Mailing [place] [address], [place], [place] [address] Physical Address[address] [place], [place] [address] Toll Free Main # [phone] Fax # [phone] Cell # [phone] [email or web address]

## 2022-11-19 NOTE — Patient Instructions (Signed)
Visit Information  Thank you for taking time to visit with me today. Please don't hesitate to contact me if I can be of assistance to you.   Following are the goals we discussed today:   Goals Addressed               This Visit's Progress     Reduce and Manage Symptoms of Anxiety and Depression. (pt-stated)   On track     Care Coordination Interventions:  Interventions Today    Flowsheet Row Most Recent Value  Chronic Disease   Chronic disease during today's visit Other  [Anxiety, Depression & Insomnia]  General Interventions   General Interventions Discussed/Reviewed General Interventions Discussed, General Interventions Reviewed, Annual Eye Exam, Labs, Durable Medical Equipment (DME), Vaccines, Health Screening, [facility], Doctor Visits, Communication with  [facility] Care Provider]  Labs Hgb A1c annually  Vaccines COVID-19, Flu, Pneumonia, RSV, Shingles  [Encouraged]  Doctor Visits Discussed/Reviewed Doctor Visits Discussed, Doctor Visits Reviewed, Annual Wellness Visits, PCP, Specialist  Health Screening Colonoscopy, Mammogram  [Encouraged]  PCP/Specialist Visits Compliance with follow-up visit  Communication with PCP/Specialists  Exercise Interventions   Exercise Discussed/Reviewed Exercise Discussed, Exercise Reviewed, Physical Activity, Weight Managment, Assistive device use and maintanence  Physical Activity Discussed/Reviewed Physical Activity Discussed, Physical Activity Reviewed, Types of exercise  Weight Management Weight loss  Education Interventions   Education Provided Provided Printed Education, Provided Education  Provided Verbal Education On Nutrition, Eye Care, Labs, Mental Health/Coping with Illness, Exercise, Medication, When to see the doctor, Community Resources  Mental Health Interventions   Mental Health Discussed/Reviewed Mental Health Discussed, Mental Health Reviewed, Coping Strategies, Crisis, Anxiety, Depression  Nutrition Interventions    Nutrition Discussed/Reviewed Nutrition Discussed, Nutrition Reviewed, Portion sizes, Decreasing sugar intake, Decreasing fats, Decreasing salt  Pharmacy Interventions   Pharmacy Dicussed/Reviewed Pharmacy Topics Discussed, Pharmacy Topics Reviewed, Medication Adherence, Affording Medications  Safety Interventions   Safety Discussed/Reviewed Safety Discussed, Safety Reviewed  Advanced Directive Interventions   Advanced Directives Discussed/Reviewed Advanced Directives Discussed, Advanced Directives Reviewed     Active Listening & Reflection Utilized.  Verbalization of Feelings Encouraged.  Emotional Support Provided. Relief from Symptoms of Anxiety Acknowledged. Feelings of Sadness Over Loss of Family Pet & Long-Term Friendship Validated.   Grief & Loss Support Groups Discussed. Self-Enrollment in Grief & Loss Support Group of Interest Emphasized, from List Provided: ~ The Pet Compassion Careline (# 410 495 3985) ~ Lap of Love Counseling (# 437-187-4280) ~ Everlife Support Group (# (912) 062-9884) ~ Association for Pet Loss & Bereavement Support Groups (# Z3807416) Problem-Solving Interventions Revised. Task-Centered Activities Activated. Solution-Focused Strategies Employed.   Cognitive Behavioral Therapy Initiated. Client-Centered Solutions Performed. Acceptance & Commitment Therapy Implemented. Continued Review of Educational Material on "Coping with Grief" & Implementation of Interventions Encouraged:  ~ Acknowledge Your Grief & Give Yourself Permission to Express It.  ~ Allow Yourself to Cry. ~ Suppressing Your Feelings of Sadness Can Prolong Your Grief Process. ~ Focus on The Life You Shared with Your Dog & Some of Your Favorite Memories with Him. ~ Reach Out to Others Who Can Lend a Sympathetic Ear.  ~ Memorialize Your Dog through a Bereavement Ritual.   ~ Practice Your Own Culturally Significant Expression of Grief.  ~ Write About Your Feelings or Write a Letter to Your  Dog About All the Things You Would Like     to Say to Him. ~ Write an Ileene Hutchinson for Your Dog. ~ Share Photos & Memories of Your Dog Via Social Media. Please Review Educational Material  on "How to Leave a Toxic Relationship", Emailed on 11/19/2022: ~ If You Have Tried Setting Boundaries & The Other Person Refuses to Respect Them, It May     Be Time to End the Relationship.  ~ Though It Can Be Challenging To End a Toxic Relationship, Remember That the Most      Important Thing Is Prioritizing Yourself, Your Needs, & Your Health. ~ How You Choose to End the Relationship Depends on Your Situation & How Safe You Feel.  ~ Tell The Person Directly That You Are Choosing to End the Relationship & List Your Reasons. ~ Let The Relationship Fade Away Over Time, Slowly Communicating with This Person Less &     Less. ~ Discontinue Communication Immediately (Particularly If The Relationship is Threatening     Your Safety). ~ If You Choose to Communicate to The Person Directly, You Can Take Accountability for Your     Feelings. ~ Try to Avoid Blaming Them or Getting Defensive.  ~ You Cannot Control How They React, But You Can Try to Use Strategies to Avoid Escalating     The Discussion. ~ You May Need to Develop a Support Network in Order to [employer]  ~ If You Are Concerned About How the Person Will React, You May Choose to Speak with     Them In A Public Place.  ~ Let a Trusted Person Know When This Will Take Place & Where You Will Be, So You Can Plan     To Meet Up with Them Afterward. ~ It is Important to Focus on Your Health & Well-Being.  ~ If You Are Dealing with Someone Who Drains You of Your Energy & Happiness, Consider     Removing Them From Your Life, Or At Least Limiting Your Time Spent with Them.  ~ If You Are Experiencing Emotional or Physical Abuse, Get Help Right Away. Please Contact CSW Directly (# [phone]), if You Have Questions, Need Assistance, or If Additional Social Work  Needs Are Identified Between Now & Our Next Scheduled Follow-Up Outreach Call.      Our next appointment is by telephone on 12/03/2022 at 9:45 am.  Please call the care guide team at [phone] if you need to cancel or reschedule your appointment.   If you are experiencing a Mental Health or Behavioral Health Crisis or need someone to talk to, please call the Suicide and Crisis Lifeline: 988 call the [place] National Suicide Prevention Lifeline: (703) 320-5196 or TTY: 205-642-7958 TTY (939)275-8688) to talk to a trained counselor call 1-800-273-TALK (toll free, 24 hour hotline) go to [facility] [address] [place] ([phone]) call the [facility] Crisis Line: [phone] call 911  Patient verbalizes understanding of instructions and care plan provided today and agrees to view in MyChart. Active MyChart status and patient understanding of how to access instructions and care plan via MyChart confirmed with patient.     Telephone follow up appointment with care management team member scheduled for:  12/03/2022 at 9:45 am.  [name], BSW, MSW, LCSW  Licensed Clinical Social Worker  [facility]  Mailing [place], [place], [place] [address] Physical Address-[address], [place], [place] [address] Toll Free Main # [phone] Fax # [phone] Cell # [phone] [email or web address]

## 2022-11-26 ENCOUNTER — Ambulatory Visit (INDEPENDENT_AMBULATORY_CARE_PROVIDER_SITE_OTHER): Payer: 59

## 2022-11-26 ENCOUNTER — Other Ambulatory Visit: Payer: Self-pay | Admitting: Podiatry

## 2022-11-26 ENCOUNTER — Ambulatory Visit: Payer: 59 | Admitting: Podiatry

## 2022-11-26 DIAGNOSIS — M722 Plantar fascial fibromatosis: Secondary | ICD-10-CM

## 2022-11-26 DIAGNOSIS — M7731 Calcaneal spur, right foot: Secondary | ICD-10-CM

## 2022-11-26 DIAGNOSIS — B351 Tinea unguium: Secondary | ICD-10-CM | POA: Diagnosis not present

## 2022-11-26 DIAGNOSIS — M7661 Achilles tendinitis, right leg: Secondary | ICD-10-CM | POA: Diagnosis not present

## 2022-11-26 DIAGNOSIS — M7732 Calcaneal spur, left foot: Secondary | ICD-10-CM

## 2022-11-26 DIAGNOSIS — M7662 Achilles tendinitis, left leg: Secondary | ICD-10-CM

## 2022-11-26 MED ORDER — METHYLPREDNISOLONE 4 MG PO TBPK
ORAL_TABLET | ORAL | 0 refills | Status: DC
Start: 2022-11-26 — End: 2024-01-08

## 2022-11-26 MED ORDER — TAVABOROLE 5 % EX SOLN
1.0000 | Freq: Every day | CUTANEOUS | 2 refills | Status: DC
Start: 2022-11-26 — End: 2022-11-29

## 2022-11-26 NOTE — Progress Notes (Signed)
Chief Complaint  Patient presents with   Heel Spurs    Pt states she's been here before and she feels a knot. She also says she has a fungus on the left 4th toe and is wondering if her insurance could cover lazer.     HPI: 62 y.o. female presenting today with c/o pain in the back of both heels.  Denies trauma.  States is an ongoing problem for her for over 6 months.  She did previously get treated for plantar fasciitis from another provider.  That is doing better.  The right posterior heel is worse than the left.  She also has concern of possible fungal toenail to the left fourth toe.  She does have polish on which makes it a little bit more difficult to evaluate the nail morphology.  Past Medical History:  Diagnosis Date   Anemia    history   Anxiety    Benign positional vertigo 12/19/2013   Depression    Dyspnea    with exertion   Edema    Endometrial polyp    Fibroids    GERD (gastroesophageal reflux disease)    Insomnia    Internal and external hemorrhoids without complication    Migraine    Morbid obesity (HCC)    Plantar fasciitis    PMB (postmenopausal bleeding)    Prediabetes    Rectal bleeding    Reflux     Past Surgical History:  Procedure Laterality Date   DILATATION & CURETTAGE/HYSTEROSCOPY WITH MYOSURE N/A 07/23/2017   Procedure: DILATATION & CURETTAGE/HYSTEROSCOPY WITH MYOSURE;  Surgeon: Romualdo Bolk, MD;  Location: [facility];  Service: Gynecology;  Laterality: N/A;  endometrial polyp   DILATION AND CURETTAGE OF UTERUS     HYSTEROSCOPY     None      Allergies  Allergen Reactions   Hydrocodone Nausea And Vomiting    Other reaction(s): Unknown     Physical Exam: General: The patient is alert and oriented x3 in no acute distress.  Dermatology:  No ecchymosis, erythema, or edema bilateral.  No open lesions.  The left fourth toenail is 3 mm thick with yellow discoloration subungual debris distal onycholysis and pain with  compression.  This is consistent with onychomycosis.  Vascular: Palpable pedal pulses bilaterally. Capillary refill within normal limits.  +1 pitting edema bilateral lower legs and ankles.    Neurological: Light touch sensation intact bilateral.  MMT 5/5 to lower extremity bilateral. Negative Tinel's sign with percussion of the posterior tibial nerve on the affected extremity.    Musculoskeletal Exam:  There is pain on palpation of the posterior aspect of both heels .  No palpable gaps or nodules noted within the achilles tendon.  Palpable bursal sac along the retrocalcaneal aspect of the right heel.  This is not palpated on the left.  No calor or erythema is noted.  Mildly antalgic gait noted with first steps out of exam chair.  No pain on palpation of the plantar heel.  No ecchymosis to posterior heel.  Radiographic Exam (bilateral weightbearing 3 views of foot obtained on 11/26/2022):  Normal osseous mineralization. Joint spaces preserved.  No fractures noted.  There is a posterior calcaneal spur bilateral.  There is a small inferior calcaneal spur on the left foot.  Assessment/Plan of Care: 1. Achilles tendonitis, bilateral   2. Plantar fasciitis, bilateral   3. Dermatophytosis of nail   4. Calcaneal spur of left foot   5. Calcaneal  spur of right foot     Meds ordered this encounter  Medications   Tavaborole 5 % SOLN    Sig: Apply 1 Application topically daily.    Dispense:  10 mL    Refill:  2   methylPREDNISolone (MEDROL DOSEPAK) 4 MG TBPK tablet    Sig: 6 day dose pack - take as directed    Dispense:  21 tablet    Refill:  0   DG FOOT COMPLETE LEFT FOR HOME USE ONLY DME NIGHT SPLINT  -Reviewed etiology of achilles tendonitis with patient.  Discussed treatment options with patient today, including cortisone injection, NSAID course of treatment, stretching exercises, use of night splint, physical therapy, rest, icing the heel, arch supports/orthotics, and supportive shoe gear.     Patient was previously treated by other providers for plantar fasciitis.  She stated that the cortisone injection in the bottom of her heel was extremely painful and quite traumatic for her.  She would like to avoid an injection today if possible.  Therefore we will start her on a Medrol Dosepak.  She was also fitted for a posterior night splint.  Is a static AFO with a soft fabric interface.  She will wear this when sleeping she had.  She was fitted and since the product today  Regarding the fungal nail on the left fourth toe, will start the patient on a prescription topical.  Discussed that many insurances do not cover most of the prescription topical medications.  If her formulary does not cover any of the prescription topicals that are FDA approved to treat fungal nails, then will possibly recommend the laser treatment for the left fourth toenail.  This is not covered by [employer].  Return in about 4 weeks (around 12/24/2022) for recheck achilles tendonitis bilateral.  check nail fungus left 4th toe.   Clerance Lav, DPM, FACFAS [facility]     [address] [place], [place] [address]                Office [phone]  Fax [phone]

## 2022-11-29 ENCOUNTER — Other Ambulatory Visit: Payer: Self-pay | Admitting: Podiatry

## 2022-11-29 DIAGNOSIS — B351 Tinea unguium: Secondary | ICD-10-CM

## 2022-12-01 NOTE — Telephone Encounter (Signed)
Tavaborole not covered by insurance.  Rx ciclopirox instead.

## 2022-12-03 ENCOUNTER — Encounter: Payer: Self-pay | Admitting: *Deleted

## 2022-12-03 ENCOUNTER — Ambulatory Visit: Payer: Self-pay | Admitting: *Deleted

## 2022-12-03 NOTE — Patient Outreach (Addendum)
Care Coordination   Follow Up Visit Note   12/03/2022  Name: Adriana [name] MRN: [id number] DOB: 1960/07/20  Adriana [name] is a 62 y.o. year old female who sees Gilmore Laroche, FNP for primary care. I spoke with Khadeja G Levandoski by phone today.  What matters to the patients health and wellness today?  Reduce and Manage Symptoms of Anxiety and Depression.   Goals Addressed               This Visit's Progress     Reduce and Manage Symptoms of Anxiety and Depression. (pt-stated)   On track     Care Coordination Interventions:   Interventions Today     Flowsheet Row Most Recent Value  Chronic Disease    Chronic disease during today's visit Other  [Anxiety, Depression & Insomnia]  General Interventions    General Interventions Discussed/Reviewed General Interventions Discussed, General Interventions Reviewed, Annual Eye Exam, Labs, Durable Medical Equipment (DME), Vaccines, Health Screening, [facility], Doctor Visits, Communication with  [facility]]  Labs Hgb A1c annually  Vaccines COVID-19, Flu, Pneumonia, RSV, Shingles  [Encouraged]  Doctor Visits Discussed/Reviewed Doctor Visits Discussed, Doctor Visits Reviewed, Annual Wellness Visits, PCP, Specialist  Health Screening Colonoscopy, Mammogram  [Encouraged]  PCP/Specialist Visits Compliance with follow-up visit  Communication with PCP/Specialists  Exercise Interventions    Exercise Discussed/Reviewed Exercise Discussed, Exercise Reviewed, Physical Activity, Weight Managment, Assistive device use and maintanence  Physical Activity Discussed/Reviewed Physical Activity Discussed, Physical Activity Reviewed, Types of exercise  Weight Management Weight loss  Education Interventions    Education Provided Provided Printed Education, Provided Education  Provided Verbal Education On Nutrition, Eye Care, Labs, Mental Health/Coping with Illness, Exercise, Medication, When to see the doctor, Community  Resources  Mental Health Interventions    Mental Health Discussed/Reviewed Mental Health Discussed, Mental Health Reviewed, Coping Strategies, Crisis, Anxiety, Depression  Nutrition Interventions    Nutrition Discussed/Reviewed Nutrition Discussed, Nutrition Reviewed, Portion sizes, Decreasing sugar intake, Decreasing fats, Decreasing salt  Pharmacy Interventions    Pharmacy Dicussed/Reviewed Pharmacy Topics Discussed, Pharmacy Topics Reviewed, Medication Adherence, Affording Medications  Safety Interventions    Safety Discussed/Reviewed Safety Discussed, Safety Reviewed  Advanced Directive Interventions    Advanced Directives Discussed/Reviewed Advanced Directives Discussed, Advanced Directives Reviewed   Active Listening & Reflection Utilized.  Verbalization of Feelings Encouraged.  Emotional Support Provided. Symptoms of Anxiety Acknowledged. Feelings of Sadness & Grief Validated.   Grief & Loss Support Groups Discussed. Self-Enrollment in Grief & Loss Support Group of Interest Emphasized, from List Provided: Problem-Solving Interventions Activated. Task-Centered Activities Indicated. Solution-Focused Strategies Employed.   Cognitive Behavioral Therapy Initiated. Client-Centered Solutions Performed. Acceptance & Commitment Therapy Implemented. Deep Breathing Exercises, Relaxation Techniques & Mindfulness Meditation Strategies Reviewed & Encouraged Daily. Confirmed Receipt & Thoroughly Reviewed Educational Material on "How to Leave a Toxic Relationship", to Ensure Understanding & Entertain Questions: ~ If You Have Tried Setting Boundaries & The Other Person Refuses to Respect Them, It May     Be Time to End the Relationship.  ~ Though It Can Be Challenging To End a Toxic Relationship, Remember That the Most      Important Thing Is Prioritizing Yourself, Your Needs, & Your Health. ~ How You Choose to End the Relationship Depends on Your Situation & How Safe You Feel.  ~ Tell The  Person Directly That You Are Choosing to End the Relationship & List Your Reasons. ~ Let The Relationship Fade Away Over Time, Slowly Communicating with This Person Less &  Less. ~ Discontinue Communication Immediately (Particularly If The Relationship is Threatening     Your Safety). ~ If You Choose to Communicate to The Person Directly, You Can Take Accountability for Your     Feelings. ~ Try to Avoid Blaming Them or Getting Defensive.  ~ You Cannot Control How They React, But You Can Try to Use Strategies to Avoid Escalating     The Discussion. ~ You May Need to Develop a Support Network in Order to [employer]  ~ If You Are Concerned About How the Person Will React, You May Choose to Speak with     Them In A Public Place.  ~ Let a Trusted Person Know When This Will Take Place & Where You Will Be, So You Can Plan     To Meet Up with Them Afterward. ~ It is Important to Focus on Your Health & Well-Being.  ~ If You Are Dealing with Someone Who Drains You of Your Energy & Happiness, Consider     Removing Them From Your Life, Or At Least Limiting Your Time Spent with Them.  ~ If You Are Experiencing Emotional or Physical Abuse, Get Help Right Away. CSW Collaboration with Receptionist at Primary Care Provider, Family Nurse Practitioner, Gilmore Laroche with [facility] [phone]), to Schedule Earliest Follow-Up Appointment Available, Which Will Be with Dr. Trena Platt, on 12/12/2022 at 1:00 PM, to Address Back & Hip Pain, & Severe Insomnia. Please Consider Attending "Breaking Up & Moving On", Presented by Family Harvel Ricks, Kenard Gower, at [facility] [phone]), Scheduled on 12/06/2022 at 5:30 PM. Please Contact CSW Directly (# [phone]), if You Have Questions, Need Assistance, or If Additional Social Work Needs Are Identified Between Now & Our Next Scheduled Follow-Up Outreach Call, on 12/17/2022 at 10:45 AM.       SDOH assessments and interventions completed:  Yes.  Care Coordination Interventions:  Yes, provided.   Follow up plan: Follow up call scheduled for 12/17/2022 at 10:45 AM.  Encounter Outcome:  Pt. Visit Completed.   [name], BSW, MSW, LCSW  Licensed Restaurant manager, [facility]  Mailing [place] [place], [place] [address] Physical Address-[address] [place], [place] [address] Toll Free Main # [phone] Fax # [phone] Cell # [phone] [email or web address]

## 2022-12-03 NOTE — Patient Instructions (Signed)
Visit Information  Thank you for taking time to visit with me today. Please don't hesitate to contact me if I can be of assistance to you.   Following are the goals we discussed today:   Goals Addressed               This Visit's Progress     Reduce and Manage Symptoms of Anxiety and Depression. (pt-stated)   On track     Care Coordination Interventions:   Interventions Today     Flowsheet Row Most Recent Value  Chronic Disease    Chronic disease during today's visit Other  [Anxiety, Depression & Insomnia]  General Interventions    General Interventions Discussed/Reviewed General Interventions Discussed, General Interventions Reviewed, Annual Eye Exam, Labs, Durable Medical Equipment (DME), Vaccines, Health Screening, [facility], Doctor Visits, Communication with  [facility] Care Provider]  Labs Hgb A1c annually  Vaccines COVID-19, Flu, Pneumonia, RSV, Shingles  [Encouraged]  Doctor Visits Discussed/Reviewed Doctor Visits Discussed, Doctor Visits Reviewed, Annual Wellness Visits, PCP, Specialist  Health Screening Colonoscopy, Mammogram  [Encouraged]  PCP/Specialist Visits Compliance with follow-up visit  Communication with PCP/Specialists  Exercise Interventions    Exercise Discussed/Reviewed Exercise Discussed, Exercise Reviewed, Physical Activity, Weight Managment, Assistive device use and maintanence  Physical Activity Discussed/Reviewed Physical Activity Discussed, Physical Activity Reviewed, Types of exercise  Weight Management Weight loss  Education Interventions    Education Provided Provided Printed Education, Provided Education  Provided Verbal Education On Nutrition, Eye Care, Labs, Mental Health/Coping with Illness, Exercise, Medication, When to see the doctor, Community Resources  Mental Health Interventions    Mental Health Discussed/Reviewed Mental Health Discussed, Mental Health Reviewed, Coping Strategies, Crisis, Anxiety, Depression  Nutrition  Interventions    Nutrition Discussed/Reviewed Nutrition Discussed, Nutrition Reviewed, Portion sizes, Decreasing sugar intake, Decreasing fats, Decreasing salt  Pharmacy Interventions    Pharmacy Dicussed/Reviewed Pharmacy Topics Discussed, Pharmacy Topics Reviewed, Medication Adherence, Affording Medications  Safety Interventions    Safety Discussed/Reviewed Safety Discussed, Safety Reviewed  Advanced Directive Interventions    Advanced Directives Discussed/Reviewed Advanced Directives Discussed, Advanced Directives Reviewed   Active Listening & Reflection Utilized.  Verbalization of Feelings Encouraged.  Emotional Support Provided. Symptoms of Anxiety Acknowledged. Feelings of Sadness & Grief Validated.   Grief & Loss Support Groups Discussed. Self-Enrollment in Grief & Loss Support Group of Interest Emphasized, from List        Provided. Problem-Solving Interventions Activated. Task-Centered Activities Indicated. Solution-Focused Strategies Employed.   Cognitive Behavioral Therapy Initiated. Client-Centered Solutions Performed. Acceptance & Commitment Therapy Implemented. Deep Breathing Exercises, Relaxation Techniques & Mindfulness Meditation        Strategies Reviewed & Encouraged Daily. Confirmed Receipt & Thoroughly Reviewed Educational Material on "How to Leave a Toxic Relationship", to Ensure Understanding & Entertain Questions: ~ If You Have Tried Setting Boundaries & The Other Person Refuses to Respect Them, It May Be Time to End the Relationship.  ~ Though It Can Be Challenging To End a Toxic Relationship, Remember  That the Most Important Thing Is Prioritizing Yourself, Your Needs, & Your  Health. ~ How You Choose to End the Relationship Depends on Your Situation &  How Safe You Feel.  ~ Tell The Person Directly That You Are Choosing to End the Relationship &  List Your Reasons. ~ Let The Relationship Fade Away Over Time, Slowly Communicating with  This Person Less &  Less. ~ Discontinue Communication Immediately (Particularly If The Relationship  is Threatening Your Safety). ~ If You Choose to Communicate to  The Person Directly, You Can Take  Accountability for Your Feelings. ~ Try to Avoid Blaming Them or Getting Defensive.  ~ You Cannot Control How They React, But You Can Try to Use Strategies to  Avoid Escalating The Discussion. ~ You May Need to Develop a Support Network in Order to [employer]  ~ If You Are Concerned About How the Person Will React, You May Choose  to Speak with Them In A Public Place.  ~ Let a Trusted Person Know When This Will Take Place & Where You Will  Be, So You Can Plan To Meet Up with Them Afterward. ~ It is Important to Focus on Your Health & Well-Being.  ~ If You Are Dealing with Someone Who Drains You of Your Energy &  Happiness, Consider Removing Them From Your Life, Or At Least Limiting  Your Time Spent with Them.  ~ If You Are Experiencing Emotional or Physical Abuse, Get Help Right Away. CSW Collaboration with Receptionist at Primary Care Provider, Family Nurse Practitioner, Gilmore Laroche with [facility] [phone]), to Schedule Earliest Follow-Up Appointment Available, Which Will Be with Dr. Trena Platt, on 12/12/2022 at 1:00 PM, to Address Back & Hip Pain, & Severe Insomnia. Please Consider Attending "Breaking Up & Moving On", Presented by Family Harvel Ricks, Kenard Gower, at [facility] [phone]), Scheduled on 12/06/2022 at 5:30 PM. Please Contact CSW Directly (# [phone]), if You Have Questions, Need Assistance, or If Additional Social Work Needs Are Identified Between Now & Our Next Scheduled Follow-Up Outreach Call, on 12/17/2022 at 10:45 AM.      Our next appointment is by telephone on 12/17/2022 at 10:45 am.  Please call the care guide team at [phone] if you need to cancel or reschedule your appointment.   If you are experiencing  a Mental Health or Behavioral Health Crisis or need someone to talk to, please call the Suicide and Crisis Lifeline: 988 call the [place] National Suicide Prevention Lifeline: 970-792-5602 or TTY: 5083958180 TTY 413-728-1167) to talk to a trained counselor call 1-800-273-TALK (toll free, 24 hour hotline) go to [facility] [address] [place] [phone]) call the [facility] Crisis Line: [phone] call 911  Patient verbalizes understanding of instructions and care plan provided today and agrees to view in MyChart. Active MyChart status and patient understanding of how to access instructions and care plan via MyChart confirmed with patient.     Telephone follow up appointment with care management team member scheduled for:  12/17/2022 at 10:45 am.  [name], BSW, MSW, LCSW  Licensed Clinical Social Worker  [facility]  Mailing [place]. [address], [place], [place] [address] Physical Address-[address], [place], [place] [address] Toll Free Main # [phone] Fax # [phone] Cell # [phone] [email or web address]

## 2022-12-12 ENCOUNTER — Ambulatory Visit: Payer: 59 | Admitting: Internal Medicine

## 2022-12-17 ENCOUNTER — Ambulatory Visit: Payer: Self-pay | Admitting: *Deleted

## 2022-12-17 ENCOUNTER — Encounter: Payer: Self-pay | Admitting: *Deleted

## 2022-12-17 NOTE — Patient Outreach (Signed)
Care Coordination   Follow Up Visit Note   12/17/2022  Name: Adriana [name] MRN: [id number] DOB: 18-Feb-1961  Adriana [name] is a 62 y.o. year old female who sees Gilmore Laroche, FNP for primary care. I spoke with Adriana [name] by phone today.  What matters to the patients health and wellness today?  Reduce and Manage Symptoms of Anxiety and Depression.   Goals Addressed               This Visit's Progress     Reduce and Manage Symptoms of Anxiety and Depression. (pt-stated)   On track     Care Coordination Interventions:   Interventions Today     Flowsheet Row Most Recent Value  Chronic Disease    Chronic disease during today's visit Other  [Anxiety, Depression & Insomnia]  General Interventions    General Interventions Discussed/Reviewed General Interventions Discussed, General Interventions Reviewed, Annual Eye Exam, Labs, Durable Medical Equipment (DME), Vaccines, Health Screening, [facility], Doctor Visits, Communication with  [facility] Care Provider]  Labs Hgb A1c annually  Vaccines COVID-19, Flu, Pneumonia, RSV, Shingles  [Encouraged]  Doctor Visits Discussed/Reviewed Doctor Visits Discussed, Doctor Visits Reviewed, Annual Wellness Visits, PCP, Specialist  Health Screening Colonoscopy, Mammogram  [Encouraged]  PCP/Specialist Visits Compliance with follow-up visit  Communication with PCP/Specialists  Exercise Interventions    Exercise Discussed/Reviewed Exercise Discussed, Exercise Reviewed, Physical Activity, Weight Managment, Assistive device use and maintanence  Physical Activity Discussed/Reviewed Physical Activity Discussed, Physical Activity Reviewed, Types of exercise  Weight Management Weight loss  Education Interventions    Education Provided Provided Printed Education, Provided Education  Provided Verbal Education On Nutrition, Eye Care, Labs, Mental Health/Coping with Illness, Exercise, Medication, When to see the doctor, Community  Resources  Mental Health Interventions    Mental Health Discussed/Reviewed Mental Health Discussed, Mental Health Reviewed, Coping Strategies, Crisis, Anxiety, Depression  Nutrition Interventions    Nutrition Discussed/Reviewed Nutrition Discussed, Nutrition Reviewed, Portion sizes, Decreasing sugar intake, Decreasing fats, Decreasing salt  Pharmacy Interventions    Pharmacy Dicussed/Reviewed Pharmacy Topics Discussed, Pharmacy Topics Reviewed, Medication Adherence, Affording Medications  Safety Interventions    Safety Discussed/Reviewed Safety Discussed, Safety Reviewed  Advanced Directive Interventions    Advanced Directives Discussed/Reviewed Advanced Directives Discussed, Advanced Directives Reviewed   Active Listening & Reflection Utilized.  Verbalization of Feelings Encouraged.  Emotional Support Provided. Symptoms of Anxiety Acknowledged. Feelings of Sadness & Grief Validated.   Problem-Solving Interventions Activated. Task-Centered Activities Indicated. Solution-Focused Strategies Employed.   Cognitive Behavioral Therapy Implemented. Client-Centered Solutions Performed. Acceptance & Commitment Therapy Initiated. Implementation of Deep Breathing Exercises, Relaxation Techniques, & Mindfulness Meditation Strategies Encouraged Daily. Continued Review of Educational Material on "How to Leave a Toxic Relationship": ~ Set Boundaries. ~ Prioritize Yourself.  ~ Terminate the Relationship.  ~ Discontinue Communication. ~ Avoid Blaming. ~ Try Not to Get Defensive. ~ Develop a Support Network.  ~ Focus on Your Health & Well-Being.  Please Contact CSW Directly (# [phone]), if You Have Questions, Need Assistance, or If Additional Social Work Needs Are Identified Between Now & Our Next Scheduled Follow-Up Outreach Call, on 12/17/2022 at 10:45 AM.      SDOH assessments and interventions completed:  Yes.  Care Coordination Interventions:  Yes, provided.   Follow up plan: Follow  up call scheduled for 01/01/2023 at 9:15 am.  Encounter Outcome:  Pt. Visit Completed.   [name], BSW, MSW, LCSW  Licensed Database administrator  Address[address], [place], [place] [address] Physical Address-[address], [place], [place] [address] Toll Free Main # [phone] Fax # [phone] Cell # [phone] [email or web address]

## 2022-12-17 NOTE — Patient Instructions (Signed)
Visit Information  Thank you for taking time to visit with me today. Please don't hesitate to contact me if I can be of assistance to you.   Following are the goals we discussed today:   Goals Addressed               This Visit's Progress     Reduce and Manage Symptoms of Anxiety and Depression. (pt-stated)   On track     Care Coordination Interventions:   Interventions Today     Flowsheet Row Most Recent Value  Chronic Disease    Chronic disease during today's visit Other  [Anxiety, Depression & Insomnia]  General Interventions    General Interventions Discussed/Reviewed General Interventions Discussed, General Interventions Reviewed, Annual Eye Exam, Labs, Durable Medical Equipment (DME), Vaccines, Health Screening, [facility], Doctor Visits, Communication with  [facility] Care Provider]  Labs Hgb A1c annually  Vaccines COVID-19, Flu, Pneumonia, RSV, Shingles  [Encouraged]  Doctor Visits Discussed/Reviewed Doctor Visits Discussed, Doctor Visits Reviewed, Annual Wellness Visits, PCP, Specialist  Health Screening Colonoscopy, Mammogram  [Encouraged]  PCP/Specialist Visits Compliance with follow-up visit  Communication with PCP/Specialists  Exercise Interventions    Exercise Discussed/Reviewed Exercise Discussed, Exercise Reviewed, Physical Activity, Weight Managment, Assistive device use and maintanence  Physical Activity Discussed/Reviewed Physical Activity Discussed, Physical Activity Reviewed, Types of exercise  Weight Management Weight loss  Education Interventions    Education Provided Provided Printed Education, Provided Education  Provided Verbal Education On Nutrition, Eye Care, Labs, Mental Health/Coping with Illness, Exercise, Medication, When to see the doctor, Community Resources  Mental Health Interventions    Mental Health Discussed/Reviewed Mental Health Discussed, Mental Health Reviewed, Coping Strategies, Crisis, Anxiety, Depression  Nutrition  Interventions    Nutrition Discussed/Reviewed Nutrition Discussed, Nutrition Reviewed, Portion sizes, Decreasing sugar intake, Decreasing fats, Decreasing salt  Pharmacy Interventions    Pharmacy Dicussed/Reviewed Pharmacy Topics Discussed, Pharmacy Topics Reviewed, Medication Adherence, Affording Medications  Safety Interventions    Safety Discussed/Reviewed Safety Discussed, Safety Reviewed  Advanced Directive Interventions    Advanced Directives Discussed/Reviewed Advanced Directives Discussed, Advanced Directives Reviewed   Active Listening & Reflection Utilized.  Verbalization of Feelings Encouraged.  Emotional Support Provided. Symptoms of Anxiety Acknowledged. Feelings of Sadness & Grief Validated.   Problem-Solving Interventions Activated. Task-Centered Activities Indicated. Solution-Focused Strategies Employed.   Cognitive Behavioral Therapy Implemented. Client-Centered Solutions Performed. Acceptance & Commitment Therapy Initiated. Implementation of Deep Breathing Exercises, Relaxation Techniques, & Mindfulness Meditation Strategies Encouraged Daily. Continued Review of Educational Material on "How to Leave a Toxic Relationship": ~ Set Boundaries. ~ Prioritize Yourself.  ~ Terminate the Relationship.  ~ Discontinue Communication. ~ Avoid Blaming. ~ Try Not to Get Defensive. ~ Develop a Support Network.  ~ Focus on Your Health & Well-Being.  Please Contact CSW Directly (# [phone]), if You Have Questions, Need Assistance, or If Additional Social Work Needs Are Identified Between Now & Our Next Scheduled Follow-Up Outreach Call, on 12/17/2022 at 10:45 AM.      Our next appointment is by telephone on 01/01/2023 at 9:15 am.  Please call the care guide team at [phone] if you need to cancel or reschedule your appointment.   If you are experiencing a Mental Health or Behavioral Health Crisis or need someone to talk to, please call the Suicide and Crisis Lifeline:  988 call the [place] National Suicide Prevention Lifeline: (863)515-9542 or TTY: 503-694-4418 TTY 630-401-9904) to talk to a trained counselor call 1-800-273-TALK (toll free, 24 hour hotline) go to [facility] [address], [place] [id number]) call the [facility] Line: [phone] call 911  Patient verbalizes understanding of instructions and care plan provided today and agrees to view in MyChart. Active MyChart status and patient understanding of how to access instructions and care plan via MyChart confirmed with patient.     Telephone follow up appointment with care management team member scheduled for:  01/01/2023 at 9:15 am.  [name], BSW, MSW, LCSW  Licensed Clinical Social Worker  [facility]  Mailing [place]. [address], [place], [place] [address] Physical Address-[address] [place], [place] [address] Toll Free Main # [phone] Fax # [phone] Cell # [phone] [email or web address]

## 2022-12-28 ENCOUNTER — Ambulatory Visit (AMBULATORY_SURGERY_CENTER): Payer: 59 | Admitting: *Deleted

## 2022-12-28 VITALS — Ht 66.5 in | Wt 255.0 lb

## 2022-12-28 DIAGNOSIS — Z86010 Personal history of colonic polyps: Secondary | ICD-10-CM

## 2022-12-28 DIAGNOSIS — K219 Gastro-esophageal reflux disease without esophagitis: Secondary | ICD-10-CM

## 2022-12-28 MED ORDER — NA SULFATE-K SULFATE-MG SULF 17.5-3.13-1.6 GM/177ML PO SOLN
1.0000 | Freq: Once | ORAL | 0 refills | Status: AC
Start: 2022-12-28 — End: 2022-12-28

## 2022-12-28 NOTE — Progress Notes (Signed)
Pt's name and DOB verified at the beginning of the pre-visit.  Pt denies any difficulty with ambulating,sitting, laying down or rolling side to side Gave both [facility] main # and MD on call # prior to instructions.  No egg or soy allergy known to patient  No issues known to pt with past sedation with any surgeries or procedures Patient denies ever being intubated Pt has no issues moving head neck does have issues with  swallowing No FH of Malignant Hyperthermia Pt is not on diet pills Pt is not on home 02  Pt is not on blood thinners  Pt denies issues with constipation  Pt is not on dialysis Pt denise any abnormal heart rhythms  Pt denies any upcoming cardiac testing Pt encouraged to use to use Singlecare or Goodrx to reduce cost  Patient's chart reviewed by Cathlyn Parsons CNRA prior to pre-visit and patient appropriate for the [facility].  Pre-visit completed and red dot placed by patient's name on their procedure day (on provider's schedule).  . Visit by phone Pt states weight is 255 LB Instructed pt why it is important to and  to call if they have any changes in health or new medications. Directed them to the # given and on instructions.   Pt states they will.  Instructions reviewed with pt and pt states understanding. Instructed to review again prior to procedure. Pt states they will.   Pt desired to reschedule so RN sent pt to have it done Pt resceduled for 03/22/03 @ 3pm RN redid all education for procedure with correct dates and sent it via mail with coupon and by my chart

## 2023-01-01 ENCOUNTER — Ambulatory Visit: Payer: Self-pay | Admitting: *Deleted

## 2023-01-01 ENCOUNTER — Encounter: Payer: Self-pay | Admitting: *Deleted

## 2023-01-01 NOTE — Patient Instructions (Signed)
Visit Information  Thank you for taking time to visit with me today. Please don't hesitate to contact me if I can be of assistance to you.   Following are the goals we discussed today:   Goals Addressed               This Visit's Progress     Reduce and Manage Symptoms of Anxiety and Depression. (pt-stated)   On track     Care Coordination Interventions:   Interventions Today     Flowsheet Row Most Recent Value  Chronic Disease    Chronic disease during today's visit Other  [Anxiety, Depression & Insomnia]  General Interventions    General Interventions Discussed/Reviewed General Interventions Discussed, General Interventions Reviewed, Annual Eye Exam, Labs, Durable Medical Equipment (DME), Vaccines, Health Screening, [facility], Doctor Visits, Communication with  [facility] Care Provider]  Labs Hgb A1c annually  Vaccines COVID-19, Flu, Pneumonia, RSV, Shingles  [Encouraged]  Doctor Visits Discussed/Reviewed Doctor Visits Discussed, Doctor Visits Reviewed, Annual Wellness Visits, PCP, Specialist  Health Screening Colonoscopy, Mammogram  [Encouraged]  PCP/Specialist Visits Compliance with follow-up visit  Communication with PCP/Specialists  Exercise Interventions    Exercise Discussed/Reviewed Exercise Discussed, Exercise Reviewed, Physical Activity, Weight Managment, Assistive device use and maintanence  Physical Activity Discussed/Reviewed Physical Activity Discussed, Physical Activity Reviewed, Types of exercise  Weight Management Weight loss  Education Interventions    Education Provided Provided Printed Education, Provided Education  Provided Verbal Education On Nutrition, Eye Care, Labs, Mental Health/Coping with Illness, Exercise, Medication, When to see the doctor, Community Resources  Mental Health Interventions    Mental Health Discussed/Reviewed Mental Health Discussed, Mental Health Reviewed, Coping Strategies, Crisis, Anxiety, Depression  Nutrition  Interventions    Nutrition Discussed/Reviewed Nutrition Discussed, Nutrition Reviewed, Portion sizes, Decreasing sugar intake, Decreasing fats, Decreasing salt  Pharmacy Interventions    Pharmacy Dicussed/Reviewed Pharmacy Topics Discussed, Pharmacy Topics Reviewed, Medication Adherence, Affording Medications  Safety Interventions    Safety Discussed/Reviewed Safety Discussed, Safety Reviewed  Advanced Directive Interventions    Advanced Directives Discussed/Reviewed Advanced Directives Discussed, Advanced Directives Reviewed    Active Listening & Reflection Utilized.  Verbalization of Feelings Encouraged.  Emotional Support Provided. Symptoms of Anxiety Acknowledged. Feelings of Sadness & Grief Validated.   Problem-Solving Interventions Activated. Task-Centered Activities Indicated. Solution-Focused Strategies Employed.   Cognitive Behavioral Therapy Implemented. Client-Centered Solutions Performed. Acceptance & Commitment Therapy Initiated. Implementation of Deep Breathing Exercises, Relaxation Techniques, & Mindfulness Meditation Strategies Encouraged Daily. Please Continue to Administer Medications Exactly as Prescribed. Please Increase Level of Activity & Exercise, as Tolerated. Please Review Educational Material on "How To Cope When You Have Too Much On Your Plate", Emailed on 01/01/2023: 1. Figure Out Your Purpose & Mission. 2. Take A Step Back & Examine Your Life. 3. Break It Down to Avoid Becoming Overwhelmed. 4. Write It All Down.  5. Practice Saying "No". 6. Learn How To Prioritize. 7. Develop A Growing List of "To Do's". 8. Let Go Of Your Need To Be Perfect.  9. Skip The Multitasking & Stop Trying To Juggle Everything At Once. 10. Delegate Whenever Possible.  Please Contact CSW Directly (# [phone]), if You Have Questions, Need Assistance, or If Additional Social Work Needs Are Identified Between Now & Our Next Scheduled Follow-Up Outreach Call, on 12/17/2022 at  10:45 AM.      Our next appointment is by telephone on 01/15/2023 at 3:15 pm.   Please call the care guide team at [phone] if you need to cancel or reschedule  your appointment.   If you are experiencing a Mental Health or Behavioral Health Crisis or need someone to talk to, please call the Suicide and Crisis Lifeline: 988 call the [place] National Suicide Prevention Lifeline: 365-226-5763 or TTY: 435-831-8454 TTY 250-054-0894) to talk to a trained counselor call 1-800-273-TALK (toll free, 24 hour hotline) go to [facility] [address], [place] [phone]) call the [facility] Crisis Line: [phone] call 911  Patient verbalizes understanding of instructions and care plan provided today and agrees to view in MyChart. Active MyChart status and patient understanding of how to access instructions and care plan via MyChart confirmed with patient.     Telephone follow up appointment with care management team member scheduled for:  01/15/2023 at 3:15 pm.   [name], BSW, MSW, LCSW  Licensed Clinical Social Worker  [facility]  Mailing [place]. [address], [place], [place] [address] Physical Address[address] [place], [place] [address] Toll Free Main # [phone] Fax # [phone] Cell # [phone] [email or web address]

## 2023-01-01 NOTE — Patient Outreach (Signed)
Care Coordination   Follow Up Visit Note   01/01/2023  Name: Adriana [name] MRN: [id number] DOB: [birth year]  Adriana [name] is a 62 y.o. year old female who sees Gilmore Laroche, FNP for primary care. I spoke with Adriana [name] by phone today.  What matters to the patients health and wellness today?  Reduce and Manage My Symptoms of Anxiety and Depression.   Goals Addressed               This Visit's Progress     Reduce and Manage Symptoms of Anxiety and Depression. (pt-stated)   On track     Care Coordination Interventions:   Interventions Today     Flowsheet Row Most Recent Value  Chronic Disease    Chronic disease during today's visit Other  [Anxiety, Depression & Insomnia]  General Interventions    General Interventions Discussed/Reviewed General Interventions Discussed, General Interventions Reviewed, Annual Eye Exam, Labs, Durable Medical Equipment (DME), Vaccines, Health Screening, [facility], Doctor Visits, Communication with  [facility] Care Provider]  Labs Hgb A1c annually  Vaccines COVID-19, Flu, Pneumonia, RSV, Shingles  [Encouraged]  Doctor Visits Discussed/Reviewed Doctor Visits Discussed, Doctor Visits Reviewed, Annual Wellness Visits, PCP, Specialist  Health Screening Colonoscopy, Mammogram  [Encouraged]  PCP/Specialist Visits Compliance with follow-up visit  Communication with PCP/Specialists  Exercise Interventions    Exercise Discussed/Reviewed Exercise Discussed, Exercise Reviewed, Physical Activity, Weight Managment, Assistive device use and maintanence  Physical Activity Discussed/Reviewed Physical Activity Discussed, Physical Activity Reviewed, Types of exercise  Weight Management Weight loss  Education Interventions    Education Provided Provided Printed Education, Provided Education  Provided Verbal Education On Nutrition, Eye Care, Labs, Mental Health/Coping with Illness, Exercise, Medication, When to see the doctor, Community  Resources  Mental Health Interventions    Mental Health Discussed/Reviewed Mental Health Discussed, Mental Health Reviewed, Coping Strategies, Crisis, Anxiety, Depression  Nutrition Interventions    Nutrition Discussed/Reviewed Nutrition Discussed, Nutrition Reviewed, Portion sizes, Decreasing sugar intake, Decreasing fats, Decreasing salt  Pharmacy Interventions    Pharmacy Dicussed/Reviewed Pharmacy Topics Discussed, Pharmacy Topics Reviewed, Medication Adherence, Affording Medications  Safety Interventions    Safety Discussed/Reviewed Safety Discussed, Safety Reviewed  Advanced Directive Interventions    Advanced Directives Discussed/Reviewed Advanced Directives Discussed, Advanced Directives Reviewed    Active Listening & Reflection Utilized.  Verbalization of Feelings Encouraged.  Emotional Support Provided. Symptoms of Anxiety Acknowledged. Feelings of Sadness & Grief Validated.   Problem-Solving Interventions Activated. Task-Centered Activities Indicated. Solution-Focused Strategies Employed.   Cognitive Behavioral Therapy Implemented. Client-Centered Solutions Performed. Acceptance & Commitment Therapy Initiated. Implementation of Deep Breathing Exercises, Relaxation Techniques, & Mindfulness Meditation Strategies Encouraged Daily. Please Continue to Administer Medications Exactly as Prescribed. Please Increase Level of Activity & Exercise, as Tolerated. Please Review Educational Material on "How To Cope When You Have Too Much On Your Plate", Emailed on 01/01/2023: 1. Figure Out Your Purpose & Mission. 2. Take A Step Back & Examine Your Life. 3. Break It Down to Avoid Becoming Overwhelmed. 4. Write It All Down.  5. Practice Saying "No". 6. Learn How To Prioritize. 7. Develop A Growing List of "To Do's". 8. Let Go Of Your Need To Be Perfect.  9. Skip The Multitasking & Stop Trying To Juggle Everything At Once. 10. Delegate Whenever Possible.  Please Contact CSW Directly  (# [phone]), if You Have Questions, Need Assistance, or If Additional Social Work Needs Are Identified Between Now & Our Next Scheduled Follow-Up Outreach Call, on 12/17/2022 at 10:45 AM.  SDOH assessments and interventions completed:  Yes.  Care Coordination Interventions:  Yes, provided.   Follow up plan: Follow up call scheduled for 01/15/2023 at 3:15 pm.   Encounter Outcome:  Pt. Visit Completed.   [name], BSW, MSW, LCSW  Licensed Restaurant manager, [employer] [facility]  Mailing [place] [address], [place], [place] [address] Physical Address[address] [place], [place] [address] Toll Free Main # [phone] Fax # [phone] Cell # [phone] [email or web address]

## 2023-01-15 ENCOUNTER — Ambulatory Visit: Payer: Self-pay | Admitting: *Deleted

## 2023-01-15 ENCOUNTER — Encounter: Payer: Self-pay | Admitting: *Deleted

## 2023-01-15 NOTE — Patient Outreach (Signed)
Care Coordination   Follow Up Visit Note   01/15/2023  Name: Adriana [name] MRN: [id number] DOB: [birth year]  Adriana [name] is a 62 y.o. year old female who sees Gilmore Laroche, FNP for primary care. I spoke with Adriana [name] by phone today.  What matters to the patients health and wellness today?  Reduce & Manage Symptoms of Anxiety & Depression.   Goals Addressed               This Visit's Progress     Reduce and Manage Symptoms of Anxiety and Depression. (pt-stated)   On track     Care Coordination Interventions:   Interventions Today     Flowsheet Row Most Recent Value  Chronic Disease    Chronic disease during today's visit Other  [Anxiety, Depression & Insomnia]  General Interventions    General Interventions Discussed/Reviewed General Interventions Discussed, General Interventions Reviewed, Annual Eye Exam, Labs, Durable Medical Equipment (DME), Vaccines, Health Screening, [facility], Doctor Visits, Communication with  [facility] Care Provider]  Labs Hgb A1c annually  Vaccines COVID-19, Flu, Pneumonia, RSV, Shingles  [Encouraged]  Doctor Visits Discussed/Reviewed Doctor Visits Discussed, Doctor Visits Reviewed, Annual Wellness Visits, PCP, Specialist  Health Screening Colonoscopy, Mammogram  [Encouraged]  PCP/Specialist Visits Compliance with follow-up visit  Communication with PCP/Specialists  Exercise Interventions    Exercise Discussed/Reviewed Exercise Discussed, Exercise Reviewed, Physical Activity, Weight Managment, Assistive device use and maintanence  Physical Activity Discussed/Reviewed Physical Activity Discussed, Physical Activity Reviewed, Types of exercise  Weight Management Weight loss  Education Interventions    Education Provided Provided Printed Education, Provided Education  Provided Verbal Education On Nutrition, Eye Care, Labs, Mental Health/Coping with Illness, Exercise, Medication, When to see the doctor, Community  Resources  Mental Health Interventions    Mental Health Discussed/Reviewed Mental Health Discussed, Mental Health Reviewed, Coping Strategies, Crisis, Anxiety, Depression  Nutrition Interventions    Nutrition Discussed/Reviewed Nutrition Discussed, Nutrition Reviewed, Portion sizes, Decreasing sugar intake, Decreasing fats, Decreasing salt  Pharmacy Interventions    Pharmacy Dicussed/Reviewed Pharmacy Topics Discussed, Pharmacy Topics Reviewed, Medication Adherence, Affording Medications  Safety Interventions    Safety Discussed/Reviewed Safety Discussed, Safety Reviewed  Advanced Directive Interventions    Advanced Directives Discussed/Reviewed Advanced Directives Discussed, Advanced Directives Reviewed    Active Listening & Reflection Utilized.  Verbalization of Feelings Encouraged.  Emotional Support Provided. Symptoms of Anxiety Acknowledged. Feelings of Sadness & Grief Validated.   Problem-Solving Interventions Activated. Task-Centered Activities Indicated. Solution-Focused Strategies Employed.   Cognitive Behavioral Therapy Implemented. Client-Centered Solutions Performed. Acceptance & Commitment Therapy Initiated. Encouraged Engagement of Activities of Interest, Increased Socialization, Increased Hours of Rest & Relaxation, & Improvement of Eating Habits & Oral Intake. Encouraged Implementation of Deep Breathing Exercises, Relaxation Techniques, & Mindfulness Meditation Strategies Daily. Encouraged Administration of Medications, Exactly as Prescribed. Encouraged Increased Level of Activity & Exercise, as Tolerated. Encouraged Termination of Unhealthy Relationships, by Slowing Decreasing Interactions & Communication. Encouraged Review of Educational Material on "How To Cope When You Have Too Much On Your Plate", to Ensure Understanding & Entertain Questions: 1. Figure Out Your Purpose & Mission. 2. Take A Step Back & Examine Your Life. 3. Break It Down to Avoid Becoming  Overwhelmed. 4. Write It All Down.  5. Practice Saying "No". 6. Learn How To Prioritize. 7. Develop A Growing List of "To Do's". 8. Let Go Of Your Need To Be Perfect.  9. Skip The Multitasking & Stop Trying To Juggle Everything At Once. 10. Delegate Whenever Possible.  Encouraged Contact with CSW (# [phone]), if You Have Questions, Need Assistance, or If Additional Social Work Needs Are Identified Between Now & Our Next Scheduled Follow-Up Outreach Call.      SDOH assessments and interventions completed:  Yes.  Care Coordination Interventions:  Yes, provided.   Follow up plan: Follow up call scheduled for 02/13/2023 at 9:45 am.   Encounter Outcome:  Pt. Visit Completed.   [name], BSW, MSW, LCSW  Licensed Restaurant manager, fast food [facility]  Mailing [place] [address] [place], [place] [address] Physical Address[address] [place], [place] [address] Toll Free Main # [phone] Fax # [phone] Cell # [phone] [email or web address]

## 2023-01-15 NOTE — Patient Instructions (Signed)
Visit Information  Thank you for taking time to visit with me today. Please don't hesitate to contact me if I can be of assistance to you.   Following are the goals we discussed today:   Goals Addressed               This Visit's Progress     Reduce and Manage Symptoms of Anxiety and Depression. (pt-stated)   On track     Care Coordination Interventions:   Interventions Today     Flowsheet Row Most Recent Value  Chronic Disease    Chronic disease during today's visit Other  [Anxiety, Depression & Insomnia]  General Interventions    General Interventions Discussed/Reviewed General Interventions Discussed, General Interventions Reviewed, Annual Eye Exam, Labs, Durable Medical Equipment (DME), Vaccines, Health Screening, [facility], Doctor Visits, Communication with  [facility] Care Provider]  Labs Hgb A1c annually  Vaccines COVID-19, Flu, Pneumonia, RSV, Shingles  [Encouraged]  Doctor Visits Discussed/Reviewed Doctor Visits Discussed, Doctor Visits Reviewed, Annual Wellness Visits, PCP, Specialist  Health Screening Colonoscopy, Mammogram  [Encouraged]  PCP/Specialist Visits Compliance with follow-up visit  Communication with PCP/Specialists  Exercise Interventions    Exercise Discussed/Reviewed Exercise Discussed, Exercise Reviewed, Physical Activity, Weight Managment, Assistive device use and maintanence  Physical Activity Discussed/Reviewed Physical Activity Discussed, Physical Activity Reviewed, Types of exercise  Weight Management Weight loss  Education Interventions    Education Provided Provided Printed Education, Provided Education  Provided Verbal Education On Nutrition, Eye Care, Labs, Mental Health/Coping with Illness, Exercise, Medication, When to see the doctor, Community Resources  Mental Health Interventions    Mental Health Discussed/Reviewed Mental Health Discussed, Mental Health Reviewed, Coping Strategies, Crisis, Anxiety, Depression  Nutrition  Interventions    Nutrition Discussed/Reviewed Nutrition Discussed, Nutrition Reviewed, Portion sizes, Decreasing sugar intake, Decreasing fats, Decreasing salt  Pharmacy Interventions    Pharmacy Dicussed/Reviewed Pharmacy Topics Discussed, Pharmacy Topics Reviewed, Medication Adherence, Affording Medications  Safety Interventions    Safety Discussed/Reviewed Safety Discussed, Safety Reviewed  Advanced Directive Interventions    Advanced Directives Discussed/Reviewed Advanced Directives Discussed, Advanced Directives Reviewed    Active Listening & Reflection Utilized.  Verbalization of Feelings Encouraged.  Emotional Support Provided. Symptoms of Anxiety Acknowledged. Feelings of Sadness & Grief Validated.   Problem-Solving Interventions Activated. Task-Centered Activities Indicated. Solution-Focused Strategies Employed.   Cognitive Behavioral Therapy Implemented. Client-Centered Solutions Performed. Acceptance & Commitment Therapy Initiated. Encouraged Engagement of Activities of Interest, Increased Socialization, Increased Hours of Rest & Relaxation, & Improvement of Eating Habits & Oral Intake. Encouraged Implementation of Deep Breathing Exercises, Relaxation Techniques, & Mindfulness Meditation Strategies Daily. Encouraged Administration of Medications, Exactly as Prescribed. Encouraged Increased Level of Activity & Exercise, as Tolerated. Encouraged Termination of Unhealthy Relationships, by Slowing Decreasing Interactions & Communication. Encouraged Review of Educational Material on "How To Cope When You Have Too Much On Your Plate", to Ensure Understanding & Entertain Questions: 1. Figure Out Your Purpose & Mission. 2. Take A Step Back & Examine Your Life. 3. Break It Down to Avoid Becoming Overwhelmed. 4. Write It All Down.  5. Practice Saying "No". 6. Learn How To Prioritize. 7. Develop A Growing List of "To Do's". 8. Let Go Of Your Need To Be Perfect.  9. Skip The  Multitasking & Stop Trying To Juggle Everything At Once. 10. Delegate Whenever Possible.  Encouraged Contact with CSW (# [phone]), if You Have Questions, Need Assistance, or If Additional Social Work Needs Are Identified Between Now & Our Next Scheduled Follow-Up Outreach Call.  Our next appointment is by telephone on 02/13/2023 at 9:45 am.   Please call the care guide team at [phone] if you need to cancel or reschedule your appointment.   If you are experiencing a Mental Health or Behavioral Health Crisis or need someone to talk to, please call the Suicide and Crisis Lifeline: 988 call the [place] National Suicide Prevention Lifeline: (435)233-0144 or TTY: 6813066635 TTY 305-701-3732) to talk to a trained counselor call 1-800-273-TALK (toll free, 24 hour hotline) go to [facility] [address], [place] [phone]) call the [facility] Crisis Line: [phone] call 911  Patient verbalizes understanding of instructions and care plan provided today and agrees to view in MyChart. Active MyChart status and patient understanding of how to access instructions and care plan via MyChart confirmed with patient.     Telephone follow up appointment with care management team member scheduled for:  02/13/2023 at 9:45 am.   [name], BSW, MSW, LCSW  Licensed Clinical Social Worker  [facility]  Mailing [place]. [address], [place], [place] [address] Physical Address[address] [place], [place] [address] Toll Free Main # [phone] Fax # [phone] Cell # [phone] [email or web address]

## 2023-01-18 ENCOUNTER — Encounter: Payer: 59 | Admitting: Gastroenterology

## 2023-02-13 ENCOUNTER — Ambulatory Visit: Payer: Self-pay | Admitting: *Deleted

## 2023-02-13 NOTE — Patient Outreach (Signed)
  Care Coordination   02/13/2023  Name: Adriana [name] MRN: [id number] DOB: 07-Jun-1961   Care Coordination Outreach Attempts:  An unsuccessful telephone outreach was attempted today to offer the patient information about available care coordination services. HIPAA compliant messages left on voicemail, providing contact information for CSW, encouraging patient to return CSW's call at her earliest convenience.  Follow Up Plan:  Additional outreach attempts will be made to offer the patient care coordination information and services.   Encounter Outcome:  No Answer.   Care Coordination Interventions:  No, not indicated.    [name], BSW, MSW, Printmaker Social Work Case Set designer Health  [facility], Population Health Direct Dial: [phone]  Fax: [phone] Email: [email or web address] Website: [email or web address]

## 2023-02-14 ENCOUNTER — Telehealth: Payer: Self-pay | Admitting: *Deleted

## 2023-02-14 NOTE — Progress Notes (Signed)
  Care Coordination Note  02/14/2023 Name: WARDA YACKO MRN: [id number] DOB: May 05, 1961  Arizbeth G Sianez is a 62 y.o. year old female who is a primary care patient of Gilmore Laroche, FNP and is actively engaged with the care management team. I reached out to Jarrah G Loyer by phone today to assist with re-scheduling a follow up visit with the Licensed Clinical Social Worker  Follow up plan: Unsuccessful telephone outreach attempt made. A HIPAA compliant phone message was left for the patient providing contact information and requesting a return call.   [facility]  Care Coordination Care Guide  Direct Dial: [phone]

## 2023-02-20 NOTE — Progress Notes (Signed)
  Care Coordination Note  02/20/2023 Name: Adriana [name] MRN: [id number] DOB: [birth year]  [name] is a 62 y.o. year old female who is a primary care patient of Gilmore Laroche, FNP and is actively engaged with the care management team. I reached out to Courney G Wesche by phone today to assist with re-scheduling a follow up visit with the Licensed Clinical Social Worker  Follow up plan: Unsuccessful telephone outreach attempt made. A HIPAA compliant phone message was left for the patient providing contact information and requesting a return call.  We have been unable to make contact with the patient for follow up. The care management team is available to follow up with the patient after provider conversation with the patient regarding recommendation for care management engagement and subsequent re-referral to the care management team.   [facility] Coordination Care Guide  Direct Dial: [phone]

## 2023-03-14 ENCOUNTER — Encounter: Payer: Self-pay | Admitting: Gastroenterology

## 2023-03-22 ENCOUNTER — Encounter: Payer: 59 | Admitting: Gastroenterology

## 2023-04-23 ENCOUNTER — Ambulatory Visit (AMBULATORY_SURGERY_CENTER): Payer: 59

## 2023-04-23 VITALS — Ht 66.5 in | Wt 156.0 lb

## 2023-04-23 DIAGNOSIS — Z860100 Personal history of colon polyps, unspecified: Secondary | ICD-10-CM

## 2023-04-23 DIAGNOSIS — R1310 Dysphagia, unspecified: Secondary | ICD-10-CM

## 2023-04-23 DIAGNOSIS — K219 Gastro-esophageal reflux disease without esophagitis: Secondary | ICD-10-CM

## 2023-04-23 MED ORDER — ONDANSETRON HCL 4 MG PO TABS: 4.0000 mg | ORAL_TABLET | ORAL | 0 refills | Status: DC

## 2023-04-23 MED ORDER — NA SULFATE-K SULFATE-MG SULF 17.5-3.13-1.6 GM/177ML PO SOLN: 1.0000 | Freq: Once | ORAL | 0 refills | Status: AC

## 2023-04-23 NOTE — Addendum Note (Signed)
Addended by: Jaquelyn Bitter on: 04/23/2023 01:40 PM   Modules accepted: Orders

## 2023-04-23 NOTE — Progress Notes (Signed)

## 2023-04-24 ENCOUNTER — Encounter: Payer: Self-pay | Admitting: Gastroenterology

## 2023-04-28 ENCOUNTER — Encounter: Payer: Self-pay | Admitting: Certified Registered Nurse Anesthetist

## 2023-05-06 ENCOUNTER — Ambulatory Visit (AMBULATORY_SURGERY_CENTER): Payer: 59 | Admitting: Gastroenterology

## 2023-05-06 ENCOUNTER — Encounter: Payer: Self-pay | Admitting: Gastroenterology

## 2023-05-06 VITALS — BP 128/80 | HR 76 | Temp 97.3°F | Resp 13 | Ht 66.5 in | Wt 156.0 lb

## 2023-05-06 DIAGNOSIS — Z860101 Personal history of adenomatous and serrated colon polyps: Secondary | ICD-10-CM | POA: Diagnosis not present

## 2023-05-06 DIAGNOSIS — K2100 Gastro-esophageal reflux disease with esophagitis, without bleeding: Secondary | ICD-10-CM

## 2023-05-06 DIAGNOSIS — D123 Benign neoplasm of transverse colon: Secondary | ICD-10-CM

## 2023-05-06 DIAGNOSIS — Z1211 Encounter for screening for malignant neoplasm of colon: Secondary | ICD-10-CM | POA: Diagnosis present

## 2023-05-06 DIAGNOSIS — K219 Gastro-esophageal reflux disease without esophagitis: Secondary | ICD-10-CM

## 2023-05-06 DIAGNOSIS — K635 Polyp of colon: Secondary | ICD-10-CM | POA: Diagnosis not present

## 2023-05-06 DIAGNOSIS — K222 Esophageal obstruction: Secondary | ICD-10-CM

## 2023-05-06 DIAGNOSIS — Z860100 Personal history of colon polyps, unspecified: Secondary | ICD-10-CM

## 2023-05-06 DIAGNOSIS — K209 Esophagitis, unspecified without bleeding: Secondary | ICD-10-CM

## 2023-05-06 DIAGNOSIS — K644 Residual hemorrhoidal skin tags: Secondary | ICD-10-CM

## 2023-05-06 DIAGNOSIS — K2950 Unspecified chronic gastritis without bleeding: Secondary | ICD-10-CM | POA: Diagnosis not present

## 2023-05-06 DIAGNOSIS — K5730 Diverticulosis of large intestine without perforation or abscess without bleeding: Secondary | ICD-10-CM | POA: Diagnosis not present

## 2023-05-06 DIAGNOSIS — K648 Other hemorrhoids: Secondary | ICD-10-CM

## 2023-05-06 DIAGNOSIS — K2970 Gastritis, unspecified, without bleeding: Secondary | ICD-10-CM

## 2023-05-06 MED ORDER — OMEPRAZOLE 40 MG PO CPDR: 40.0000 mg | DELAYED_RELEASE_CAPSULE | Freq: Two times a day (BID) | ORAL | 3 refills | Status: DC

## 2023-05-06 MED ORDER — SODIUM CHLORIDE 0.9 % IV SOLN: 500.0000 mL | Freq: Once | INTRAVENOUS | Status: AC

## 2023-05-06 NOTE — Progress Notes (Signed)
1314 Robinul 0.1 mg IV given due large amount of secretions upon assessment.  MD made aware, vss

## 2023-05-06 NOTE — Progress Notes (Signed)
[facility] History and Physical   Primary Care Physician:  Gilmore Laroche, FNP   Reason for Procedure:  GERD, dysphagia, h/o colon polyps  Plan:    EGD and colonoscopy with possible interventions as needed     HPI: Adriana [name] is a very pleasant 62 y.o. female here for EGD and colonoscopy for dysphagia, GERD and h/o colon polyps.   The risks and benefits as well as alternatives of endoscopic procedure(s) have been discussed and reviewed. All questions answered. The patient agrees to proceed.    Past Medical History:  Diagnosis Date   Anemia    history   Anxiety    Benign positional vertigo 12/19/2013   Depression    Dyspnea    with exertion   Edema    Endometrial polyp    Fibroids    GERD (gastroesophageal reflux disease)    Insomnia    Internal and external hemorrhoids without complication    Migraine    Plantar fasciitis    PMB (postmenopausal bleeding)    Prediabetes    Rectal bleeding    Reflux     Past Surgical History:  Procedure Laterality Date   COLONOSCOPY     DILATATION & CURETTAGE/HYSTEROSCOPY WITH MYOSURE N/A 07/23/2017   Procedure: DILATATION & CURETTAGE/HYSTEROSCOPY WITH MYOSURE;  Surgeon: Romualdo Bolk, MD;  Location: [facility] [place];  Service: Gynecology;  Laterality: N/A;  endometrial polyp   DILATION AND CURETTAGE OF UTERUS     HYSTEROSCOPY     None      Prior to Admission medications   Medication Sig Start Date End Date Taking? Authorizing Provider  betamethasone valerate ointment (VALISONE) 0.1 % Apply 1 Application topically 2 (two) times daily. Use for up to 1-2 weeks as needed. Patient not taking: Reported on 04/23/2023 07/19/22   Romualdo Bolk, MD  Cholecalciferol (VITAMIN D3) 25 MCG (1000 UT) CAPS Take 1 capsule (1,000 Units total) by mouth daily. 08/18/21   Donell Beers, FNP  ciclopirox (PENLAC) 8 % solution Apply topically at bedtime. Apply over nail and surrounding skin. Apply daily  over previous coat. Remove weekly with polish remover. Patient not taking: Reported on 12/28/2022 12/01/22   McCaughan, Dia D, DPM  Magnesium 400 MG TABS Take 1 tablet by mouth as needed.    [provider]  MELATONIN PO Take by mouth. As needed for sleep    [provider]  methylPREDNISolone (MEDROL DOSEPAK) 4 MG TBPK tablet 6 day dose pack - take as directed Patient not taking: Reported on 12/28/2022 11/26/22   McCaughan, Dia D, DPM  metroNIDAZOLE (METROGEL) 0.75 % vaginal gel Place 1 Applicatorful vaginally at bedtime. Use nightly x 5 nights Patient not taking: Reported on 04/23/2023 07/19/22   Romualdo Bolk, MD  omeprazole (PRILOSEC) 40 MG capsule Take 1 by mouth 30 minutes before breakfast daily Patient not taking: Reported on 12/28/2022 07/18/22   Meredith Pel, NP  ondansetron (ZOFRAN) 4 MG tablet Take 1 tablet (4 mg total) by mouth as directed. Take one Zofran 4 mg tablet 30-60 minutes before each colonoscopy prep dose 04/23/23   Napoleon Form, MD  valACYclovir (VALTREX) 500 MG tablet Take one tablet po BID x 3 days prn Patient not taking: Reported on 12/28/2022 07/19/22   Romualdo Bolk, MD    Current Outpatient Medications  Medication Sig Dispense Refill   betamethasone valerate ointment (VALISONE) 0.1 % Apply 1 Application topically 2 (two) times daily. Use for up to 1-2 weeks  as needed. (Patient not taking: Reported on 04/23/2023) 30 g 0   Cholecalciferol (VITAMIN D3) 25 MCG (1000 UT) CAPS Take 1 capsule (1,000 Units total) by mouth daily. 60 capsule 3   ciclopirox (PENLAC) 8 % solution Apply topically at bedtime. Apply over nail and surrounding skin. Apply daily over previous coat. Remove weekly with polish remover. (Patient not taking: Reported on 12/28/2022) 6.6 mL 11   Magnesium 400 MG TABS Take 1 tablet by mouth as needed.     MELATONIN PO Take by mouth. As needed for sleep     methylPREDNISolone (MEDROL DOSEPAK) 4 MG TBPK tablet 6 day dose pack -  take as directed (Patient not taking: Reported on 12/28/2022) 21 tablet 0   metroNIDAZOLE (METROGEL) 0.75 % vaginal gel Place 1 Applicatorful vaginally at bedtime. Use nightly x 5 nights (Patient not taking: Reported on 04/23/2023) 70 g 0   omeprazole (PRILOSEC) 40 MG capsule Take 1 by mouth 30 minutes before breakfast daily (Patient not taking: Reported on 12/28/2022) 30 capsule 5   ondansetron (ZOFRAN) 4 MG tablet Take 1 tablet (4 mg total) by mouth as directed. Take one Zofran 4 mg tablet 30-60 minutes before each colonoscopy prep dose 4 tablet 0   valACYclovir (VALTREX) 500 MG tablet Take one tablet po BID x 3 days prn (Patient not taking: Reported on 12/28/2022) 30 tablet 1   Current Facility-Administered Medications  Medication Dose Route Frequency Provider Last Rate Last Admin   0.9 %  sodium chloride infusion  500 mL Intravenous Once Napoleon Form, MD        Allergies as of 05/06/2023 - Review Complete 05/06/2023  Allergen Reaction Noted   Hydrocodone Nausea And Vomiting 11/12/2013    Family History  Problem Relation Age of Onset   Colon cancer Father        diagnosed in late 67s.    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    Social Determinants of Health   Financial Resource Strain: Low Risk  (03/27/2022)   Overall Financial Resource Strain (CARDIA)    Difficulty of Paying Living Expenses: Not hard at all  Food Insecurity: No Food Insecurity (03/27/2022)   Hunger Vital Sign    Worried About Running Out of Food in the Last Year: Never true    Ran Out of Food in the Last Year: Never true  Transportation Needs: No Transportation Needs (  03/27/2022)   PRAPARE - Administrator, Civil Service (Medical): No    Lack of Transportation (Non-Medical): No  Physical Activity: Inactive (03/27/2022)   Exercise Vital Sign    Days of Exercise per Week: 0 days    Minutes of Exercise per Session: 0 min  Stress: Stress Concern Present (03/27/2022)   [employer] of Occupational Health - Occupational Stress Questionnaire    Feeling of Stress : To some extent  Social Connections: Moderately Integrated (03/27/2022)   Social Connection and Isolation Panel [NHANES]    Frequency of Communication with Friends and Family: More than three times a week    Frequency of Social Gatherings with Friends and Family: More than three times a week    Attends Religious Services: More than 4 times per year    Active Member of [employer] or Organizations: Yes    Attends Engineer, structural: More than 4 times per year    Marital Status: Divorced  Intimate Partner Violence: Not At Risk (03/27/2022)   Humiliation, Afraid, Rape, and Kick questionnaire    Fear of Current or Ex-Partner: No    Emotionally Abused: No    Physically Abused: No    Sexually Abused: No    Review of Systems:  All other review of systems negative except as mentioned in the HPI.  Physical Exam: Vital signs in last 24 hours: BP (!) 162/90   Pulse 100   Temp (!) 97.3 F (36.3 C)   Ht 5' 6.5" (1.689 m)   Wt 156 lb  (70.8 kg)   LMP 01/16/2014   SpO2 96%   BMI 24.80 kg/m  General:   Alert, NAD Lungs:  Clear .   Heart:  Regular rate and rhythm Abdomen:  Soft, nontender and nondistended. Neuro/Psych:  Alert and cooperative. Normal mood and affect. A and O x 3  Reviewed labs, radiology imaging, old records and pertinent past GI work up  Patient is appropriate for planned procedure(s) and anesthesia in an ambulatory setting   K. Scherry Ran , MD [phone]

## 2023-05-06 NOTE — Patient Instructions (Signed)
YOU HAD AN ENDOSCOPIC PROCEDURE TODAY AT THE [facility]:   Refer to the procedure report that was given to you for any specific questions about what was found during the examination.  If the procedure report does not answer your questions, please call your gastroenterologist to clarify.  If you requested that your care partner not be given the details of your procedure findings, then the procedure report has been included in a sealed envelope for you to review at your convenience later.  YOU SHOULD EXPECT: Some feelings of bloating in the abdomen. Passage of more gas than usual.  Walking can help get rid of the air that was put into your GI tract during the procedure and reduce the bloating. If you had a lower endoscopy (such as a colonoscopy or flexible sigmoidoscopy) you may notice spotting of blood in your stool or on the toilet paper. If you underwent a bowel prep for your procedure, you may not have a normal bowel movement for a few days.  Please Note:  You might notice some irritation and congestion in your nose or some drainage.  This is from the oxygen used during your procedure.  There is no need for concern and it should clear up in a day or so.  SYMPTOMS TO REPORT IMMEDIATELY:  Following lower endoscopy (colonoscopy or flexible sigmoidoscopy):  Excessive amounts of blood in the stool  Significant tenderness or worsening of abdominal pains  Swelling of the abdomen that is new, acute  Fever of 100F or higher  Following upper endoscopy (EGD)  Vomiting of blood or coffee ground material  New chest pain or pain under the shoulder blades  Painful or persistently difficult swallowing  New shortness of breath  Fever of 100F or higher  Black, tarry-looking stools  For urgent or emergent issues, a gastroenterologist can be reached at any hour by calling [phone]. Do not use MyChart messaging for urgent concerns.    DIET:  Follow a POST-DILATION DIET (see handout):  Nothing by mouth until 3:00pm, then CLEAR LIQUIDS ONLY from 3:00pm to 4:00pm today, then you may proceed to a SOFT DIET beginning at 4:00pm today and continue the SOFT DIET for 3 days (through Wednesday). You may proceed to your regular diet Thursday morning as tolerated.  Drink plenty of fluids but you should avoid alcoholic beverages for 24 hours.  MEDICATIONS: Continue present medications. Use Prilosec (omeprazole) 40 mg by mouth twice daily for 3 months.  Please see handouts given to you by your recovery nurse: Gastritis, post-dilation diet, esophagitis and esophageal stricture, anti-reflux regimen, polyps. Diverticulosis, and hemorrhoids.  Follow an Anti-reflex regimen (see handout).  FOLLOW UP: Await pathology results. Repeat colonoscopy in 5 years for surveillance based on pathology results.  Thank you for allowing [place] to provide for your healthcare needs today.  ACTIVITY:  You should plan to take it easy for the rest of today and you should NOT DRIVE or use heavy machinery until tomorrow (because of the sedation medicines used during the test).    FOLLOW UP: Our staff will call the number listed on your records the next business day following your procedure.  We will call around 7:15- 8:00 am to check on you and address any questions or concerns that you may have regarding the information given to you following your procedure. If we do not reach you, we will leave a message.     If any biopsies were taken you will be contacted by phone or by letter  within the next 1-3 weeks.  Please call us at [phone] if you have not heard about the biopsies in 3 weeks.    SIGNATURES/CONFIDENTIALITY: You and/or your care partner have signed paperwork which will be entered into your electronic medical record.  These signatures attest to the fact that that the information above on your After Visit Summary has been reviewed and is understood.  Full responsibility of the confidentiality of this  discharge information lies with you and/or your care-partner.

## 2023-05-06 NOTE — Progress Notes (Signed)
Called to room to assist during endoscopic procedure.  Patient ID and intended procedure confirmed with present staff. Received instructions for my participation in the procedure from the performing physician.  

## 2023-05-06 NOTE — Progress Notes (Signed)
Pt's states no medical or surgical changes since previsit or office visit. 

## 2023-05-06 NOTE — Progress Notes (Signed)
Report given to PACU, vss 

## 2023-05-06 NOTE — Op Note (Signed)
[facility] Patient Name: Adriana [name] Procedure Date: 05/06/2023 1:24 PM MRN: [id number] Endoscopist: Napoleon Form , MD, [id number] Age: 62 Referring MD:  Date of Birth: December 31, 1960 Gender: Female Account #: [id number] Procedure:                Colonoscopy Indications:              High risk colon cancer surveillance: Personal                            history of colonic polyps, High risk colon cancer                            surveillance: Personal history of multiple (3 or                            more) adenomas Medicines:                Monitored Anesthesia Care Procedure:                Pre-Anesthesia Assessment:                           - Prior to the procedure, a History and Physical                            was performed, and patient medications and                            allergies were reviewed. The patient's tolerance of                            previous anesthesia was also reviewed. The risks                            and benefits of the procedure and the sedation                            options and risks were discussed with the patient.                            All questions were answered, and informed consent                            was obtained. Prior Anticoagulants: The patient has                            taken no anticoagulant or antiplatelet agents. ASA                            Grade Assessment: II - A patient with mild systemic                            disease. After reviewing the risks and benefits,  the patient was deemed in satisfactory condition to                            undergo the procedure.                           After obtaining informed consent, the colonoscope                            was passed under direct vision. Throughout the                            procedure, the patient's blood pressure, pulse, and                            oxygen saturations were monitored  continuously. The                            [id number]-HQ190L Colonoscope [id number] was introduced                            through the anus and advanced to the the cecum,                            identified by appendiceal orifice and ileocecal                            valve. The colonoscopy was performed without                            difficulty. The patient tolerated the procedure                            well. The quality of the bowel preparation was                            adequate. The ileocecal valve, appendiceal orifice,                            and rectum were photographed. Scope In: 1:43:58 PM Scope Out: 2:02:08 PM Scope Withdrawal Time: 0 hours 12 minutes 29 seconds  Total Procedure Duration: 0 hours 18 minutes 10 seconds  Findings:                 The perianal and digital rectal examinations were                            normal.                           A 3 mm polyp was found in the transverse colon. The                            polyp was sessile. The polyp was removed with a  cold snare. Resection and retrieval were complete.                           Scattered small-mouthed diverticula were found in                            the sigmoid colon and descending colon.                           Non-bleeding external and internal hemorrhoids were                            found during retroflexion. The hemorrhoids were                            small. Complications:            No immediate complications. Estimated Blood Loss:     Estimated blood loss was minimal. Impression:               - One 3 mm polyp in the transverse colon, removed                            with a cold snare. Resected and retrieved.                           - Diverticulosis in the sigmoid colon and in the                            descending colon.                           - Non-bleeding external and internal hemorrhoids. Recommendation:           - Patient has  a contact number available for                            emergencies. The signs and symptoms of potential                            delayed complications were discussed with the                            patient. Return to normal activities tomorrow.                            Written discharge instructions were provided to the                            patient.                           - Resume previous diet.                           - Continue present medications.                           -  Await pathology results.                           - Repeat colonoscopy in 5 years for surveillance                            based on pathology results. Napoleon Form, MD 05/06/2023 2:14:32 PM This report has been signed electronically.

## 2023-05-06 NOTE — Op Note (Signed)
[facility] Patient Name: Adriana [name] Procedure Date: 05/06/2023 1:25 PM MRN: [id number] Endoscopist: Napoleon Form , MD, [id number] Age: 62 Referring MD:  Date of Birth: 1960-10-18 Gender: Female Account #: [id number] Procedure:                Upper GI endoscopy Indications:              Dysphagia, Esophageal reflux symptoms that recur                            despite appropriate therapy Medicines:                Monitored Anesthesia Care Procedure:                Pre-Anesthesia Assessment:                           - Prior to the procedure, a History and Physical                            was performed, and patient medications and                            allergies were reviewed. The patient's tolerance of                            previous anesthesia was also reviewed. The risks                            and benefits of the procedure and the sedation                            options and risks were discussed with the patient.                            All questions were answered, and informed consent                            was obtained. Prior Anticoagulants: The patient has                            taken no anticoagulant or antiplatelet agents. ASA                            Grade Assessment: II - A patient with mild systemic                            disease. After reviewing the risks and benefits,                            the patient was deemed in satisfactory condition to                            undergo the procedure.  After obtaining informed consent, the endoscope was                            passed under direct vision. Throughout the                            procedure, the patient's blood pressure, pulse, and                            oxygen saturations were monitored continuously. The                            Olympus Scope [id number] was introduced through the                            mouth, and  advanced to the second part of duodenum.                            The upper GI endoscopy was accomplished without                            difficulty. The patient tolerated the procedure                            well. Scope In: Scope Out: Findings:                 LA Grade B (one or more mucosal breaks greater than                            5 mm, not extending between the tops of two mucosal                            folds) esophagitis with no bleeding was found 36 to                            38 cm from the incisors. Biopsies were taken with a                            cold forceps for histology.                           One benign-appearing, intrinsic mild stenosis was                            found 38 to 39 cm from the incisors. This stenosis                            measured 1.6-1.7 cm (inner diameter) x less than                            one cm (in length). The stenosis was traversed. A  TTS dilator was passed through the scope. Dilation                            with an 18-19-20 mm x 8 cm CRE balloon dilator was                            performed to 20 mm.                           Patchy mild inflammation characterized by                            congestion (edema), erythema and friability was                            found in the entire examined stomach. Biopsies were                            taken with a cold forceps for Helicobacter pylori                            testing.                           The cardia and gastric fundus were normal on                            retroflexion.                           The examined duodenum was normal. Complications:            No immediate complications. Estimated Blood Loss:     Estimated blood loss was minimal. Impression:               - LA Grade B reflux esophagitis with no bleeding.                            Biopsied.                           - Benign-appearing esophageal  stenosis. Dilated.                           - Gastritis. Biopsied.                           - Normal examined duodenum. Recommendation:           - Patient has a contact number available for                            emergencies. The signs and symptoms of potential                            delayed complications were discussed with the  patient. Return to normal activities tomorrow.                            Written discharge instructions were provided to the                            patient.                           - Slowly advance diet clears to soft diet X 3 days                            and then resume previous diet as tolerated.                           - Continue present medications.                           - Await pathology results.                           - Follow an antireflux regimen.                           - Use Prilosec (omeprazole) 40 mg PO BID for 3                            months. Napoleon Form, MD 05/06/2023 2:18:58 PM This report has been signed electronically.

## 2023-05-07 ENCOUNTER — Telehealth: Payer: Self-pay | Admitting: *Deleted

## 2023-05-07 NOTE — Telephone Encounter (Signed)
  Follow up Call-     05/06/2023   12:50 PM  Call back number  Post procedure Call Back phone  # [phone]  Permission to leave phone message Yes     Patient questions:  Do you have a fever, pain , or abdominal swelling? No. Pain Score  0 *  Have you tolerated food without any problems? Yes.    Have you been able to return to your normal activities? Yes.    Do you have any questions about your discharge instructions: Diet   No. Medications  No. Follow up visit  No.  Do you have questions or concerns about your Care? No.  Actions: * If pain score is 4 or above: No action needed, pain <4.

## 2023-05-09 LAB — SURGICAL PATHOLOGY

## 2023-05-12 NOTE — Patient Instructions (Signed)

## 2023-05-12 NOTE — Progress Notes (Unsigned)
   Established Patient Office Visit   Subjective  Patient ID: Adriana [name], female    DOB: 13-Mar-1961  Age: 62 y.o. MRN: [id number]  No chief complaint on file.   She  has a past medical history of Anemia, Anxiety, Benign positional vertigo (12/19/2013), Depression, Dyspnea, Edema, Endometrial polyp, Fibroids, GERD (gastroesophageal reflux disease), Insomnia, Internal and external hemorrhoids without complication, Migraine, Plantar fasciitis, PMB (postmenopausal bleeding), Prediabetes, Rectal bleeding, and Reflux.  HPI  ROS    Objective:     LMP 01/16/2014  {Vitals History (Optional):23777}  Physical Exam   No results found for any visits on 05/13/23.  The 10-year ASCVD risk score (Arnett DK, et al., 2019) is: 3.1%    Assessment & Plan:  There are no diagnoses linked to this encounter.  No follow-ups on file.   Cruzita Lederer Newman Nip, FNP

## 2023-05-13 ENCOUNTER — Ambulatory Visit (HOSPITAL_COMMUNITY)
Admission: RE | Admit: 2023-05-13 | Discharge: 2023-05-13 | Disposition: A | Discharge status: Home / Self Care | Payer: 59 | Source: Ambulatory Visit | Attending: Family Medicine | Admitting: Family Medicine

## 2023-05-13 ENCOUNTER — Ambulatory Visit (INDEPENDENT_AMBULATORY_CARE_PROVIDER_SITE_OTHER): Payer: 59 | Admitting: Family Medicine

## 2023-05-13 ENCOUNTER — Encounter: Payer: Self-pay | Admitting: Family Medicine

## 2023-05-13 VITALS — BP 112/75 | HR 81 | Ht 66.5 in | Wt 258.0 lb

## 2023-05-13 DIAGNOSIS — E038 Other specified hypothyroidism: Secondary | ICD-10-CM | POA: Diagnosis not present

## 2023-05-13 DIAGNOSIS — Z136 Encounter for screening for cardiovascular disorders: Secondary | ICD-10-CM

## 2023-05-13 DIAGNOSIS — R7303 Prediabetes: Secondary | ICD-10-CM | POA: Diagnosis not present

## 2023-05-13 DIAGNOSIS — G4700 Insomnia, unspecified: Secondary | ICD-10-CM

## 2023-05-13 DIAGNOSIS — M545 Low back pain, unspecified: Secondary | ICD-10-CM | POA: Insufficient documentation

## 2023-05-13 DIAGNOSIS — Z1231 Encounter for screening mammogram for malignant neoplasm of breast: Secondary | ICD-10-CM | POA: Diagnosis not present

## 2023-05-13 MED ORDER — TRAZODONE HCL 50 MG PO TABS: 50.0000 mg | ORAL_TABLET | Freq: Every evening | ORAL | 3 refills | Status: DC | PRN

## 2023-05-13 MED ORDER — CYCLOBENZAPRINE HCL 5 MG PO TABS: 5.0000 mg | ORAL_TABLET | Freq: Three times a day (TID) | ORAL | 1 refills | Status: AC | PRN

## 2023-05-13 NOTE — Assessment & Plan Note (Signed)
Xray Ordered Trial on Flexeril 5 mg PRN We discussed the desired effects and potential side effects of the prescribed medication for back pain. Additionally, we reviewed non-pharmacological interventions, including the importance of rest, avoiding twisting, improper bending, and straining the lower back. I demonstrated proper body mechanics to prevent further injury and advised alternating between ice and heat therapy for relief. Stretching exercises for both the back and legs were recommended to improve flexibility and support recovery. The patient was advised to follow up if symptoms worsen or persist. The patient expressed understanding of the treatment plan, and all questions were thoroughly addressed.

## 2023-05-13 NOTE — Assessment & Plan Note (Signed)
Trial on Trazodone 50 mg PRN at bedtime Explained to go to bed at the same time each night and get up at the same time each morning, including on the weekends. Make sure your bedroom is quiet, dark, relaxing, and at a comfortable temperature. Remove electronic devices, such as TVs, computers, and smart phones, from the bedroom.

## 2023-05-15 ENCOUNTER — Ambulatory Visit: Payer: 59 | Admitting: Obstetrics and Gynecology

## 2023-05-16 LAB — BMP8+EGFR
BUN/Creatinine Ratio: 14 (ref 12–28)
BUN: 11 mg/dL (ref 8–27)
CO2: 23 mmol/L (ref 20–29)
Calcium: 9.2 mg/dL (ref 8.7–10.3)
Chloride: 104 mmol/L (ref 96–106)
Creatinine, Ser: 0.79 mg/dL (ref 0.57–1.00)
Glucose: 94 mg/dL (ref 70–99)
Potassium: 4.1 mmol/L (ref 3.5–5.2)
Sodium: 143 mmol/L (ref 134–144)
eGFR: 86 mL/min/{1.73_m2} (ref >59)

## 2023-05-16 LAB — CBC WITH DIFFERENTIAL/PLATELET
Basophils Absolute: 0 10*3/uL (ref 0.0–0.2)
Basos: 1 %
EOS (ABSOLUTE): 0.1 10*3/uL (ref 0.0–0.4)
Eos: 2 %
Hematocrit: 39.4 % (ref 34.0–46.6)
Hemoglobin: 12.6 g/dL (ref 11.1–15.9)
Immature Grans (Abs): 0 10*3/uL (ref 0.0–0.1)
Immature Granulocytes: 0 %
Lymphocytes Absolute: 1.5 10*3/uL (ref 0.7–3.1)
Lymphs: 35 %
MCH: 30.1 pg (ref 26.6–33.0)
MCHC: 32 g/dL (ref 31.5–35.7)
MCV: 94 fL (ref 79–97)
Monocytes Absolute: 0.3 10*3/uL (ref 0.1–0.9)
Monocytes: 8 %
Neutrophils Absolute: 2.4 10*3/uL (ref 1.4–7.0)
Neutrophils: 54 %
Platelets: 208 10*3/uL (ref 150–450)
RBC: 4.19 x10E6/uL (ref 3.77–5.28)
RDW: 12.5 % (ref 11.7–15.4)
WBC: 4.3 10*3/uL (ref 3.4–10.8)

## 2023-05-16 LAB — LIPID PANEL
Chol/HDL Ratio: 3.7 {ratio} (ref 0.0–4.4)
Cholesterol, Total: 198 mg/dL (ref 100–199)
HDL: 53 mg/dL (ref >39)
LDL Chol Calc (NIH): 127 mg/dL — ABNORMAL HIGH (ref 0–99)
Triglycerides: 100 mg/dL (ref 0–149)
VLDL Cholesterol Cal: 18 mg/dL (ref 5–40)

## 2023-05-16 LAB — TSH+FREE T4
Free T4: 1.04 ng/dL (ref 0.82–1.77)
TSH: 2.24 u[IU]/mL (ref 0.450–4.500)

## 2023-05-16 LAB — HEMOGLOBIN A1C
Est. average glucose Bld gHb Est-mCnc: 123 mg/dL
Hgb A1c MFr Bld: 5.9 % — ABNORMAL HIGH (ref 4.8–5.6)

## 2023-05-20 ENCOUNTER — Ambulatory Visit: Payer: 59 | Admitting: Obstetrics and Gynecology

## 2023-05-22 ENCOUNTER — Ambulatory Visit: Payer: 59 | Admitting: Obstetrics and Gynecology

## 2023-05-27 NOTE — Progress Notes (Signed)
Please inform patient,    XRAY results:   Alignment is normal: The spine is properly aligned, which is a good sign.  No significant disc or vertebral body height loss: The discs between the bones and the bones themselves appear healthy with no major compression or damage.  Minimal endplate spurring at L4-L5 and L5-S1: Small bone growths (spurs) are forming at the edges of the vertebrae due to mild wear and tear, which is common as we age.  Facet degenerative changes: The joints at the back of the spine (facets) show mild-to-moderate wear and tear, especially at the L4-L5 level. This may cause occasional back pain or stiffness.    Plan and Treatment:  Referral to physical therapy is available if interested.   Stay Active: Low-impact exercises (walking, swimming) to strengthen back muscles. Pain Relief: Use NSAIDs (like ibuprofen) or heat/ice packs if needed. Physical Therapy: Stretching and strengthening exercises for flexibility and support. Posture & Weight Management: Maintain good posture and a healthy weight to reduce back strain. Follow-Up: If pain worsens or radiates to the legs.

## 2023-05-29 ENCOUNTER — Encounter: Payer: Self-pay | Admitting: Gastroenterology

## 2023-05-31 ENCOUNTER — Telehealth: Payer: Self-pay | Admitting: Family Medicine

## 2023-05-31 NOTE — Telephone Encounter (Signed)
Copied from CRM [id number]. Topic: Clinical - Lab/Test Results >> May 29, 2023  4:34 PM Fuller Mandril wrote: Reason for CRM: Pt called stated she would like to discuss her recent test results with clinician. She did view the results on MyChart and she has additional question/concerns. Thank You

## 2023-06-03 NOTE — Telephone Encounter (Signed)
Lmtrc

## 2023-06-04 ENCOUNTER — Other Ambulatory Visit: Payer: Self-pay | Admitting: Family Medicine

## 2023-06-24 NOTE — Telephone Encounter (Signed)
 Pt. Has been called and questions have been answered to her understanding.

## 2023-07-05 ENCOUNTER — Other Ambulatory Visit: Payer: Self-pay | Admitting: Family Medicine

## 2023-07-05 DIAGNOSIS — B9689 Other specified bacterial agents as the cause of diseases classified elsewhere: Secondary | ICD-10-CM

## 2023-07-05 NOTE — Telephone Encounter (Signed)
Copied from CRM [id number]. Topic: Clinical - Medication Refill >> Jul 05, 2023 12:50 PM Elle L wrote: Most Recent Primary Care Visit:  Provider: [facility]  Department: [facility]  Visit Type: ACUTE  Date: 05/13/2023  Medication: metroNIDAZOLE (METROGEL) 0.75 % vaginal gel   Has the patient contacted their pharmacy? Yes  Is this the correct pharmacy for this prescription? No  Departments: [facility] Address: [address] [place], [place], [place] [address]   Has the prescription been filled recently?   Is the patient out of the medication?   Has the patient been seen for an appointment in the last year OR does the patient have an upcoming appointment?   Can we respond through MyChart?   Agent: Please be advised that Rx refills may take up to 3 business days. We ask that you follow-up with your pharmacy.

## 2023-07-08 NOTE — Telephone Encounter (Signed)
Copied from CRM [id number]. Topic: Clinical - Medication Refill >> Jul 08, 2023  2:02 PM Thomes Dinning wrote: Most Recent Primary Care Visit:  Provider: [facility]  Department: [facility]  Visit Type: ACUTE  Date: 05/13/2023  Medication: metroNIDAZOLE (METROGEL) 0.75 % vaginal gel  Has the patient contacted their pharmacy? Yes (Agent: If no, request that the patient contact the pharmacy for the refill. If patient does not wish to contact the pharmacy document the reason why and proceed with request.) (Agent: If yes, when and what did the pharmacy advise?)  Is this the correct pharmacy for this prescription? Yes If no, delete pharmacy and type the correct one.  This is the patient's preferred pharmacy:   [id number] - [place], [place] - [address]  [place] [address] Phone: [phone] Fax: [phone]   Has the prescription been filled recently? Yes  Is the patient out of the medication? Yes  Has the patient been seen for an appointment in the last year OR does the patient have an upcoming appointment? Yes  Can we respond through MyChart? Yes  Agent: Please be advised that Rx refills may take up to 3 business days. We ask that you follow-up with your pharmacy.

## 2023-07-15 ENCOUNTER — Encounter: Payer: Self-pay | Admitting: Family Medicine

## 2023-07-17 ENCOUNTER — Other Ambulatory Visit: Payer: Self-pay

## 2023-07-17 DIAGNOSIS — B9689 Other specified bacterial agents as the cause of diseases classified elsewhere: Secondary | ICD-10-CM

## 2023-07-17 MED ORDER — METRONIDAZOLE 0.75 % VA GEL: 1.0000 | Freq: Every day | VAGINAL | 0 refills | Status: DC

## 2023-07-17 NOTE — Telephone Encounter (Signed)
Medication sent.

## 2023-07-18 ENCOUNTER — Other Ambulatory Visit: Payer: Self-pay | Admitting: Family Medicine

## 2023-07-18 NOTE — Telephone Encounter (Signed)
XRAY results:   Alignment is normal: The spine is properly aligned, which is a good sign.  No significant disc or vertebral body height loss: The discs between the bones and the bones themselves appear healthy with no major compression or damage.  Minimal endplate spurring at L4-L5 and L5-S1: Small bone growths (spurs) are forming at the edges of the vertebrae due to mild wear and tear, which is common as we age.  Facet degenerative changes: The joints at the back of the spine (facets) show mild-to-moderate wear and tear, especially at the L4-L5 level. This may cause occasional back pain or stiffness.    Plan and Treatment:  Referral to physical therapy is available if interested.   Stay Active: Low-impact exercises (walking, swimming) to strengthen back muscles. Pain Relief: Use NSAIDs (like ibuprofen) or heat/ice packs if needed. Physical Therapy: Stretching and strengthening exercises for flexibility and support. Posture & Weight Management: Maintain good posture and a healthy weight to reduce back strain. Follow-Up: If pain worsens or radiates to the legs.

## 2023-07-22 ENCOUNTER — Ambulatory Visit: Payer: Self-pay | Admitting: Family Medicine

## 2023-07-22 NOTE — Telephone Encounter (Signed)
Copied from CRM [id number]. Topic: Clinical - Red Word Triage >> Jul 22, 2023  3:41 PM Elle L wrote: Red Word that prompted transfer to Nurse Triage: The patient has an infection on her left middle finger. She states it is blistered and has a jelly-like substance come out of it.  Chief Complaint: Blister/drainage around cuticle Symptoms: Redness, blistering, soreness, drainage Frequency: 1 week Pertinent Negatives: Patient denies fever Disposition: [] ED /[] Urgent Care (no appt availability in office) / [x] Appointment(In office/virtual)/ []  [facility]/ [] Home Care/ [x] Refused Recommended Disposition /[] [facility]/ []  Follow-up with PCP Additional Notes: Patient called in to report a small blister and drainage on her right middle finger along the nail bed. Patient stated that she noticed irritation around the cuticle 6 weeks ago, but squeezed the area within the last week. Patient stated a small blister with a white mass has formed and it drains a clear jelly-like substance when she squeezes the area. Patient stated that the area around the nail is red and sore. Patient denied fever. This RN advised patient to see a provider within 24 hours. Patient stated that she would not be able to make an appointment tomorrow because she is working night shift tonight and tomorrow night. This RN advised that I would route this conversation to the clinic, for their discretion on scheduling. Patient is requesting a call back and and appointment.    Reason for Disposition  [1] Pus or cloudy fluid draining from wound AND [2] no fever  Answer Assessment - Initial Assessment Questions 1. LOCATION: "Where is the wound located?"      Right middle finger around nail bed 2. WOUND APPEARANCE: "What does the wound look like?"      Small blister with white mass, clear jelly-like substance comes out when applying pressure 3. SIZE: If redness is present, ask: "What is the size of the red area?" (Inches,  centimeters, or compare to size of a coin)      Yes, around the cuticle 4. SPREAD: "What's changed in the last day?"  "Do you see any red streaks coming from the wound?"     Small blister has formed and jelly-like substance comes out when squeezing the area 5. ONSET: "When did it start to look infected?"      6 weeks, squeezed the area a week ago and the blister and drainage started 6. MECHANISM: "How did the wound start, what was the cause?"     States she may have cut her cuticle 7. PAIN: Do you have any pain?"  If Yes, ask: "How bad is the pain?"  (e.g., Scale 1-10; mild, moderate, or severe)    - MILD (1-3): Doesn't interfere with normal activities.     - MODERATE (4-7): Interferes with normal activities or awakens from sleep.    - SEVERE (8-10): Excruciating pain, unable to do any normal activities.       Mild- states the area is a little sore 8. FEVER: "Do you have a fever?" If Yes, ask: "What is your temperature, how was it measured, and when did it start?"     Denies  9. OTHER SYMPTOMS: "Do you have any other symptoms?" (e.g., shaking chills, weakness, rash elsewhere on body)     Denies  Protocols used: Wound Infection Suspected-A-AH

## 2023-07-23 NOTE — Telephone Encounter (Signed)
Called left voicemail and also sent mychart message to call or schedule through mychart.
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
## 2023-10-24 ENCOUNTER — Ambulatory Visit: Payer: 59 | Admitting: Family Medicine

## 2023-10-24 ENCOUNTER — Encounter: Payer: Self-pay | Admitting: Family Medicine

## 2023-10-24 VITALS — BP 114/76 | HR 85 | Ht 66.5 in | Wt 261.1 lb

## 2023-10-24 DIAGNOSIS — R0602 Shortness of breath: Secondary | ICD-10-CM

## 2023-10-24 DIAGNOSIS — R600 Localized edema: Secondary | ICD-10-CM

## 2023-10-24 DIAGNOSIS — Z77120 Contact with and (suspected) exposure to mold (toxic): Secondary | ICD-10-CM | POA: Diagnosis not present

## 2023-10-24 DIAGNOSIS — E7849 Other hyperlipidemia: Secondary | ICD-10-CM

## 2023-10-24 DIAGNOSIS — M278 Other specified diseases of jaws: Secondary | ICD-10-CM | POA: Diagnosis not present

## 2023-10-24 DIAGNOSIS — E559 Vitamin D deficiency, unspecified: Secondary | ICD-10-CM

## 2023-10-24 DIAGNOSIS — R7301 Impaired fasting glucose: Secondary | ICD-10-CM

## 2023-10-24 DIAGNOSIS — E038 Other specified hypothyroidism: Secondary | ICD-10-CM

## 2023-10-24 NOTE — Progress Notes (Signed)
 Established Patient Office Visit  Subjective:  Patient ID: Adriana [name], female    DOB: Nov 25, 1960  Age: 63 y.o. MRN: [id number]  CC:  Chief Complaint  Patient presents with   Medical Management of Chronic Issues    Review back x rays Swelling in ankles  Pt. Reports a knot on her left jaw her dentist states it is not in her saliva gland.  Pt is experiencing severe shortness of breath, her balance is off. She is reporting that she has mold in there home and she wants to know if there is a test to see if she has an allergy .     HPI Adriana [name] is a 63 y.o. female with past medical history of prediabetes, anxiety, vit d deficiency presents for f/u of  chronic medical conditions with the above complaints.     [employer] detention 12 hours third shift 3 days 5 days off every other weekend   Left lower legs Past Medical History:  Diagnosis Date   Anemia    history   Anxiety    Benign positional vertigo 12/19/2013   Depression    Dyspnea    with exertion   Edema    Endometrial polyp    Fibroids    GERD (gastroesophageal reflux disease)    Insomnia    Internal and external hemorrhoids without complication    Migraine    Plantar fasciitis    PMB (postmenopausal bleeding)    Prediabetes    Rectal bleeding    Reflux     Past Surgical History:  Procedure Laterality Date   COLONOSCOPY     DILATATION & CURETTAGE/HYSTEROSCOPY WITH MYOSURE N/A 07/23/2017   Procedure: DILATATION & CURETTAGE/HYSTEROSCOPY WITH MYOSURE;  Surgeon: Wanita Gutta, MD;  Location: [facility] [place];  Service: Gynecology;  Laterality: N/A;  endometrial polyp   DILATION AND CURETTAGE OF UTERUS     HYSTEROSCOPY     None      Family History  Problem Relation Age of Onset   Colon cancer Father        diagnosed in late 70s.    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    Social Drivers of Corporate investment banker Strain: Low Risk  (03/27/2022)   Overall Financial Resource Strain (CARDIA)    Difficulty of Paying Living Expenses: Not hard at all  Food Insecurity:  No Food Insecurity (03/27/2022)   Hunger Vital Sign    Worried About Running Out of Food in the Last Year: Never true    Ran Out of Food in the Last Year: Never true  Transportation Needs: No Transportation Needs (03/27/2022)   PRAPARE - Administrator, Civil Service (Medical): No    Lack of Transportation (Non-Medical): No  Physical Activity: Inactive (03/27/2022)   Exercise Vital Sign    Days of Exercise per Week: 0 days    Minutes of Exercise per Session: 0 min  Stress: Stress Concern Present (03/27/2022)   [employer] of Occupational Health - Occupational Stress Questionnaire    Feeling of Stress : To some extent  Social Connections: Moderately Integrated (03/27/2022)   Social Connection and Isolation Panel [NHANES]    Frequency of Communication with Friends and Family: More than three times a week    Frequency of Social Gatherings with Friends and Family: More than three times a week    Attends Religious  Services: More than 4 times per year    Active Member of [employer] or Organizations: Yes    Attends Engineer, structural: More than 4 times per year    Marital Status: Divorced  Intimate Partner Violence: Not At Risk (03/27/2022)   Humiliation, Afraid, Rape, and Kick questionnaire    Fear of Current or Ex-Partner: No    Emotionally Abused: No    Physically Abused: No    Sexually Abused: No    Outpatient Medications Prior to Visit  Medication Sig Dispense Refill   Cholecalciferol (VITAMIN D3) 25 MCG (1000 UT) CAPS Take 1 capsule (1,000 Units total) by mouth daily. 60 capsule 3   cyclobenzaprine  (FLEXERIL ) 5 MG tablet Take 1 tablet (5 mg total) by mouth 3 (three) times daily as needed. 30 tablet 1   MELATONIN PO Take by mouth. As needed for sleep     vitamin B-12 (CYANOCOBALAMIN) 100 MCG tablet Take 100 mcg by mouth daily.     betamethasone  valerate ointment (VALISONE ) 0.1 % Apply 1 Application topically 2 (two) times daily. Use for up to 1-2 weeks as needed. (Patient not taking: Reported on 10/24/2023) 30 g 0   ciclopirox  (PENLAC ) 8 % solution Apply topically at bedtime. Apply over nail and surrounding skin. Apply daily over previous coat. Remove weekly with polish remover. (Patient not taking: Reported on 10/24/2023) 6.6 mL 11   Magnesium 400 MG TABS Take 1 tablet by mouth as needed. (Patient not taking: Reported on 10/24/2023)     Magnesium Glycinate 100 MG CAPS Take 100 mg by mouth daily as needed.     methylPREDNISolone  (MEDROL  DOSEPAK) 4 MG TBPK tablet 6 day dose pack - take as directed (Patient not taking: Reported on 10/24/2023) 21 tablet 0   metroNIDAZOLE  (METROGEL ) 0.75 % vaginal gel Place 1 Applicatorful vaginally at bedtime. Use nightly x 5 nights (Patient not taking: Reported on 10/24/2023) 70 g 0   omeprazole  (PRILOSEC) 40 MG capsule Take 1 by mouth 30 minutes before breakfast daily (Patient not taking: Reported on 10/24/2023) 30 capsule 5   omeprazole  (PRILOSEC) 40 MG capsule Take 1  capsule (40 mg total) by mouth 2 (two) times daily. (Patient not taking: Reported on 10/24/2023) 90 capsule 3   ondansetron  (ZOFRAN ) 4 MG tablet Take 1 tablet (4 mg total) by mouth as directed. Take one Zofran  4 mg tablet 30-60 minutes before each colonoscopy prep dose (Patient not taking: Reported on 10/24/2023) 4 tablet 0   traZODone  (  DESYREL ) 50 MG tablet TAKE 1 TABLET BY MOUTH EVERY DAY AT BEDTIME AS NEEDED (Patient not taking: Reported on 10/24/2023) 90 tablet 2   valACYclovir  (VALTREX ) 500 MG tablet Take one tablet po BID x 3 days prn (Patient not taking: Reported on 10/24/2023) 30 tablet 1   Facility-Administered Medications Prior to Visit  Medication Dose Route Frequency Provider Last Rate Last Admin   0.9 %  sodium chloride  infusion  500 mL Intravenous Once Nandigam, Kavitha V, MD        Allergies  Allergen Reactions   Hydrocodone Nausea And Vomiting    Other reaction(s): Unknown    ROS Review of Systems  Constitutional:  Negative for chills and fever.  Eyes:  Negative for visual disturbance.  Respiratory:  Negative for chest tightness and shortness of breath.   Neurological:  Negative for dizziness and headaches.      Objective:     Physical Exam HENT:     Head: Normocephalic.     Comments: Left jaw mass palpated     Mouth/Throat:     Mouth: Mucous membranes are moist.  Cardiovascular:     Rate and Rhythm: Normal rate.     Heart sounds: Normal heart sounds.  Pulmonary:     Effort: Pulmonary effort is normal.     Breath sounds: Normal breath sounds.  Neurological:     Mental Status: She is alert.     BP 114/76   Pulse 85   Ht 5' 6.5" (1.689 m)   Wt 261 lb 1.9 oz (118.4 kg)   LMP 01/16/2014   SpO2 96%   BMI 41.51 kg/m  Wt Readings from Last 3 Encounters:  10/24/23 261 lb 1.9 oz (118.4 kg)  05/13/23 258 lb (117 kg)  05/06/23 156 lb (70.8 kg)    Lab Results  Component Value Date   TSH 2.240 05/15/2023   Lab Results  Component Value Date   WBC 4.3  05/15/2023   HGB 12.6 05/15/2023   HCT 39.4 05/15/2023   MCV 94 05/15/2023   PLT 208 05/15/2023   Lab Results  Component Value Date   NA 143 05/15/2023   K 4.1 05/15/2023   CO2 23 05/15/2023   GLUCOSE 94 05/15/2023   BUN 11 05/15/2023   CREATININE 0.79 05/15/2023   BILITOT 0.2 08/17/2021   ALKPHOS 80 08/17/2021   AST 15 08/17/2021   ALT 11 08/17/2021   PROT 7.2 08/17/2021   ALBUMIN 4.6 08/17/2021   CALCIUM 9.2 05/15/2023   ANIONGAP 8 02/12/2019   EGFR 86 05/15/2023   Lab Results  Component Value Date   CHOL 198 05/15/2023   Lab Results  Component Value Date   HDL 53 05/15/2023   Lab Results  Component Value Date   LDLCALC 127 (H) 05/15/2023   Lab Results  Component Value Date   TRIG 100 05/15/2023   Lab Results  Component Value Date   CHOLHDL 3.7 05/15/2023   Lab Results  Component Value Date   HGBA1C 5.9 (H) 05/15/2023      Assessment & Plan:  Bilateral lower extremity edema Assessment & Plan: Recommended to elevate the legs daily for 20-30 minutes above heart level to promote venous return and reduce swelling.  Compression stockings should be worn daily, and regular physical activity and weight reduction are recommended as part of conservative management.  Recommended avoiding prolonged sitting or standing, as these can worsen symptoms of swelling and discomfort.   Jaw mass Assessment & Plan: The jaw mass  will be further evaluated with imaging--preferably a CT scan--to provide detailed information regarding its size, location, and characteristics, as well as its relationship to adjacent structures. The patient is advised to continue monitoring for any concerning changes, such as increased pain, growth in size, fever, or unintentional weight loss, and to report these symptoms promptl   Orders: -     CT MAXILLOFACIAL W CONTRAST  Mold suspected exposure Assessment & Plan: Referral placed to allergy  Orders: -     Ambulatory referral to  Allergy  DYSPNEA Assessment & Plan: There is low suspicion of a cardiac-related cause for the patient's symptoms. The reported shortness of breath is most likely due to physical deconditioning.  Recommendations: To improve cardiopulmonary fitness and reduce dyspnea related to deconditioning, the following lifestyle modifications are advised: Gradual aerobic exercise: Initiate low-impact activities such as walking, stationary cycling, or water aerobics. Begin with short sessions (5-10 minutes) and gradually increase duration and intensity as tolerated. Paced breathing techniques: Encourage diaphragmatic (belly) breathing and pursed-lip breathing to enhance respiratory efficiency, especially during exertion. Structured activity plan: Aim for at least 150 minutes of moderate-intensity aerobic activity per week, adjusting based on the patient's tolerance and progress. Weight management: Promote healthy weight loss through a combination of balanced nutrition and physical activity to reduce cardiopulmonary workload. Hydration and nutrition: Ensure adequate fluid intake and a nutrient-rich diet to support energy metabolism and tissue oxygenation. Rest and recovery: Advise incorporating rest periods between activities and avoiding overexertion to prevent symptom exacerbation.    IFG (impaired fasting glucose) -     Hemoglobin A1c  Vitamin D  deficiency -     VITAMIN D  25 Hydroxy (Vit-D Deficiency, Fractures)  TSH (thyroid -stimulating hormone deficiency) -     TSH + free T4  Other hyperlipidemia -     Lipid panel -     CMP14+EGFR -     CBC with Differential/Platelet  Note: This chart has been completed using Engineer, civil (consulting) software, and while attempts have been made to ensure accuracy, certain words and phrases may not be transcribed as intended.    Follow-up: Return in about 5 months (around 03/25/2024).   Myeshia Fojtik, FNP

## 2023-10-24 NOTE — Patient Instructions (Addendum)
 I appreciate the opportunity to provide care to you today!    Follow up:  5 months  Labs: please stop by the lab during the week to get your blood drawn (CBC, CMP, TSH, Lipid profile, HgA1c, Vit D)  Nonpharmacological Interventions for Shortness of Breath Due to Deconditioning:  I recommend  lifestyle modifications to improve cardiopulmonary fitness and reduce shortness of breath attributed to physical deconditioning. Recommendations include:  Gradual aerobic exercise: Start with low-impact activities such as walking, stationary cycling, or water aerobics. Begin with short durations (5-10 minutes) and increase as tolerated.  Paced breathing techniques: Practice diaphragmatic (belly) breathing and pursed-lip breathing to improve respiratory efficiency during exertion.  Structured activity plan: Incorporate moderate-intensity activity for at least 150 minutes per week, as tolerated, to improve cardiovascular endurance.  Weight management (if applicable): Healthy weight loss through diet and exercise can reduce strain on the lungs and heart.  Hydration and nutrition: Maintain adequate fluid intake and a balanced diet to support energy levels and tissue oxygenation.  Rest and recovery: Allow sufficient rest between physical activities and avoid overexertion to prevent worsening symptoms.  Jaw Mass: The jaw mass will be further evaluated with imaging--preferably a CT scan. This will help provide detailed information regarding the size, location, and characteristics of the mass, as well as its relationship to adjacent structures. Please continue monitoring for any concerning changes, including pain, increase in size, fever, or unintentional weight loss, and to report these symptoms promptly.  Left Lower Extremity Swelling: I recommend to elevate the legs daily for 20-30 minutes above heart level to promote venous return and reduce swelling.  Compression stockings should be worn daily, and  regular physical activity and weight reduction are recommended as part of conservative management.  I recommend avoiding prolonged sitting or standing, as these can worsen symptoms of swelling and discomfort.     Referral: allergy   Please stop by your local pharmacy and get your Tdap and Shingles vaccine  Referrals today-   Attached with your AVS, you will find valuable resources for self-education. I highly recommend dedicating some time to thoroughly examine them.   Please continue to a heart-healthy diet and increase your physical activities. Try to exercise for at least five days a week.    It was a pleasure to see you and I look forward to continuing to work together on your health and well-being. Please do not hesitate to call the office if you need care or have questions about your care.  In case of emergency, please visit the Emergency Department for urgent care, or contact our clinic at [phone] to schedule an appointment. We're here to help you!   Have a wonderful day and week. With Gratitude, Azad Calame MSN, FNP-BC

## 2023-10-29 DIAGNOSIS — Z77120 Contact with and (suspected) exposure to mold (toxic): Secondary | ICD-10-CM | POA: Insufficient documentation

## 2023-10-29 DIAGNOSIS — M278 Other specified diseases of jaws: Secondary | ICD-10-CM | POA: Insufficient documentation

## 2023-10-29 NOTE — Assessment & Plan Note (Signed)
 Recommended to elevate the legs daily for 20-30 minutes above heart level to promote venous return and reduce swelling.  Compression stockings should be worn daily, and regular physical activity and weight reduction are recommended as part of conservative management.  Recommended avoiding prolonged sitting or standing, as these can worsen symptoms of swelling and discomfort.

## 2023-10-29 NOTE — Assessment & Plan Note (Signed)
 The jaw mass will be further evaluated with imaging--preferably a CT scan--to provide detailed information regarding its size, location, and characteristics, as well as its relationship to adjacent structures. The patient is advised to continue monitoring for any concerning changes, such as increased pain, growth in size, fever, or unintentional weight loss, and to report these symptoms promptl

## 2023-10-29 NOTE — Assessment & Plan Note (Signed)
Referral placed to allergy

## 2023-10-29 NOTE — Assessment & Plan Note (Signed)
 There is low suspicion of a cardiac-related cause for the patient's symptoms. The reported shortness of breath is most likely due to physical deconditioning.  Recommendations: To improve cardiopulmonary fitness and reduce dyspnea related to deconditioning, the following lifestyle modifications are advised: Gradual aerobic exercise: Initiate low-impact activities such as walking, stationary cycling, or water aerobics. Begin with short sessions (5-10 minutes) and gradually increase duration and intensity as tolerated. Paced breathing techniques: Encourage diaphragmatic (belly) breathing and pursed-lip breathing to enhance respiratory efficiency, especially during exertion. Structured activity plan: Aim for at least 150 minutes of moderate-intensity aerobic activity per week, adjusting based on the patient's tolerance and progress. Weight management: Promote healthy weight loss through a combination of balanced nutrition and physical activity to reduce cardiopulmonary workload. Hydration and nutrition: Ensure adequate fluid intake and a nutrient-rich diet to support energy metabolism and tissue oxygenation. Rest and recovery: Advise incorporating rest periods between activities and avoiding overexertion to prevent symptom exacerbation.

## 2023-10-30 ENCOUNTER — Ambulatory Visit: Payer: 59 | Admitting: Dermatology

## 2023-11-06 ENCOUNTER — Encounter: Payer: Self-pay | Admitting: Podiatry

## 2023-11-06 ENCOUNTER — Ambulatory Visit: Admitting: Podiatry

## 2023-11-06 DIAGNOSIS — Q666 Other congenital valgus deformities of feet: Secondary | ICD-10-CM | POA: Diagnosis not present

## 2023-11-06 DIAGNOSIS — M7661 Achilles tendinitis, right leg: Secondary | ICD-10-CM

## 2023-11-06 NOTE — Progress Notes (Signed)
 Subjective:  Patient ID: Adriana [name], female    DOB: 08/17/1960,  MRN: [id number]  Chief Complaint  Patient presents with   Heel Spurs    RM 13 Bilateral heel spur (Pt is more concerned about the right heel-more painful )and to talk about surgery (pt of Dr. Wyn Heater). Pt is requesting copy of previous xray's and requesting a doctor's note for today.    63 y.o. female presents with the above complaint.  Patient presents with complaint of right Achilles tendinitis insertional pain.  Patient states is painful to touch has progressed gotten worse worse with ambulation or shoe pressure would like to discuss treatment options for this.  She states the right side is the worst side.  She does not wear any orthotics.  She has not been cam boot immobilized.  Pain scale 7 out of 10 dull aching nature.  Patient is going to [place] in 3 weeks.   Review of Systems: Negative except as noted in the HPI. Denies N/V/F/Ch.  Past Medical History:  Diagnosis Date   Anemia    history   Anxiety    Benign positional vertigo 12/19/2013   Depression    Dyspnea    with exertion   Edema    Endometrial polyp    Fibroids    GERD (gastroesophageal reflux disease)    Insomnia    Internal and external hemorrhoids without complication    Migraine    Plantar fasciitis    PMB (postmenopausal bleeding)    Prediabetes    Rectal bleeding    Reflux     Current Outpatient Medications:    betamethasone  valerate ointment (VALISONE ) 0.1 %, Apply 1 Application topically 2 (two) times daily. Use for up to 1-2 weeks as needed. (Patient not taking: Reported on 10/24/2023), Disp: 30 g, Rfl: 0   Cholecalciferol (VITAMIN D3) 25 MCG (1000 UT) CAPS, Take 1 capsule (1,000 Units total) by mouth daily., Disp: 60 capsule, Rfl: 3   ciclopirox  (PENLAC ) 8 % solution, Apply topically at bedtime. Apply over nail and surrounding skin. Apply daily over previous coat. Remove weekly with polish remover. (Patient not taking: Reported on  10/24/2023), Disp: 6.6 mL, Rfl: 11   cyclobenzaprine  (FLEXERIL ) 5 MG tablet, Take 1 tablet (5 mg total) by mouth 3 (three) times daily as needed., Disp: 30 tablet, Rfl: 1   Magnesium 400 MG TABS, Take 1 tablet by mouth as needed. (Patient not taking: Reported on 10/24/2023), Disp: , Rfl:    Magnesium Glycinate 100 MG CAPS, Take 100 mg by mouth daily as needed., Disp: , Rfl:    MELATONIN PO, Take by mouth. As needed for sleep, Disp: , Rfl:    methylPREDNISolone  (MEDROL  DOSEPAK) 4 MG TBPK tablet, 6 day dose pack - take as directed (Patient not taking: Reported on 10/24/2023), Disp: 21 tablet, Rfl: 0   metroNIDAZOLE  (METROGEL ) 0.75 % vaginal gel, Place 1 Applicatorful vaginally at bedtime. Use nightly x 5 nights (Patient not taking: Reported on 10/24/2023), Disp: 70 g, Rfl: 0   omeprazole  (PRILOSEC) 40 MG capsule, Take 1 by mouth 30 minutes before breakfast daily (Patient not taking: Reported on 10/24/2023), Disp: 30 capsule, Rfl: 5   omeprazole  (PRILOSEC) 40 MG capsule, Take 1 capsule (40 mg total) by mouth 2 (two) times daily. (Patient not taking: Reported on 10/24/2023), Disp: 90 capsule, Rfl: 3   ondansetron  (ZOFRAN ) 4 MG tablet, Take 1 tablet (4 mg total) by mouth as directed. Take one Zofran  4 mg tablet 30-60 minutes before each colonoscopy  prep dose (Patient not taking: Reported on 10/24/2023), Disp: 4 tablet, Rfl: 0   traZODone  (DESYREL ) 50 MG tablet, TAKE 1 TABLET BY MOUTH EVERY DAY AT BEDTIME AS NEEDED (Patient not taking: Reported on 10/24/2023), Disp: 90 tablet, Rfl: 2   valACYclovir  (VALTREX ) 500 MG tablet, Take one tablet po BID x 3 days prn (Patient not taking: Reported on 10/24/2023), Disp: 30 tablet, Rfl: 1   vitamin B-12 (CYANOCOBALAMIN) 100 MCG tablet, Take 100 mcg by mouth daily., Disp: , Rfl:   Current Facility-Administered Medications:    0.9 %  sodium chloride  infusion, 500 mL, Intravenous, Once, Nandigam, Kavitha V, MD  Social History   Tobacco Use  Smoking Status Never   Passive exposure:  Never  Smokeless Tobacco Never    Allergies  Allergen Reactions   Hydrocodone Nausea And Vomiting    Other reaction(s): Unknown   Objective:  There were no vitals filed for this visit. There is no height or weight on file to calculate BMI. Constitutional Well developed. Well nourished.  Vascular Dorsalis pedis pulses palpable bilaterally. Posterior tibial pulses palpable bilaterally. Capillary refill normal to all digits.  No cyanosis or clubbing noted. Pedal hair growth normal.  Neurologic Normal speech. Oriented to person, place, and time. Epicritic sensation to light touch grossly present bilaterally.  Dermatologic Nails well groomed and normal in appearance. No open wounds. No skin lesions.  Orthopedic: Pain on palpation of right Achilles tendon insertion pain with dorsiflexion of the ankle joint no pain with plantarflexion of the ankle joint.  Positive Silfverskiold test noted with gastrocnemius equinus positive Haglund's deformity noted.   Radiographs: None Assessment:   1. Achilles tendinitis, right leg   2. Pes planovalgus    Plan:  Patient was evaluated and treated and all questions answered.  Right Achilles tendinitis insertional pain - All questions and concerns were discussed with the patient in extensive detail given the amount of pain that she is experiencing should benefit from cam boot immobilization to allow the soft tissue structure to heal appropriately.  Patient agrees with plan elected proceed with pain with immobilization - Cam boot was dispensed   Pes planovalgus -I explained to patient the etiology of pes planovalgus and relationship with Planter fasciitis and various treatment options were discussed.  Given patient foot structure in the setting of Planter fasciitis I believe patient will benefit from custom-made orthotics to help control the hindfoot motion support the arch of the foot and take the stress away from plantar fascial.  Patient agrees  with the plan like to proceed with orthotics -Patient was casted for orthotics with quarter inch heel lift bilaterally

## 2023-11-12 ENCOUNTER — Telehealth: Payer: Self-pay | Admitting: Podiatry

## 2023-11-12 NOTE — Telephone Encounter (Signed)
 Patient would like to receive note for work stating light duty until MRI takes place. Patient contact telephone number, [phone]

## 2023-11-13 ENCOUNTER — Telehealth: Payer: Self-pay | Admitting: Podiatry

## 2023-11-13 ENCOUNTER — Encounter: Payer: Self-pay | Admitting: Podiatry

## 2023-11-13 NOTE — Telephone Encounter (Signed)
 Pt came in. 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 She asked about her boot. Does she need it on all the time. Can she take it off.. if so when can she take it off. Can she take it off at work. Thanks !

## 2023-11-19 ENCOUNTER — Other Ambulatory Visit: Payer: Self-pay | Admitting: Family Medicine

## 2023-11-19 DIAGNOSIS — B9689 Other specified bacterial agents as the cause of diseases classified elsewhere: Secondary | ICD-10-CM

## 2023-11-22 ENCOUNTER — Ambulatory Visit (HOSPITAL_COMMUNITY)

## 2023-11-26 ENCOUNTER — Telehealth: Payer: Self-pay | Admitting: Podiatry

## 2023-11-26 DIAGNOSIS — M7661 Achilles tendinitis, right leg: Secondary | ICD-10-CM

## 2023-11-26 NOTE — Telephone Encounter (Signed)
 Patient complaining that the boot does not seem to be helping, she is still in pain and is now aggravating her other foot/ leg from overcompensating. States her job has not put her on light duty even with the note. I advised she contact HR to see if she could qualify for short term disability or something similar. Did review past telephone encounters with patient and she is now requesting an order for an MRI. Can we please get one ordered for patient? Thanks

## 2023-12-03 ENCOUNTER — Ambulatory Visit
Admission: RE | Admit: 2023-12-03 | Discharge: 2023-12-03 | Disposition: A | Discharge status: Home / Self Care | Source: Ambulatory Visit | Attending: Podiatry | Admitting: Podiatry

## 2023-12-03 ENCOUNTER — Other Ambulatory Visit

## 2023-12-03 DIAGNOSIS — M7661 Achilles tendinitis, right leg: Secondary | ICD-10-CM

## 2023-12-06 ENCOUNTER — Other Ambulatory Visit

## 2023-12-10 ENCOUNTER — Telehealth: Payer: Self-pay | Admitting: Podiatry

## 2023-12-10 NOTE — Telephone Encounter (Signed)
 Patient is requesting MRI Results, Patient contact telephone number, [phone]

## 2023-12-11 ENCOUNTER — Ambulatory Visit: Admitting: Podiatry

## 2023-12-25 ENCOUNTER — Ambulatory Visit: Admitting: Allergy & Immunology

## 2024-01-08 ENCOUNTER — Ambulatory Visit: Admitting: Dermatology

## 2024-01-08 VITALS — BP 135/80

## 2024-01-08 DIAGNOSIS — L570 Actinic keratosis: Secondary | ICD-10-CM

## 2024-01-08 DIAGNOSIS — Z1283 Encounter for screening for malignant neoplasm of skin: Secondary | ICD-10-CM | POA: Diagnosis not present

## 2024-01-08 DIAGNOSIS — L578 Other skin changes due to chronic exposure to nonionizing radiation: Secondary | ICD-10-CM

## 2024-01-08 DIAGNOSIS — D1801 Hemangioma of skin and subcutaneous tissue: Secondary | ICD-10-CM

## 2024-01-08 DIAGNOSIS — D229 Melanocytic nevi, unspecified: Secondary | ICD-10-CM

## 2024-01-08 DIAGNOSIS — L814 Other melanin hyperpigmentation: Secondary | ICD-10-CM | POA: Diagnosis not present

## 2024-01-08 DIAGNOSIS — L821 Other seborrheic keratosis: Secondary | ICD-10-CM | POA: Diagnosis not present

## 2024-01-08 DIAGNOSIS — W908XXA Exposure to other nonionizing radiation, initial encounter: Secondary | ICD-10-CM

## 2024-01-08 DIAGNOSIS — L820 Inflamed seborrheic keratosis: Secondary | ICD-10-CM

## 2024-01-08 NOTE — Patient Instructions (Addendum)

## 2024-01-08 NOTE — Progress Notes (Signed)
   New Patient Visit   Subjective  Adriana [name] is a 63 y.o. female NEW PATIENT who presents for the following:   Total Body Skin Exam (TBSE)  The patient reports she has spots, moles and lesions to be evaluated, some may be new or changing and the patient may have concern these could be cancer. Patient has previously been treated by a dermatologist. Reports Hx of Bx - benign. Reports  family Hx of skin cancers (mother - BCC). Patient does apply sunscreen and/or wears protective coverings.  The following portions of the chart were reviewed this encounter and updated as appropriate: medications, allergies, medical history  Review of Systems:  No other skin or systemic complaints except as noted in HPI or Assessment and Plan.  Objective  Well appearing patient in no apparent distress; mood and affect are within normal limits.  A full examination was performed including scalp, head, eyes, ears, nose, lips, neck, chest, axillae, abdomen, back, buttocks, bilateral upper extremities, bilateral lower extremities, hands, feet, fingers, toes, fingernails, and toenails. All findings within normal limits unless otherwise noted below.    Relevant exam findings are noted in the Assessment and Plan.  Right Forehead Erythematous thin papules/macules with gritty scale.   Assessment & Plan   LENTIGINES, SEBORRHEIC KERATOSES, HEMANGIOMAS - Benign normal skin lesions - Benign-appearing - Call for any changes  BENIGN MELANOCYTIC NEVI - Tan-brown and/or pink-flesh-colored symmetric macules and papules - Benign appearing on exam today - Observation - Call clinic for new or changing moles - Recommend daily use of broad spectrum spf 30+ sunscreen to sun-exposed areas.   MILD ACTINIC DAMAGE - Chronic condition, secondary to cumulative UV/sun exposure - diffuse scaly erythematous macules with underlying dyspigmentation - Recommend daily broad spectrum sunscreen SPF 30+ to sun-exposed areas,  reapply every 2 hours as needed.  - Staying in the shade or wearing long sleeves, sun glasses (UVA+UVB protection) and wide brim hats (4-inch brim around the entire circumference of the hat) are also recommended for sun protection.  - Call for new or changing lesions.  SKIN CANCER SCREENING PERFORMED TODAY AK (ACTINIC KERATOSIS) Right Forehead Destruction of lesion - Right Forehead Complexity: simple   Destruction method: cryotherapy   Informed consent: discussed and consent obtained   Timeout:  patient name, date of birth, surgical site, and procedure verified Lesion destroyed using liquid nitrogen: Yes   Region frozen until ice ball extended beyond lesion: Yes   Outcome: patient tolerated procedure well with no complications   Post-procedure details: wound care instructions given    INFLAMED SEBORRHEIC KERATOSIS (3) Mid Back (3) Destruction of lesion - Mid Back (3) Complexity: simple   Destruction method: cryotherapy   Informed consent: discussed and consent obtained   Timeout:  patient name, date of birth, surgical site, and procedure verified Lesion destroyed using liquid nitrogen: Yes   Region frozen until ice ball extended beyond lesion: Yes   Outcome: patient tolerated procedure well with no complications   Post-procedure details: wound care instructions given     Return in about 1 year (around 01/07/2025) for TBSE.   Documentation: I have reviewed the above documentation for accuracy and completeness, and I agree with the above.  I, Jaunice Mirza Maranda, CMA, am acting as scribe for [employer], DO.   Delon Lenis, DO

## 2024-01-09 NOTE — Telephone Encounter (Signed)
 Error

## 2024-01-20 ENCOUNTER — Encounter: Payer: Self-pay | Admitting: Dermatology

## 2024-03-26 ENCOUNTER — Ambulatory Visit: Admitting: Family Medicine

## 2024-03-31 ENCOUNTER — Encounter: Payer: Self-pay | Admitting: Family Medicine

## 2025-01-11 ENCOUNTER — Ambulatory Visit: Admitting: Dermatology
